# Patient Record
Sex: Female | Born: 1955 | Race: White | Hispanic: No | Marital: Married | Smoking: Former smoker
Health system: Southern US, Community
[De-identification: ages and names within clinical notes are randomized; demographics above are authoritative.]

## PROBLEM LIST (undated history)

## (undated) DIAGNOSIS — F32A Depression, unspecified: Secondary | ICD-10-CM

## (undated) DIAGNOSIS — E119 Type 2 diabetes mellitus without complications: Secondary | ICD-10-CM

## (undated) DIAGNOSIS — K7460 Unspecified cirrhosis of liver: Secondary | ICD-10-CM

## (undated) DIAGNOSIS — M1990 Unspecified osteoarthritis, unspecified site: Secondary | ICD-10-CM

## (undated) DIAGNOSIS — F419 Anxiety disorder, unspecified: Secondary | ICD-10-CM

## (undated) DIAGNOSIS — Z9889 Other specified postprocedural states: Secondary | ICD-10-CM

## (undated) DIAGNOSIS — K219 Gastro-esophageal reflux disease without esophagitis: Secondary | ICD-10-CM

## (undated) DIAGNOSIS — E785 Hyperlipidemia, unspecified: Secondary | ICD-10-CM

## (undated) DIAGNOSIS — F329 Major depressive disorder, single episode, unspecified: Secondary | ICD-10-CM

## (undated) DIAGNOSIS — T8859XA Other complications of anesthesia, initial encounter: Secondary | ICD-10-CM

## (undated) DIAGNOSIS — R112 Nausea with vomiting, unspecified: Secondary | ICD-10-CM

## (undated) DIAGNOSIS — T4145XA Adverse effect of unspecified anesthetic, initial encounter: Secondary | ICD-10-CM

## (undated) HISTORY — DX: Major depressive disorder, single episode, unspecified: F32.9

## (undated) HISTORY — DX: Type 2 diabetes mellitus without complications: E11.9

## (undated) HISTORY — DX: Hyperlipidemia, unspecified: E78.5

## (undated) HISTORY — PX: ENDOMETRIAL ABLATION: SHX621

## (undated) HISTORY — DX: Depression, unspecified: F32.A

## (undated) HISTORY — DX: Anxiety disorder, unspecified: F41.9

---

## 2001-12-31 ENCOUNTER — Ambulatory Visit (HOSPITAL_COMMUNITY): Admission: RE | Admit: 2001-12-31 | Discharge: 2001-12-31 | Payer: Self-pay | Admitting: Family Medicine

## 2001-12-31 ENCOUNTER — Encounter: Payer: Self-pay | Admitting: Family Medicine

## 2002-01-22 ENCOUNTER — Other Ambulatory Visit: Admission: RE | Admit: 2002-01-22 | Discharge: 2002-01-22 | Payer: Self-pay | Admitting: Obstetrics & Gynecology

## 2003-01-02 ENCOUNTER — Ambulatory Visit (HOSPITAL_COMMUNITY): Admission: RE | Admit: 2003-01-02 | Discharge: 2003-01-02 | Payer: Self-pay | Admitting: Family Medicine

## 2003-01-02 ENCOUNTER — Encounter: Payer: Self-pay | Admitting: Family Medicine

## 2003-08-14 ENCOUNTER — Ambulatory Visit (HOSPITAL_COMMUNITY): Admission: RE | Admit: 2003-08-14 | Discharge: 2003-08-14 | Payer: Self-pay | Admitting: Obstetrics & Gynecology

## 2004-01-06 ENCOUNTER — Ambulatory Visit (HOSPITAL_COMMUNITY): Admission: RE | Admit: 2004-01-06 | Discharge: 2004-01-06 | Payer: Self-pay | Admitting: Family Medicine

## 2005-12-29 ENCOUNTER — Ambulatory Visit (HOSPITAL_COMMUNITY): Admission: RE | Admit: 2005-12-29 | Discharge: 2005-12-29 | Payer: Self-pay | Admitting: Family Medicine

## 2007-01-01 ENCOUNTER — Ambulatory Visit (HOSPITAL_COMMUNITY): Admission: RE | Admit: 2007-01-01 | Discharge: 2007-01-01 | Payer: Self-pay | Admitting: Family Medicine

## 2007-05-25 ENCOUNTER — Ambulatory Visit (HOSPITAL_COMMUNITY): Admission: RE | Admit: 2007-05-25 | Discharge: 2007-05-25 | Payer: Self-pay | Admitting: Family Medicine

## 2008-01-02 ENCOUNTER — Ambulatory Visit (HOSPITAL_COMMUNITY): Admission: RE | Admit: 2008-01-02 | Discharge: 2008-01-02 | Payer: Self-pay | Admitting: Family Medicine

## 2009-01-02 ENCOUNTER — Ambulatory Visit (HOSPITAL_COMMUNITY): Admission: RE | Admit: 2009-01-02 | Discharge: 2009-01-02 | Payer: Self-pay | Admitting: Family Medicine

## 2010-01-04 ENCOUNTER — Ambulatory Visit (HOSPITAL_COMMUNITY): Admission: RE | Admit: 2010-01-04 | Discharge: 2010-01-04 | Payer: Self-pay | Admitting: Family Medicine

## 2010-01-13 ENCOUNTER — Ambulatory Visit (HOSPITAL_COMMUNITY): Admission: RE | Admit: 2010-01-13 | Discharge: 2010-01-13 | Payer: Self-pay | Admitting: Family Medicine

## 2010-07-19 ENCOUNTER — Ambulatory Visit: Payer: Self-pay | Admitting: Internal Medicine

## 2010-07-19 DIAGNOSIS — K625 Hemorrhage of anus and rectum: Secondary | ICD-10-CM | POA: Insufficient documentation

## 2010-08-03 ENCOUNTER — Encounter: Payer: Self-pay | Admitting: Internal Medicine

## 2010-08-09 ENCOUNTER — Ambulatory Visit: Payer: Self-pay | Admitting: Internal Medicine

## 2010-08-09 ENCOUNTER — Ambulatory Visit (HOSPITAL_COMMUNITY): Admission: RE | Admit: 2010-08-09 | Discharge: 2010-08-09 | Payer: Self-pay | Admitting: Internal Medicine

## 2010-08-09 HISTORY — PX: COLONOSCOPY: SHX174

## 2010-11-07 ENCOUNTER — Encounter: Payer: Self-pay | Admitting: Family Medicine

## 2010-11-16 NOTE — Letter (Signed)
Summary: [facility] FORM  [facility] FORM   Imported By: Sofie Rower 08/03/2010 15:51:54  _____________________________________________________________________  External Attachment:    Type:   Image     Comment:   External Document  Appended Document: [facility] FORM SAY#[id number]

## 2010-11-16 NOTE — Letter (Signed)
Summary: [facility]CS ORDER  TCS ORDER   Imported By: Sofie Rower 07/19/2010 15:19:15  _____________________________________________________________________  External Attachment:    Type:   Image     Comment:   External Document

## 2010-11-16 NOTE — Assessment & Plan Note (Signed)
Summary: blood from rectum,consult for tcs/ss   Visit Type:  Initial Consult Referring Provider:  Dr Sallee Lange Primary Care Provider:  Dr Sallee Lange  Chief Complaint:  blood from rectum.  History of Present Illness: 56 y/o caucasian female w/ rectal bleeding 2 weeks ago.  Felt wetness in undergarments "like water" bright red without clots, moderate amt, without stool.  Denies hx hemorrhoids, pruiritis, or proctalgia.  Denies diarrhea, constipation, or straining.  Never had colonoscopy.  Denies abd pain, N/V.  Occ heartburn takes omeprazole 41m daily as needed couple times per month or less worse w/ certain spicy foods.  Denies dysphagia or odynophagia.  Wt stable.  Appetite normal.  Denies ASA or NSAIDS.  Current Problems (verified): 1)  Rectal Bleeding  (ICD-569.3)  Current Medications (verified): 1)  Zyrtec Allergy 10 Mg Tabs (Cetirizine Hcl) .... Once Daily 2)  Paxil 30 Mg Tabs (Paroxetine Hcl) .... Once Daily 3)  Simvastatin 20 Mg Tabs (Simvastatin) .... Once Daily 4)  Omeprazole 20 Mg Cpdr (Omeprazole) .... Once Daily  Allergies (verified): 1)  ! Pcn 2)  ! Codeine 3)  Demerol  Past History:  Past Medical History: Hyperlipidemia Depression  Past Surgical History: endometrial ablation benign breast bx Tubal Ligation  Family History: No known family history of colorectal carcinoma, IBD, liver or chronic GI problems in 1st degree relatives.  Paternal grandmother w/ gastric CA ? age.  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 20 pkyr hx, quit 24 yrs ago Alcohol Use - no Illicit Drug Use - no Daily Caffeine Use Patient does not get regular exercise.  Smoking Status:  quit Drug Use:  no Does Patient Exercise:  no  Review of Systems General:  Denies fever, chills,  sweats, anorexia, fatigue, weakness, malaise, weight loss, and sleep disorder. CV:  Denies chest pains, angina, palpitations, syncope, dyspnea on exertion, orthopnea, PND, peripheral edema, and claudication. Resp:  Denies dyspnea at rest, dyspnea with exercise, cough, sputum, wheezing, coughing up blood, and pleurisy. GI:  Denies difficulty swallowing, pain on swallowing, gas/bloating, black BMs, and fecal incontinence. GU:  Complains of urinary incontinence; denies urinary burning, blood in urine, nocturnal urination, and urinary frequency; stress. MS:  Denies joint pain / LOM, joint swelling, joint stiffness, joint deformity, low back pain, muscle weakness, muscle cramps, muscle atrophy, leg pain at night, leg pain with exertion, and shoulder pain / LOM hand / wrist pain (CTS). Derm:  Complains of rash; denies itching, dry skin, hives, moles, warts, and unhealing ulcers; left underarm x 6 weeks being treated by CPearson ForsterNP. Psych:  Denies depression, anxiety, memory loss, suicidal ideation, hallucinations, paranoia, phobia, and confusion. Heme:  Denies bruising, bleeding, and enlarged lymph nodes.  Vital Signs:  Patient profile:   55year old female Height:      67 inches Weight:      197 pounds BMI:     30.97 Temp:     99.0 degrees F oral Pulse rate:   76 / minute BP sitting:   112 / 84  (left arm) Cuff size:   regular  Vitals Entered By: JBurnadette PeterLPN (October  3, 254272:45 PM)  Physical Exam  General:  Well developed, well nourished, no acute distress. Head:  Normocephalic and atraumatic. Eyes:  Sclera clear, no icterus. Ears:  Normal auditory  acuity. Nose:  No deformity, discharge,  or lesions. Mouth:  No deformity or lesions, dentition normal. Neck:  Supple; no masses or thyromegaly.  Erythematous raised rash right cervical neck.  No exudates. Lungs:  Clear throughout to auscultation. Heart:  Regular rate and rhythm; no murmurs, rubs,  or bruits. Abdomen:  Soft,  nontender and nondistended. No masses, hepatosplenomegaly or hernias noted. Normal bowel sounds.without guarding and without rebound.   Rectal:  deferred until time of colonoscopy.   Msk:  Symmetrical with no gross deformities. Normal posture. Pulses:  Normal pulses noted. Extremities:  No clubbing, cyanosis, edema or deformities noted. Neurologic:  Alert and  oriented x4;  grossly normal neurologically. Skin:  Intact without significant lesions or rashes. Cervical Nodes:  No significant cervical adenopathy. Psych:  Alert and cooperative. Normal mood and affect.  Impression & Recommendations:  Problem # 1:  RECTAL BLEEDING (ICD-569.3) 55 y/o caucasian female w/ painless rectal bleeding.  Will need colonoscopy to determine etiology of bleeding.  DIfferentials include colorectal CA, polyps, diverticular bleeding, or benign anorectal source.  ***Pt has NAUSEA with DEMEROL***  Diagnostic colonoscopy to be performed by Dr. Otelia Limes Rourk in the near future.  I have discussed risks and benefits which include, but are not limited to, bleeding, infection, perforation, or medication reaction.  The patient agrees with this plan and consent will be obtained.  Orders: Consultation Level III [id number])

## 2010-12-29 LAB — HEMOGLOBIN AND HEMATOCRIT, BLOOD: Hemoglobin: 13.9 g/dL (ref 12.0–15.0)

## 2011-01-03 ENCOUNTER — Other Ambulatory Visit (HOSPITAL_COMMUNITY): Payer: Self-pay | Admitting: Family Medicine

## 2011-01-03 DIAGNOSIS — N6009 Solitary cyst of unspecified breast: Secondary | ICD-10-CM

## 2011-01-03 DIAGNOSIS — Z139 Encounter for screening, unspecified: Secondary | ICD-10-CM

## 2011-01-19 ENCOUNTER — Ambulatory Visit (HOSPITAL_COMMUNITY)
Admission: RE | Admit: 2011-01-19 | Discharge: 2011-01-19 | Disposition: A | Discharge status: Home / Self Care | Payer: BC Managed Care – PPO | Source: Ambulatory Visit | Attending: Family Medicine | Admitting: Family Medicine

## 2011-01-19 DIAGNOSIS — N6009 Solitary cyst of unspecified breast: Secondary | ICD-10-CM

## 2011-01-19 DIAGNOSIS — Z09 Encounter for follow-up examination after completed treatment for conditions other than malignant neoplasm: Secondary | ICD-10-CM | POA: Insufficient documentation

## 2011-01-19 DIAGNOSIS — N63 Unspecified lump in unspecified breast: Secondary | ICD-10-CM | POA: Insufficient documentation

## 2011-05-06 ENCOUNTER — Other Ambulatory Visit: Payer: Self-pay | Admitting: Nurse Practitioner

## 2011-05-06 DIAGNOSIS — R609 Edema, unspecified: Secondary | ICD-10-CM

## 2011-05-06 DIAGNOSIS — R52 Pain, unspecified: Secondary | ICD-10-CM

## 2011-05-10 ENCOUNTER — Ambulatory Visit (HOSPITAL_COMMUNITY)
Admission: RE | Admit: 2011-05-10 | Discharge: 2011-05-10 | Disposition: A | Discharge status: Home / Self Care | Payer: BC Managed Care – PPO | Source: Ambulatory Visit | Attending: Nurse Practitioner | Admitting: Nurse Practitioner

## 2011-05-10 ENCOUNTER — Other Ambulatory Visit: Payer: Self-pay | Admitting: Nurse Practitioner

## 2011-05-10 DIAGNOSIS — R599 Enlarged lymph nodes, unspecified: Secondary | ICD-10-CM | POA: Insufficient documentation

## 2011-05-10 DIAGNOSIS — R070 Pain in throat: Secondary | ICD-10-CM | POA: Insufficient documentation

## 2011-05-10 DIAGNOSIS — R52 Pain, unspecified: Secondary | ICD-10-CM

## 2011-05-10 DIAGNOSIS — R220 Localized swelling, mass and lump, head: Secondary | ICD-10-CM | POA: Insufficient documentation

## 2011-05-10 DIAGNOSIS — R609 Edema, unspecified: Secondary | ICD-10-CM

## 2011-10-20 ENCOUNTER — Other Ambulatory Visit: Payer: Self-pay | Admitting: Family Medicine

## 2011-10-20 DIAGNOSIS — E041 Nontoxic single thyroid nodule: Secondary | ICD-10-CM

## 2011-11-11 ENCOUNTER — Ambulatory Visit (HOSPITAL_COMMUNITY)
Admission: RE | Admit: 2011-11-11 | Discharge: 2011-11-11 | Disposition: A | Discharge status: Home / Self Care | Payer: BC Managed Care – PPO | Source: Ambulatory Visit | Attending: Family Medicine | Admitting: Family Medicine

## 2011-11-11 DIAGNOSIS — E041 Nontoxic single thyroid nodule: Secondary | ICD-10-CM

## 2011-11-11 DIAGNOSIS — E049 Nontoxic goiter, unspecified: Secondary | ICD-10-CM | POA: Insufficient documentation

## 2011-11-24 ENCOUNTER — Other Ambulatory Visit: Payer: Self-pay | Admitting: Family Medicine

## 2011-11-24 DIAGNOSIS — Z139 Encounter for screening, unspecified: Secondary | ICD-10-CM

## 2012-01-23 ENCOUNTER — Ambulatory Visit (HOSPITAL_COMMUNITY)
Admission: RE | Admit: 2012-01-23 | Discharge: 2012-01-23 | Disposition: A | Discharge status: Home / Self Care | Payer: BC Managed Care – PPO | Source: Ambulatory Visit | Attending: Family Medicine | Admitting: Family Medicine

## 2012-01-23 DIAGNOSIS — Z1231 Encounter for screening mammogram for malignant neoplasm of breast: Secondary | ICD-10-CM | POA: Insufficient documentation

## 2012-01-23 DIAGNOSIS — Z139 Encounter for screening, unspecified: Secondary | ICD-10-CM

## 2012-06-11 ENCOUNTER — Other Ambulatory Visit: Payer: Self-pay | Admitting: Family Medicine

## 2012-06-11 DIAGNOSIS — R52 Pain, unspecified: Secondary | ICD-10-CM

## 2012-06-12 ENCOUNTER — Ambulatory Visit (HOSPITAL_COMMUNITY)
Admission: RE | Admit: 2012-06-12 | Discharge: 2012-06-12 | Disposition: A | Discharge status: Home / Self Care | Payer: BC Managed Care – PPO | Source: Ambulatory Visit | Attending: Family Medicine | Admitting: Family Medicine

## 2012-06-12 ENCOUNTER — Other Ambulatory Visit: Payer: Self-pay | Admitting: Family Medicine

## 2012-06-12 ENCOUNTER — Other Ambulatory Visit (HOSPITAL_COMMUNITY): Payer: BC Managed Care – PPO

## 2012-06-12 DIAGNOSIS — N854 Malposition of uterus: Secondary | ICD-10-CM | POA: Insufficient documentation

## 2012-06-12 DIAGNOSIS — M545 Low back pain, unspecified: Secondary | ICD-10-CM

## 2012-06-12 DIAGNOSIS — M51379 Other intervertebral disc degeneration, lumbosacral region without mention of lumbar back pain or lower extremity pain: Secondary | ICD-10-CM | POA: Insufficient documentation

## 2012-06-12 DIAGNOSIS — R52 Pain, unspecified: Secondary | ICD-10-CM

## 2012-06-12 DIAGNOSIS — R109 Unspecified abdominal pain: Secondary | ICD-10-CM | POA: Insufficient documentation

## 2012-06-12 DIAGNOSIS — M5137 Other intervertebral disc degeneration, lumbosacral region: Secondary | ICD-10-CM | POA: Insufficient documentation

## 2013-01-03 ENCOUNTER — Other Ambulatory Visit: Payer: Self-pay | Admitting: Family Medicine

## 2013-01-03 DIAGNOSIS — Z139 Encounter for screening, unspecified: Secondary | ICD-10-CM

## 2013-01-26 ENCOUNTER — Other Ambulatory Visit: Payer: Self-pay | Admitting: Nurse Practitioner

## 2013-01-28 ENCOUNTER — Ambulatory Visit (HOSPITAL_COMMUNITY)
Admission: RE | Admit: 2013-01-28 | Discharge: 2013-01-28 | Disposition: A | Discharge status: Home / Self Care | Payer: BC Managed Care – PPO | Source: Ambulatory Visit | Attending: Family Medicine | Admitting: Family Medicine

## 2013-01-28 DIAGNOSIS — Z139 Encounter for screening, unspecified: Secondary | ICD-10-CM

## 2013-01-28 DIAGNOSIS — Z1231 Encounter for screening mammogram for malignant neoplasm of breast: Secondary | ICD-10-CM | POA: Insufficient documentation

## 2013-02-01 ENCOUNTER — Encounter: Payer: Self-pay | Admitting: *Deleted

## 2013-02-11 ENCOUNTER — Encounter: Payer: Self-pay | Admitting: Nurse Practitioner

## 2013-02-14 ENCOUNTER — Ambulatory Visit (INDEPENDENT_AMBULATORY_CARE_PROVIDER_SITE_OTHER): Payer: BC Managed Care – PPO | Admitting: Nurse Practitioner

## 2013-02-14 ENCOUNTER — Telehealth: Payer: Self-pay | Admitting: *Deleted

## 2013-02-14 ENCOUNTER — Encounter: Payer: Self-pay | Admitting: Nurse Practitioner

## 2013-02-14 VITALS — BP 140/78 | HR 70 | Ht 64.0 in | Wt 194.0 lb

## 2013-02-14 DIAGNOSIS — T148XXA Other injury of unspecified body region, initial encounter: Secondary | ICD-10-CM

## 2013-02-14 DIAGNOSIS — R32 Unspecified urinary incontinence: Secondary | ICD-10-CM

## 2013-02-14 DIAGNOSIS — Z01419 Encounter for gynecological examination (general) (routine) without abnormal findings: Secondary | ICD-10-CM

## 2013-02-14 DIAGNOSIS — A499 Bacterial infection, unspecified: Secondary | ICD-10-CM

## 2013-02-14 DIAGNOSIS — E785 Hyperlipidemia, unspecified: Secondary | ICD-10-CM

## 2013-02-14 DIAGNOSIS — Z0000 Encounter for general adult medical examination without abnormal findings: Secondary | ICD-10-CM

## 2013-02-14 DIAGNOSIS — R7301 Impaired fasting glucose: Secondary | ICD-10-CM

## 2013-02-14 DIAGNOSIS — Z79899 Other long term (current) drug therapy: Secondary | ICD-10-CM

## 2013-02-14 DIAGNOSIS — N760 Acute vaginitis: Secondary | ICD-10-CM

## 2013-02-14 DIAGNOSIS — B9689 Other specified bacterial agents as the cause of diseases classified elsewhere: Secondary | ICD-10-CM

## 2013-02-14 MED ORDER — PAROXETINE HCL 40 MG PO TABS: 40.0000 mg | ORAL_TABLET | ORAL | Status: DC

## 2013-02-14 MED ORDER — METRONIDAZOLE 500 MG PO TABS: ORAL_TABLET | ORAL | Status: DC

## 2013-02-14 MED ORDER — CEPHALEXIN 500 MG PO CAPS: 500.0000 mg | ORAL_CAPSULE | Freq: Three times a day (TID) | ORAL | Status: AC

## 2013-02-14 NOTE — Patient Instructions (Signed)
Zaditor eye drops; Claritin in AM Benadryl PM; Nasacort AQ

## 2013-02-14 NOTE — Telephone Encounter (Signed)
Called pt number, no answer

## 2013-02-15 ENCOUNTER — Encounter: Payer: Self-pay | Admitting: Nurse Practitioner

## 2013-02-15 DIAGNOSIS — N3946 Mixed incontinence: Secondary | ICD-10-CM | POA: Insufficient documentation

## 2013-02-15 DIAGNOSIS — E785 Hyperlipidemia, unspecified: Secondary | ICD-10-CM | POA: Insufficient documentation

## 2013-02-15 DIAGNOSIS — R32 Unspecified urinary incontinence: Secondary | ICD-10-CM | POA: Insufficient documentation

## 2013-02-15 NOTE — Progress Notes (Signed)
  Subjective:    Patient ID: Catherine Moore, female    DOB: Jun 20, 1956, 57 y.o.   MRN: [id number]  HPI presents for a wellness checkup. Patient increased her Paxil to 40 mg which has helped her anxiety and depression. Same sexual partner. No fever pelvic pain. Slight white discharge, no odor itching or burning. Gets regular eye exams and dental care. Has a slight sore area in the middle of her back for the past few days. Also has noticed occasional incontinence of urgency and stress. Does not occur all the time. Is interested in medication to help with this.    Review of Systems  Constitutional: Positive for fatigue. Negative for activity change and appetite change.  Respiratory: Negative for chest tightness and shortness of breath.   Cardiovascular: Negative for chest pain.  Gastrointestinal: Negative for abdominal pain, diarrhea and constipation.  Genitourinary: Positive for vaginal discharge. Negative for dysuria, urgency, frequency, vaginal bleeding, difficulty urinating, menstrual problem and pelvic pain.  Psychiatric/Behavioral: The patient is nervous/anxious.        Objective:   Physical Exam  Constitutional: She is oriented to person, place, and time. She appears well-developed. No distress.  HENT:  Right Ear: External ear normal.  Left Ear: External ear normal.  Mouth/Throat: Oropharynx is clear and moist.  Neck: Normal range of motion. Neck supple. No tracheal deviation present. No thyromegaly present.  Cardiovascular: Normal rate, regular rhythm and normal heart sounds.  Exam reveals no gallop.   No murmur heard. Pulmonary/Chest: Effort normal and breath sounds normal.  Abdominal: Soft. She exhibits no distension. There is no tenderness.  Genitourinary: Vagina normal and uterus normal. No vaginal discharge found.  Musculoskeletal: She exhibits no edema.  Lymphadenopathy:    She has no cervical adenopathy.  Neurological: She is alert and oriented to person, place, and time.   Skin: Skin is warm and dry. No rash noted.  Psychiatric: She has a normal mood and affect. Her behavior is normal.   breast exam normal, axilla no adenopathy. EGBUS normal limit. Vagina very minimal slightly yellowish discharge noted. No CMT. Uterus and adnexa normal limit and nontender, exam limited due to abdominal girth. Rectal exam normal limit, no stool for Hemoccult. A small slightly raised pink lesion with this tiny superficial abrasion most likely from excoriation noted in the mid back area, minimally tender to palpation. No active discharge.       Assessment & Plan:  Well woman exam  Bacterial vaginosis  Impaired fasting glucose - Plan: Basic metabolic panel, Basic metabolic panel  High risk medication use - Plan: Hepatic function panel, Hepatic function panel  Hyperlipemia - Plan: Lipid panel, Lipid panel  Superficial abrasion  Metronidazole as directed. Keflex as directed. New prescription for Paxil 40 mg daily. Lab work ordered. Recheck in a few months, call back sooner if any problems. Defers any STD testing.

## 2013-02-15 NOTE — Assessment & Plan Note (Signed)
Labs pending.  

## 2013-02-15 NOTE — Assessment & Plan Note (Addendum)
Symptoms are mild at this point. Patient wishes to try a patch. Recommend Oxytrol OTC patches as directed.

## 2013-02-15 NOTE — Assessment & Plan Note (Signed)
Continue pravastatin 80 mg daily.

## 2013-03-18 ENCOUNTER — Other Ambulatory Visit: Payer: Self-pay | Admitting: Nurse Practitioner

## 2013-03-18 ENCOUNTER — Other Ambulatory Visit: Payer: Self-pay | Admitting: Family Medicine

## 2013-03-19 NOTE — Telephone Encounter (Signed)
Ok times one. Must have chronic ov bef any furhter

## 2013-03-19 NOTE — Telephone Encounter (Signed)
Was told to return in three mo, did not, will need to be seen before further

## 2013-03-19 NOTE — Telephone Encounter (Signed)
Ok times one, must have chronic ov before any further meds rxed

## 2013-03-20 ENCOUNTER — Other Ambulatory Visit: Payer: Self-pay | Admitting: Nurse Practitioner

## 2013-03-25 ENCOUNTER — Other Ambulatory Visit: Payer: Self-pay | Admitting: Nurse Practitioner

## 2013-03-25 NOTE — Telephone Encounter (Signed)
Ok times one. Will need an o v with me specifically on this before more written

## 2013-03-25 NOTE — Telephone Encounter (Signed)
Last filled on 01/31/13 with one additional refill.

## 2013-03-28 ENCOUNTER — Other Ambulatory Visit: Payer: Self-pay | Admitting: Nurse Practitioner

## 2013-03-28 MED ORDER — HYDROCODONE-ACETAMINOPHEN 5-325 MG PO TABS: ORAL_TABLET | ORAL | Status: DC

## 2013-04-18 ENCOUNTER — Other Ambulatory Visit: Payer: Self-pay | Admitting: Family Medicine

## 2013-04-18 NOTE — Telephone Encounter (Signed)
Ok plus two monthly ref

## 2013-05-03 NOTE — Progress Notes (Signed)
Labs not done; patient has lost her insurance

## 2013-05-17 ENCOUNTER — Other Ambulatory Visit: Payer: Self-pay | Admitting: Nurse Practitioner

## 2013-07-18 ENCOUNTER — Other Ambulatory Visit: Payer: Self-pay | Admitting: Family Medicine

## 2013-07-18 NOTE — Telephone Encounter (Signed)
May ref times one. 6 mo f u with myself or carolyn in next few wks

## 2013-07-19 ENCOUNTER — Other Ambulatory Visit: Payer: Self-pay | Admitting: *Deleted

## 2013-07-19 MED ORDER — ALPRAZOLAM 1 MG PO TABS: ORAL_TABLET | ORAL | Status: DC

## 2013-08-19 ENCOUNTER — Telehealth: Payer: Self-pay | Admitting: Nurse Practitioner

## 2013-08-19 ENCOUNTER — Other Ambulatory Visit: Payer: Self-pay | Admitting: Nurse Practitioner

## 2013-08-19 MED ORDER — ALPRAZOLAM 1 MG PO TABS: ORAL_TABLET | ORAL | Status: DC

## 2013-08-19 MED ORDER — HYDROCODONE-ACETAMINOPHEN 5-325 MG PO TABS: ORAL_TABLET | ORAL | Status: DC

## 2013-08-19 NOTE — Telephone Encounter (Signed)
I will give 30 day Rx of each. I know insurance is an issue but our office policy is visit every 3 months for controlled meds.

## 2013-08-19 NOTE — Telephone Encounter (Signed)
Hydrocodone and xanax refill... Going to a new pharmacy please write both scripts

## 2013-08-19 NOTE — Telephone Encounter (Signed)
Discussed with patient. Scripts ready for pick up.

## 2013-09-09 ENCOUNTER — Other Ambulatory Visit: Payer: Self-pay | Admitting: *Deleted

## 2013-09-09 ENCOUNTER — Other Ambulatory Visit: Payer: Self-pay | Admitting: Nurse Practitioner

## 2013-09-09 MED ORDER — PAROXETINE HCL 40 MG PO TABS: 40.0000 mg | ORAL_TABLET | ORAL | Status: DC

## 2013-09-26 ENCOUNTER — Encounter: Payer: Self-pay | Admitting: Nurse Practitioner

## 2013-09-26 ENCOUNTER — Ambulatory Visit (INDEPENDENT_AMBULATORY_CARE_PROVIDER_SITE_OTHER): Payer: Self-pay | Admitting: Nurse Practitioner

## 2013-09-26 VITALS — BP 122/78 | Ht 64.0 in | Wt 204.4 lb

## 2013-09-26 DIAGNOSIS — F32A Depression, unspecified: Secondary | ICD-10-CM

## 2013-09-26 DIAGNOSIS — F329 Major depressive disorder, single episode, unspecified: Secondary | ICD-10-CM

## 2013-09-26 DIAGNOSIS — F341 Dysthymic disorder: Secondary | ICD-10-CM

## 2013-09-26 DIAGNOSIS — Z5189 Encounter for other specified aftercare: Secondary | ICD-10-CM

## 2013-09-26 DIAGNOSIS — E785 Hyperlipidemia, unspecified: Secondary | ICD-10-CM

## 2013-09-26 DIAGNOSIS — Z79899 Other long term (current) drug therapy: Secondary | ICD-10-CM

## 2013-09-26 DIAGNOSIS — K219 Gastro-esophageal reflux disease without esophagitis: Secondary | ICD-10-CM

## 2013-09-26 DIAGNOSIS — R52 Pain, unspecified: Secondary | ICD-10-CM

## 2013-09-26 MED ORDER — HYDROCODONE-ACETAMINOPHEN 5-325 MG PO TABS: ORAL_TABLET | ORAL | Status: DC

## 2013-09-26 MED ORDER — ALPRAZOLAM 1 MG PO TABS: ORAL_TABLET | ORAL | Status: DC

## 2013-09-26 MED ORDER — OMEPRAZOLE 20 MG PO CPDR: 20.0000 mg | DELAYED_RELEASE_CAPSULE | Freq: Two times a day (BID) | ORAL | Status: DC

## 2013-09-26 MED ORDER — PRAVASTATIN SODIUM 80 MG PO TABS: ORAL_TABLET | ORAL | Status: DC

## 2013-09-26 MED ORDER — PAROXETINE HCL 40 MG PO TABS: 40.0000 mg | ORAL_TABLET | ORAL | Status: DC

## 2013-09-26 NOTE — Patient Instructions (Signed)
Align Nasacort AQ

## 2013-09-27 ENCOUNTER — Encounter: Payer: Self-pay | Admitting: Nurse Practitioner

## 2013-09-27 DIAGNOSIS — R52 Pain, unspecified: Secondary | ICD-10-CM | POA: Insufficient documentation

## 2013-09-27 DIAGNOSIS — F329 Major depressive disorder, single episode, unspecified: Secondary | ICD-10-CM | POA: Insufficient documentation

## 2013-09-27 DIAGNOSIS — K219 Gastro-esophageal reflux disease without esophagitis: Secondary | ICD-10-CM | POA: Insufficient documentation

## 2013-09-27 DIAGNOSIS — F32A Depression, unspecified: Secondary | ICD-10-CM | POA: Insufficient documentation

## 2013-09-27 NOTE — Assessment & Plan Note (Signed)
.   HYDROcodone-acetaminophen (NORCO/VICODIN) 5-325 MG per tablet    Sig: TAKE ONE TABLET BY MOUTH EVERY 4 TO 6 HOURS AS NEEDED FOR PAIN.    Dispense:  45 tablet    Refill:  0    May fill 60 days from 09/26/13    Order Specific Question:  Supervising Provider    Answer:  Mikey Kirschner [[id number]]  . omeprazole (PRILOSEC) 20 MG capsule    Sig: Take 1 capsule (20 mg total) by mouth 2 (two) times daily before a meal.    Dispense:  180 capsule    Refill:  1    Order Specific Question:  Supervising Provider    Answer:  Mikey Kirschner [[id number]]  . PARoxetine (PAXIL) 40 MG tablet    Sig: Take 1 tablet (40 mg total) by mouth every morning.    Dispense:  90 tablet    Refill:  1    Order Specific Question:  Supervising Provider    Answer:  Mikey Kirschner [[id number]]  . pravastatin (PRAVACHOL) 80 MG tablet    Sig: TAKE ONE TABLET BY MOUTH ONCE DAILY FOR CHOLESTEROL    Dispense:  90 tablet    Refill:  1    Order Specific Question:  Supervising Provider    Answer:  Mikey Kirschner [[id number]]   Restart pravastatin as directed. Repeat labs in 3 months. Discussed importance of getting  LDL under good control. slowly decrease caffeine intake. Recheck in 3 months, call back sooner if any problems.

## 2013-09-27 NOTE — Assessment & Plan Note (Signed)
Continue to work on weight loss. Slowly decrease caffeine intake. Increase omeprazole 20 mg to twice a day.

## 2013-09-27 NOTE — Assessment & Plan Note (Signed)
Given 3 separate monthly prescriptions for her pain medication, given 15 more per month to cover occasional days where she has to take 2 pills. Recheck in 3 months.

## 2013-09-27 NOTE — Progress Notes (Signed)
Subjective:  Presents for routine followup. Takes hydrocodone once a day for her back and foot pain. Symptoms have been worse with her new job where she is standing all day at work. Will take a second dose several days of the week if pain is worse. Pain is 5/10 on pain scale, after medication pain is completely resolved. Uses Xanax at bedtime for sleep which is working well. Takes occasional dose for panic attacks/palpitations during the day. Drinks a lot of caffeine. Nonsmoker. No alcohol use. No excessive NSAID use. Has had 3 episodes of choking and gagging feeling like something is stuck in her throat. These were isolated  symptoms, is not a constant problem. Has noticed increase in her reflux symptoms lately. No abdominal pain. Has been working on her weight. Has had 3 episodes of urgent incontinent diarrhea over the past 3 months, no blood in her stool. No constipation. Bowels are normal in between these times. Has stopped her pravastatin. No chest pain shortness of breath or edema.  Objective:   BP 122/78  Ht 5' 4"  (1.626 m)  Wt 204 lb 6.4 oz (92.715 kg)  BMI 35.07 kg/m2 NAD. Alert, oriented. TMs minimal clear effusion, no erythema. Pharynx mildly injected, clear PND noted. Neck supple with mild soft nontender adenopathy. Lungs clear. Heart regular rate rhythm. Thyroid normal limit to palpation and nontender. Abdomen soft nondistended nontender.  Assessment:Other and unspecified hyperlipidemia - Plan: Lipid panel  High risk medication use - Plan: Hepatic function panel  Pain management  GERD (gastroesophageal reflux disease)  Anxiety and depression  Plan: Meds ordered this encounter  Medications  . ALPRAZolam (XANAX) 1 MG tablet    Sig: TAKE (1/2) TO 1 TABLET BY MOUTH TWICE DAILY AS NEEDED.    Dispense:  40 tablet    Refill:  5    May refill monthly    Order Specific Question:  Supervising Provider    Answer:  Mikey Kirschner [[id number]]  . DISCONTD: HYDROcodone-acetaminophen  (NORCO/VICODIN) 5-325 MG per tablet    Sig: TAKE ONE TABLET BY MOUTH EVERY 4 TO 6 HOURS AS NEEDED FOR PAIN.    Dispense:  45 tablet    Refill:  0    Order Specific Question:  Supervising Provider    Answer:  Mikey Kirschner [[id number]]  . DISCONTD: HYDROcodone-acetaminophen (NORCO/VICODIN) 5-325 MG per tablet    Sig: TAKE ONE TABLET BY MOUTH EVERY 4 TO 6 HOURS AS NEEDED FOR PAIN.    Dispense:  45 tablet    Refill:  0    May fill 30 days from 09/26/13    Order Specific Question:  Supervising Provider    Answer:  Mikey Kirschner [[id number]]  . HYDROcodone-acetaminophen (NORCO/VICODIN) 5-325 MG per tablet    Sig: TAKE ONE TABLET BY MOUTH EVERY 4 TO 6 HOURS AS NEEDED FOR PAIN.    Dispense:  45 tablet    Refill:  0    May fill 60 days from 09/26/13    Order Specific Question:  Supervising Provider    Answer:  Mikey Kirschner [[id number]]  . omeprazole (PRILOSEC) 20 MG capsule    Sig: Take 1 capsule (20 mg total) by mouth 2 (two) times daily before a meal.    Dispense:  180 capsule    Refill:  1    Order Specific Question:  Supervising Provider    Answer:  Mikey Kirschner [[id number]]  . PARoxetine (PAXIL) 40 MG tablet    Sig: Take 1 tablet (40 mg total)  by mouth every morning.    Dispense:  90 tablet    Refill:  1    Order Specific Question:  Supervising Provider    Answer:  Mikey Kirschner [[id number]]  . pravastatin (PRAVACHOL) 80 MG tablet    Sig: TAKE ONE TABLET BY MOUTH ONCE DAILY FOR CHOLESTEROL    Dispense:  90 tablet    Refill:  1    Order Specific Question:  Supervising Provider    Answer:  Mikey Kirschner [[id number]]   Restart pravastatin as directed. Repeat labs in 3 months. Discussed importance of getting  LDL under good control. slowly decrease caffeine intake. Recheck in 3 months, call back sooner if any problems.

## 2013-09-27 NOTE — Assessment & Plan Note (Signed)
Continue Paxil and Xanax as directed.

## 2013-12-09 ENCOUNTER — Other Ambulatory Visit: Payer: Self-pay | Admitting: Family Medicine

## 2013-12-26 ENCOUNTER — Ambulatory Visit (INDEPENDENT_AMBULATORY_CARE_PROVIDER_SITE_OTHER): Payer: BC Managed Care – PPO | Admitting: Nurse Practitioner

## 2013-12-26 ENCOUNTER — Encounter: Payer: Self-pay | Admitting: Nurse Practitioner

## 2013-12-26 VITALS — BP 122/74 | Ht 65.0 in | Wt 205.0 lb

## 2013-12-26 DIAGNOSIS — J300 Vasomotor rhinitis: Secondary | ICD-10-CM

## 2013-12-26 DIAGNOSIS — M65849 Other synovitis and tenosynovitis, unspecified hand: Secondary | ICD-10-CM

## 2013-12-26 DIAGNOSIS — R52 Pain, unspecified: Secondary | ICD-10-CM

## 2013-12-26 DIAGNOSIS — F341 Dysthymic disorder: Secondary | ICD-10-CM

## 2013-12-26 DIAGNOSIS — M779 Enthesopathy, unspecified: Secondary | ICD-10-CM

## 2013-12-26 DIAGNOSIS — J309 Allergic rhinitis, unspecified: Secondary | ICD-10-CM

## 2013-12-26 DIAGNOSIS — Z5189 Encounter for other specified aftercare: Secondary | ICD-10-CM

## 2013-12-26 DIAGNOSIS — F32A Depression, unspecified: Secondary | ICD-10-CM

## 2013-12-26 DIAGNOSIS — K219 Gastro-esophageal reflux disease without esophagitis: Secondary | ICD-10-CM

## 2013-12-26 DIAGNOSIS — M65839 Other synovitis and tenosynovitis, unspecified forearm: Secondary | ICD-10-CM

## 2013-12-26 DIAGNOSIS — F329 Major depressive disorder, single episode, unspecified: Secondary | ICD-10-CM

## 2013-12-26 DIAGNOSIS — F419 Anxiety disorder, unspecified: Secondary | ICD-10-CM

## 2013-12-26 DIAGNOSIS — M778 Other enthesopathies, not elsewhere classified: Secondary | ICD-10-CM

## 2013-12-26 DIAGNOSIS — E785 Hyperlipidemia, unspecified: Secondary | ICD-10-CM

## 2013-12-26 MED ORDER — AZITHROMYCIN 250 MG PO TABS: ORAL_TABLET | ORAL | Status: DC

## 2013-12-26 MED ORDER — PHENTERMINE HCL 37.5 MG PO TABS: 37.5000 mg | ORAL_TABLET | Freq: Every day | ORAL | Status: DC

## 2013-12-26 MED ORDER — HYDROCODONE-ACETAMINOPHEN 10-325 MG PO TABS: 1.0000 | ORAL_TABLET | Freq: Four times a day (QID) | ORAL | Status: DC | PRN

## 2013-12-26 MED ORDER — FLUTICASONE PROPIONATE 50 MCG/ACT NA SUSP: 2.0000 | Freq: Every day | NASAL | Status: DC

## 2013-12-26 MED ORDER — CETIRIZINE HCL 10 MG PO TABS: 10.0000 mg | ORAL_TABLET | Freq: Every day | ORAL | Status: DC

## 2013-12-30 ENCOUNTER — Encounter: Payer: Self-pay | Admitting: Nurse Practitioner

## 2013-12-30 NOTE — Progress Notes (Signed)
Subjective:  Presents for routine followup. Started her new job. Has to stand in one place for hours at a time. Working 40 hours per week. Has caused an increase in her chronic back pain. Hydrocodone 5 mg not working as well. Has also taken naproxen in the past. Plans to restart her walking program with improved weather. Also complaints of pain at the base of both thumbs worse with movement. Has a wrist brace at home. No weakness or numbness of the hands. Also complaints of ear pressure. No fever. Minimal cough. Mild head congestion. No sinus headache. Clear mucus. Mildly irritated throat. No wheezing. Reflux stable on omeprazole. Paxil working well for her depression and anxiety. Would like to restart phentermine to help her with her weight loss. Has been off this for more than 6 months. Has taken without difficulty in the past.  Objective:   BP 122/74  Ht 5' 5"  (1.651 m)  Wt 205 lb (92.987 kg)  BMI 34.11 kg/m2 NAD. Alert, oriented. TMs clear effusion, no erythema. Nasal mucosa pale and boggy. Pharynx injected with clear PND noted. Neck supple with mild soft anterior adenopathy. Lungs clear. Heart regular rate rhythm. Abdomen soft nondistended nontender. Tenderness noted at the base of both thumbs. Good ROM. Hand strength 5+ bilateral.  Assessment:  Problem List Items Addressed This Visit     Digestive   GERD (gastroesophageal reflux disease) - Primary     Other   Hyperlipemia   Pain management   Anxiety and depression   Morbid obesity   Relevant Medications      phentermine (ADIPEX-P) 37.5 MG tablet    Other Visit Diagnoses   Vasomotor rhinitis        Thumb tendonitis           Plan:  Meds ordered this encounter  Medications  . fluticasone (FLONASE) 50 MCG/ACT nasal spray    Sig: Place 2 sprays into both nostrils daily.    Dispense:  16 g    Refill:  11    Order Specific Question:  Supervising Provider    Answer:  Mikey Kirschner [[id number]]  . azithromycin (ZITHROMAX Z-PAK) 250  MG tablet    Sig: Take 2 tablets (500 mg) on  Day 1,  followed by 1 tablet (250 mg) once daily on Days 2 through 5.    Dispense:  6 each    Refill:  0    Order Specific Question:  Supervising Provider    Answer:  Mikey Kirschner [[id number]]  . phentermine (ADIPEX-P) 37.5 MG tablet    Sig: Take 1 tablet (37.5 mg total) by mouth daily before breakfast.    Dispense:  30 tablet    Refill:  2    Order Specific Question:  Supervising Provider    Answer:  Mikey Kirschner [[id number]]  . DISCONTD: HYDROcodone-acetaminophen (NORCO) 10-325 MG per tablet    Sig: Take 1 tablet by mouth every 6 (six) hours as needed for severe pain.    Dispense:  45 tablet    Refill:  0    Order Specific Question:  Supervising Provider    Answer:  Mikey Kirschner [[id number]]  . DISCONTD: HYDROcodone-acetaminophen (NORCO) 10-325 MG per tablet    Sig: Take 1 tablet by mouth every 6 (six) hours as needed for severe pain.    Dispense:  45 tablet    Refill:  0    May fill 30 days from 12/26/13    Order Specific Question:  Supervising Provider  Answer:  Mikey Kirschner [[id number]]  . HYDROcodone-acetaminophen (NORCO) 10-325 MG per tablet    Sig: Take 1 tablet by mouth every 6 (six) hours as needed for severe pain.    Dispense:  45 tablet    Refill:  0    May fill 60 days from 12/26/13    Order Specific Question:  Supervising Provider    Answer:  Mikey Kirschner [[id number]]  . cetirizine (ZYRTEC) 10 MG tablet    Sig: Take 1 tablet (10 mg total) by mouth daily.    Dispense:  30 tablet    Refill:  11   will increase hydrocodone to 10 mg but continue same number per month. Began walking program. Healthy diet. Phentermine for weight loss. Recommend referral to specialist for thumb tendinitis, patient given information. Recheck in 3 months, call back sooner if any problems. Routine labs to at next visit.

## 2014-01-16 ENCOUNTER — Telehealth: Payer: Self-pay | Admitting: Family Medicine

## 2014-01-16 NOTE — Telephone Encounter (Signed)
Patient called to check on her referral to Dr Nelva Bush for the shots in her hands. I didn't see a referral put in. Can you put one in?

## 2014-01-17 ENCOUNTER — Other Ambulatory Visit: Payer: Self-pay | Admitting: Nurse Practitioner

## 2014-01-17 DIAGNOSIS — M779 Enthesopathy, unspecified: Principal | ICD-10-CM

## 2014-01-17 DIAGNOSIS — M778 Other enthesopathies, not elsewhere classified: Secondary | ICD-10-CM

## 2014-01-17 NOTE — Telephone Encounter (Signed)
Notified patient via VM stating we put referral in

## 2014-01-17 NOTE — Telephone Encounter (Signed)
I apologize. Did not know we needed to do referral. Will send in request.

## 2014-03-13 ENCOUNTER — Other Ambulatory Visit: Payer: Self-pay | Admitting: Family Medicine

## 2014-03-27 ENCOUNTER — Encounter: Payer: Self-pay | Admitting: Nurse Practitioner

## 2014-03-27 ENCOUNTER — Ambulatory Visit (INDEPENDENT_AMBULATORY_CARE_PROVIDER_SITE_OTHER): Payer: BC Managed Care – PPO | Admitting: Nurse Practitioner

## 2014-03-27 VITALS — BP 146/86 | Ht 65.0 in | Wt 186.0 lb

## 2014-03-27 DIAGNOSIS — Z79899 Other long term (current) drug therapy: Secondary | ICD-10-CM

## 2014-03-27 DIAGNOSIS — F329 Major depressive disorder, single episode, unspecified: Secondary | ICD-10-CM

## 2014-03-27 DIAGNOSIS — E785 Hyperlipidemia, unspecified: Secondary | ICD-10-CM

## 2014-03-27 DIAGNOSIS — F32A Depression, unspecified: Secondary | ICD-10-CM

## 2014-03-27 DIAGNOSIS — R7301 Impaired fasting glucose: Secondary | ICD-10-CM

## 2014-03-27 DIAGNOSIS — Z5189 Encounter for other specified aftercare: Secondary | ICD-10-CM

## 2014-03-27 DIAGNOSIS — F419 Anxiety disorder, unspecified: Secondary | ICD-10-CM

## 2014-03-27 DIAGNOSIS — K219 Gastro-esophageal reflux disease without esophagitis: Secondary | ICD-10-CM

## 2014-03-27 DIAGNOSIS — F341 Dysthymic disorder: Secondary | ICD-10-CM

## 2014-03-27 DIAGNOSIS — R52 Pain, unspecified: Secondary | ICD-10-CM

## 2014-03-27 LAB — POCT GLYCOSYLATED HEMOGLOBIN (HGB A1C): Hemoglobin A1C: 5.7

## 2014-03-27 MED ORDER — PRAVASTATIN SODIUM 80 MG PO TABS: ORAL_TABLET | ORAL | Status: DC

## 2014-03-27 MED ORDER — METHOCARBAMOL 750 MG PO TABS: 750.0000 mg | ORAL_TABLET | Freq: Three times a day (TID) | ORAL | Status: DC | PRN

## 2014-03-27 MED ORDER — HYDROCODONE-ACETAMINOPHEN 10-325 MG PO TABS: 1.0000 | ORAL_TABLET | Freq: Four times a day (QID) | ORAL | Status: DC | PRN

## 2014-03-27 MED ORDER — PHENTERMINE HCL 37.5 MG PO TABS: 37.5000 mg | ORAL_TABLET | Freq: Every day | ORAL | Status: DC

## 2014-03-27 MED ORDER — PAROXETINE HCL 40 MG PO TABS: ORAL_TABLET | ORAL | Status: DC

## 2014-03-27 MED ORDER — ALPRAZOLAM 1 MG PO TABS: ORAL_TABLET | ORAL | Status: DC

## 2014-03-27 MED ORDER — OMEPRAZOLE 20 MG PO CPDR: 20.0000 mg | DELAYED_RELEASE_CAPSULE | Freq: Every day | ORAL | Status: DC

## 2014-03-27 NOTE — Patient Instructions (Addendum)
TENs unit; Icy hot smart relief Ice or heat application Massage therapy Chiropractor adjustment

## 2014-03-31 ENCOUNTER — Ambulatory Visit: Payer: BC Managed Care – PPO | Admitting: Nurse Practitioner

## 2014-04-01 ENCOUNTER — Encounter: Payer: Self-pay | Admitting: Nurse Practitioner

## 2014-04-01 NOTE — Progress Notes (Signed)
Subjective:  Presents for routine followup. Continues to have significant back and leg pain. 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 Also tilting her head slightly most of the time. Minimal walking. Chronic left knee pain. No edema. Takes daily vitamin D and calcium. Reflux stable. Has decreased her omeprazole to once a day. Continues to work on her weight. Would like to continue phentermine for 3 more months. Denies any adverse affects. Doing well with her diet. Has been under increased anxiety lately. Would like to hold on lab work due to finances. No chest pain/ischemic type pain or shortness of breath.  Objective:   BP 146/86  Ht 5' 5"  (1.651 m)  Wt 186 lb (84.369 kg)  BMI 30.95 kg/m2 NAD. Alert, oriented. Has lost 19 pounds since her previous visit. Lungs clear. Heart regular rate rhythm. Abdomen mildly obese soft nondistended nontender. Lower extremities no edema. Tight tender muscles noted along the neck and upper back area. Tenderness with palpation to the low back area. Gait normal limit.  Assessment: Problem List Items Addressed This Visit     Digestive   GERD (gastroesophageal reflux disease) - Primary   Relevant Medications      omeprazole (PRILOSEC) capsule     Endocrine   Impaired fasting glucose   Relevant Orders      POCT glycosylated hemoglobin (Hb A1C) (Completed)      Basic metabolic panel     Other   Hyperlipemia   Relevant Medications      pravastatin (PRAVACHOL) 80 MG tablet   Other Relevant Orders      Lipid panel   Pain management   Anxiety and depression   Morbid obesity   Relevant Medications      phentermine (ADIPEX-P) 37.5 MG tablet    Other Visit Diagnoses   High risk medication use        Relevant Orders       Hepatic function panel      Plan: Meds ordered this encounter  Medications  . omeprazole (PRILOSEC) 20 MG capsule    Sig: Take 1 capsule (20 mg total) by  mouth daily.    Dispense:  90 capsule    Refill:  1    Order Specific Question:  Supervising Daaiyah Baumert    Answer:  Mikey Kirschner [[id number]]  . phentermine (ADIPEX-P) 37.5 MG tablet    Sig: Take 1 tablet (37.5 mg total) by mouth daily before breakfast.    Dispense:  30 tablet    Refill:  2    Order Specific Question:  Supervising Pretty Weltman    Answer:  Mikey Kirschner [[id number]]  . pravastatin (PRAVACHOL) 80 MG tablet    Sig: TAKE ONE TABLET BY MOUTH ONCE DAILY FOR CHOLESTEROL    Dispense:  90 tablet    Refill:  1    Order Specific Question:  Supervising Kaelob Persky    Answer:  Mikey Kirschner [[id number]]  . PARoxetine (PAXIL) 40 MG tablet    Sig: TAKE 1 TABLET BY MOUTH EVERY MORNING.    Dispense:  90 tablet    Refill:  1    Order Specific Question:  Supervising Jackolyn Geron    Answer:  Mikey Kirschner [[id number]]  . DISCONTD: HYDROcodone-acetaminophen (NORCO) 10-325 MG per tablet    Sig: Take 1 tablet by mouth every 6 (six) hours as needed for severe pain.    Dispense:  45 tablet  Refill:  0    Order Specific Question:  Supervising Chidera Dearcos    Answer:  Mikey Kirschner [[id number]]  . ALPRAZolam (XANAX) 1 MG tablet    Sig: TAKE (1/2) TO 1 TABLET BY MOUTH TWICE DAILY AS NEEDED.    Dispense:  60 tablet    Refill:  5    May refill monthly    Order Specific Question:  Supervising Temitope Flammer    Answer:  Mikey Kirschner [[id number]]  . methocarbamol (ROBAXIN) 750 MG tablet    Sig: Take 1 tablet (750 mg total) by mouth every 8 (eight) hours as needed for muscle spasms.    Dispense:  30 tablet    Refill:  2    Order Specific Question:  Supervising Maveryck Bahri    Answer:  Mikey Kirschner [[id number]]  . DISCONTD: HYDROcodone-acetaminophen (NORCO) 10-325 MG per tablet    Sig: Take 1 tablet by mouth every 6 (six) hours as needed for severe pain.    Dispense:  45 tablet    Refill:  0    May refill 30 days from 03/27/14    Order Specific Question:  Supervising Anup Brigham    Answer:  Mikey Kirschner [[id number]]  .  HYDROcodone-acetaminophen (NORCO) 10-325 MG per tablet    Sig: Take 1 tablet by mouth every 6 (six) hours as needed for severe pain.    Dispense:  45 tablet    Refill:  0    May refill 60 days from 03/27/14    Order Specific Question:  Supervising Audelia Knape    Answer:  Mikey Kirschner [[id number]]   Continue same number of pain pills per month. Drowsiness precautions. Do not take with Xanax. Increase number of Xanax per month due to increased anxiety. Add Robaxin to regimen when necessary, drowsiness precautions. Ice/heat to the neck area. TENS unit. Discussed importance of stress reduction. Return in about 3 months (around 06/27/2014).

## 2014-04-07 ENCOUNTER — Other Ambulatory Visit: Payer: Self-pay | Admitting: Nurse Practitioner

## 2014-06-30 ENCOUNTER — Other Ambulatory Visit: Payer: Self-pay | Admitting: Family Medicine

## 2014-07-02 ENCOUNTER — Ambulatory Visit: Payer: BC Managed Care – PPO | Admitting: Nurse Practitioner

## 2014-07-11 ENCOUNTER — Ambulatory Visit (INDEPENDENT_AMBULATORY_CARE_PROVIDER_SITE_OTHER): Payer: BC Managed Care – PPO | Admitting: Nurse Practitioner

## 2014-07-11 ENCOUNTER — Encounter: Payer: Self-pay | Admitting: Nurse Practitioner

## 2014-07-11 VITALS — BP 140/84 | Ht 65.0 in | Wt 188.0 lb

## 2014-07-11 DIAGNOSIS — R52 Pain, unspecified: Secondary | ICD-10-CM

## 2014-07-11 DIAGNOSIS — M25562 Pain in left knee: Secondary | ICD-10-CM

## 2014-07-11 DIAGNOSIS — E785 Hyperlipidemia, unspecified: Secondary | ICD-10-CM

## 2014-07-11 DIAGNOSIS — Z5189 Encounter for other specified aftercare: Secondary | ICD-10-CM

## 2014-07-11 DIAGNOSIS — M25569 Pain in unspecified knee: Secondary | ICD-10-CM

## 2014-07-11 LAB — HEPATIC FUNCTION PANEL
ALBUMIN: 3.9 g/dL (ref 3.5–5.2)
ALK PHOS: 62 U/L (ref 39–117)
ALT: 29 U/L (ref 0–35)
AST: 23 U/L (ref 0–37)
Bilirubin, Direct: 0.1 mg/dL (ref 0.0–0.3)
Indirect Bilirubin: 0.3 mg/dL (ref 0.2–1.2)
TOTAL PROTEIN: 6.2 g/dL (ref 6.0–8.3)
Total Bilirubin: 0.4 mg/dL (ref 0.2–1.2)

## 2014-07-11 LAB — LIPID PANEL
Cholesterol: 253 mg/dL — ABNORMAL HIGH (ref 0–200)
HDL: 51 mg/dL (ref >39)
LDL CALC: 183 mg/dL — AB (ref 0–99)
TRIGLYCERIDES: 96 mg/dL (ref <150)
Total CHOL/HDL Ratio: 5 Ratio
VLDL: 19 mg/dL (ref 0–40)

## 2014-07-11 LAB — BASIC METABOLIC PANEL
BUN: 14 mg/dL (ref 6–23)
CO2: 32 meq/L (ref 19–32)
CREATININE: 0.81 mg/dL (ref 0.50–1.10)
Calcium: 9.2 mg/dL (ref 8.4–10.5)
Chloride: 105 mEq/L (ref 96–112)
Glucose, Bld: 125 mg/dL — ABNORMAL HIGH (ref 70–99)
Potassium: 4.3 mEq/L (ref 3.5–5.3)
Sodium: 145 mEq/L (ref 135–145)

## 2014-07-11 MED ORDER — HYDROCODONE-ACETAMINOPHEN 10-325 MG PO TABS: 1.0000 | ORAL_TABLET | Freq: Four times a day (QID) | ORAL | Status: DC | PRN

## 2014-07-11 NOTE — Patient Instructions (Signed)
Melatonin 5 mg for sleep

## 2014-07-13 ENCOUNTER — Encounter: Payer: Self-pay | Admitting: Nurse Practitioner

## 2014-07-13 NOTE — Progress Notes (Signed)
Subjective:  Presents for routine followup. Has been off her pravastatin due to cost but plans to restart this. Offered a less expensive alternative but patient wishes to restart pravastatin. No chest pain/ischemic type pain or shortness of breath. Gets flu vaccine at work. Continues to take hydrocodone about once a day, at most twice a day for chronic pain. Has had significant left knee pain for the past several weeks. 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 No other specific history of injury. Slight relief with a knee brace. Has swelling and pain in the medial anterior knee especially with prolonged standing at work.  Objective:   BP 140/84  Ht 5' 5"  (1.651 m)  Wt 188 lb (85.276 kg)  BMI 31.28 kg/m2 NAD. Alert, oriented. Lungs clear. Heart regular rate rhythm. Left knee mild erythema noted anterior knee especially towards the medial aspect. Tenderness noted along this area. No erythema or warmth. Passive ROM with tenderness with full flexion. No joint laxity. Minimal crepitus.  Assessment: Hyperlipemia  Pain management  Morbid obesity  Left knee pain  Plan:  Meds ordered this encounter  Medications  . DISCONTD: HYDROcodone-acetaminophen (NORCO) 10-325 MG per tablet    Sig: Take 1 tablet by mouth every 6 (six) hours as needed for severe pain.    Dispense:  45 tablet    Refill:  0    May refill 60 days from 07/11/14    Order Specific Question:  Supervising Provider    Answer:  Mikey Kirschner [[id number]]  . DISCONTD: HYDROcodone-acetaminophen (NORCO) 10-325 MG per tablet    Sig: Take 1 tablet by mouth every 6 (six) hours as needed for severe pain.    Dispense:  45 tablet    Refill:  0    May refill 30 days from 07/11/14    Order Specific Question:  Supervising Provider    Answer:  Mikey Kirschner [[id number]]  . HYDROcodone-acetaminophen (NORCO) 10-325 MG per tablet    Sig: Take 1 tablet by mouth every 6 (six) hours as needed for severe pain.    Dispense:  45 tablet   Refill:  0    Order Specific Question:  Supervising Provider    Answer:  LUKING, WILLIAM S [[id number]]   Ice/heat applications. Continue knee brace. Low dose anti-inflammatories as tolerated. Given information on local orthopedic specialist, recommend patient contact them for evaluation. Call back here if we can be of assistance with referral. Also strongly recommend patient get lab work done that was previously ordered.  Return in about 3 months (around 10/10/2014).

## 2014-09-09 ENCOUNTER — Other Ambulatory Visit: Payer: Self-pay | Admitting: Family Medicine

## 2014-10-06 ENCOUNTER — Encounter: Payer: Self-pay | Admitting: Nurse Practitioner

## 2014-10-06 ENCOUNTER — Ambulatory Visit (INDEPENDENT_AMBULATORY_CARE_PROVIDER_SITE_OTHER): Payer: BC Managed Care – PPO | Admitting: Nurse Practitioner

## 2014-10-06 VITALS — BP 130/76 | Ht 65.0 in | Wt 192.0 lb

## 2014-10-06 DIAGNOSIS — F418 Other specified anxiety disorders: Secondary | ICD-10-CM

## 2014-10-06 DIAGNOSIS — Z01419 Encounter for gynecological examination (general) (routine) without abnormal findings: Secondary | ICD-10-CM

## 2014-10-06 DIAGNOSIS — F32A Depression, unspecified: Secondary | ICD-10-CM

## 2014-10-06 DIAGNOSIS — Z0000 Encounter for general adult medical examination without abnormal findings: Secondary | ICD-10-CM

## 2014-10-06 DIAGNOSIS — M6588 Other synovitis and tenosynovitis, other site: Secondary | ICD-10-CM

## 2014-10-06 DIAGNOSIS — R52 Pain, unspecified: Secondary | ICD-10-CM

## 2014-10-06 DIAGNOSIS — M779 Enthesopathy, unspecified: Secondary | ICD-10-CM

## 2014-10-06 DIAGNOSIS — F329 Major depressive disorder, single episode, unspecified: Secondary | ICD-10-CM

## 2014-10-06 DIAGNOSIS — F419 Anxiety disorder, unspecified: Secondary | ICD-10-CM

## 2014-10-06 DIAGNOSIS — Z5189 Encounter for other specified aftercare: Secondary | ICD-10-CM

## 2014-10-06 DIAGNOSIS — M40203 Unspecified kyphosis, cervicothoracic region: Secondary | ICD-10-CM

## 2014-10-06 DIAGNOSIS — M778 Other enthesopathies, not elsewhere classified: Secondary | ICD-10-CM

## 2014-10-06 MED ORDER — HYDROCODONE-ACETAMINOPHEN 10-325 MG PO TABS: 1.0000 | ORAL_TABLET | Freq: Four times a day (QID) | ORAL | Status: DC | PRN

## 2014-10-06 MED ORDER — ALPRAZOLAM 1 MG PO TABS: ORAL_TABLET | ORAL | Status: DC

## 2014-10-06 NOTE — Progress Notes (Signed)
Subjective:    Patient ID: Catherine Moore, female    DOB: 11-Sep-1956, 58 y.o.   MRN: [id number]  HPI presents for her wellness exam. Same sexual partner. No vaginal bleeding or pelvic pain. Not eating very healthy. Limited exercise due to schedule. Regular vision exams. Needs dental exam. Defers pelvic and rectal exams today.    Review of Systems  Constitutional: Negative for fever, activity change, appetite change and fatigue.  HENT: Negative for dental problem, ear pain, sinus pressure and sore throat.   Respiratory: Negative for cough, chest tightness, shortness of breath and wheezing.   Cardiovascular: Negative for chest pain.  Gastrointestinal: Negative for nausea, vomiting, abdominal pain, diarrhea, constipation and abdominal distention.  Genitourinary: Negative for dysuria, urgency, frequency, vaginal bleeding, vaginal discharge, enuresis, difficulty urinating, genital sores and pelvic pain.  Musculoskeletal:       Pain along thumb to the base in to left wrist area. Does repetitive motions at work.        Objective:   Physical Exam  Constitutional: She is oriented to person, place, and time. She appears well-developed. No distress.  HENT:  Right Ear: External ear normal.  Left Ear: External ear normal.  Mouth/Throat: Oropharynx is clear and moist.  Neck: Normal range of motion. Neck supple. No tracheal deviation present. No thyromegaly present.  Cardiovascular: Normal rate, regular rhythm and normal heart sounds.  Exam reveals no gallop.   No murmur heard. Pulmonary/Chest: Effort normal and breath sounds normal.  Abdominal: Soft. She exhibits no distension. There is no tenderness.  Musculoskeletal: She exhibits no edema.  Tenderness left thumb and wrist area with palpation and ROM. Mild edema. No erythema. Mild kyphosis noted.  Lymphadenopathy:    She has no cervical adenopathy.  Neurological: She is alert and oriented to person, place, and time.  Skin: Skin is warm and  dry. No rash noted.  Psychiatric: She has a normal mood and affect. Her behavior is normal.  Vitals reviewed. Breast exam: no masses; axillae no adenopathy.        Assessment & Plan:   Problem List Items Addressed This Visit      Other   Pain management   Anxiety and depression   Morbid obesity    Other Visit Diagnoses    Well woman exam    -  Primary    Relevant Orders       DG Bone Density       POC Hemoccult Bld/Stl (3-Cd Home Screen)    Kyphosis of cervicothoracic region, unspecified kyphosis type        Relevant Orders       DG Bone Density    Thumb tendonitis        Left wrist tendonitis          Meds ordered this encounter  Medications  . ALPRAZolam (XANAX) 1 MG tablet    Sig: TAKE (1/2) TO 1 TABLET BY MOUTH TWICE DAILY AS NEEDED.    Dispense:  60 tablet    Refill:  5    May refill monthly    Order Specific Question:  Supervising Provider    Answer:  Mikey Kirschner [[id number]]  . DISCONTD: HYDROcodone-acetaminophen (NORCO) 10-325 MG per tablet    Sig: Take 1 tablet by mouth every 6 (six) hours as needed for severe pain.    Dispense:  45 tablet    Refill:  0    Order Specific Question:  Supervising Provider    Answer:  Baltazar Apo  S [[id number]]  . DISCONTD: HYDROcodone-acetaminophen (NORCO) 10-325 MG per tablet    Sig: Take 1 tablet by mouth every 6 (six) hours as needed for severe pain.    Dispense:  45 tablet    Refill:  0    May fill 30 days from 10/06/14    Order Specific Question:  Supervising Provider    Answer:  Mikey Kirschner [[id number]]  . HYDROcodone-acetaminophen (NORCO) 10-325 MG per tablet    Sig: Take 1 tablet by mouth every 6 (six) hours as needed for severe pain.    Dispense:  45 tablet    Refill:  0    May fill 60 days from 10/06/14    Order Specific Question:  Supervising Provider    Answer:  Mikey Kirschner [[id number]]   Given Rx for wrist/thumb spica splint. Wishes to try this first. Call back if no better, recommend referral to hand  specialist.  Recommend healthy diet, regular activity and weight loss. Daily vitamin D and calcium supplement. To check to see if she can get DT at work. Recheck in 3 months if she continues pain medication. Next PE in one year.

## 2014-10-06 NOTE — Patient Instructions (Addendum)
Qsymia or Belviq Savenda (new med for weight loss)

## 2014-10-29 ENCOUNTER — Other Ambulatory Visit: Payer: Self-pay | Admitting: Family Medicine

## 2014-12-29 ENCOUNTER — Other Ambulatory Visit: Payer: Self-pay | Admitting: Nurse Practitioner

## 2014-12-29 ENCOUNTER — Telehealth: Payer: Self-pay | Admitting: Nurse Practitioner

## 2014-12-29 MED ORDER — HYDROCODONE-ACETAMINOPHEN 10-325 MG PO TABS: 1.0000 | ORAL_TABLET | Freq: Four times a day (QID) | ORAL | Status: DC | PRN

## 2014-12-29 NOTE — Telephone Encounter (Signed)
Discussed with pt. Script ready for pickup.

## 2014-12-29 NOTE — Telephone Encounter (Signed)
Looks like a miscommunication. We will give her a new RX until she can get in to see [place].

## 2014-12-29 NOTE — Telephone Encounter (Signed)
The last time patient was seen in the office in December, she was scheduled for a 6 month follow up in error.  Patient realized she was going to run out of her hydrocodone on 01/03/15 and scheduled an appointment for Carolyn's next 8 appointment on 01/14/15.  Can we give her a refill on her hydrocodone to get her through to the 30th?

## 2015-01-03 ENCOUNTER — Other Ambulatory Visit: Payer: Self-pay | Admitting: Family Medicine

## 2015-01-14 ENCOUNTER — Encounter: Payer: Self-pay | Admitting: Nurse Practitioner

## 2015-01-14 ENCOUNTER — Ambulatory Visit (INDEPENDENT_AMBULATORY_CARE_PROVIDER_SITE_OTHER): Payer: 59 | Admitting: Nurse Practitioner

## 2015-01-14 VITALS — BP 110/72 | Ht 65.0 in | Wt 201.0 lb

## 2015-01-14 DIAGNOSIS — E785 Hyperlipidemia, unspecified: Secondary | ICD-10-CM

## 2015-01-14 DIAGNOSIS — M25562 Pain in left knee: Secondary | ICD-10-CM

## 2015-01-14 DIAGNOSIS — Z79899 Other long term (current) drug therapy: Secondary | ICD-10-CM

## 2015-01-14 DIAGNOSIS — M779 Enthesopathy, unspecified: Secondary | ICD-10-CM

## 2015-01-14 DIAGNOSIS — M778 Other enthesopathies, not elsewhere classified: Secondary | ICD-10-CM

## 2015-01-14 DIAGNOSIS — M6588 Other synovitis and tenosynovitis, other site: Secondary | ICD-10-CM | POA: Diagnosis not present

## 2015-01-14 MED ORDER — PRAVASTATIN SODIUM 80 MG PO TABS: ORAL_TABLET | ORAL | Status: DC

## 2015-01-14 MED ORDER — HYDROCODONE-ACETAMINOPHEN 10-325 MG PO TABS: 1.0000 | ORAL_TABLET | Freq: Four times a day (QID) | ORAL | Status: DC | PRN

## 2015-01-14 NOTE — Patient Instructions (Signed)
Phentermine 15 mg tablet

## 2015-01-15 ENCOUNTER — Encounter: Payer: Self-pay | Admitting: Nurse Practitioner

## 2015-01-15 LAB — HEPATIC FUNCTION PANEL
ALT: 25 IU/L (ref 0–32)
AST: 25 IU/L (ref 0–40)
Albumin: 4.5 g/dL (ref 3.5–5.5)
Alkaline Phosphatase: 78 IU/L (ref 39–117)
BILIRUBIN TOTAL: 0.3 mg/dL (ref 0.0–1.2)
Bilirubin, Direct: 0.07 mg/dL (ref 0.00–0.40)
Total Protein: 6.7 g/dL (ref 6.0–8.5)

## 2015-01-15 LAB — LIPID PANEL
Chol/HDL Ratio: 6 ratio units — ABNORMAL HIGH (ref 0.0–4.4)
Cholesterol, Total: 290 mg/dL — ABNORMAL HIGH (ref 100–199)
HDL: 48 mg/dL (ref >39)
LDL Calculated: 197 mg/dL — ABNORMAL HIGH (ref 0–99)
TRIGLYCERIDES: 223 mg/dL — AB (ref 0–149)
VLDL Cholesterol Cal: 45 mg/dL — ABNORMAL HIGH (ref 5–40)

## 2015-01-15 NOTE — Progress Notes (Signed)
Subjective:  Presents for routine follow-up. Compliant with her pravastatin. Sees orthopedic doctor in [place] for chronic left knee pain. Has had injections which work for short period of time but pain has come back. 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 Having significant pain in both hands especially the left side near the thumb. 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 Because of knee pain and long hours working, limited activity outside of work. No chest pain/ischemic type pain or shortness of breath. Takes hydrocodone only for severe pain which helps her function.  Objective:   BP 110/72 mmHg  Ht 5' 5"  (1.651 m)  Wt 201 lb (91.173 kg)  BMI 33.45 kg/m2 NAD. Alert, oriented. Lungs clear. Heart regular rate rhythm. Lower extremities trace edema. Edema and tenderness noted at the base of the left thumb with a possible small nodule consistent with ganglion cyst.  Assessment: Hyperlipemia - Plan: Lipid panel  Morbid obesity  Left knee pain  Tendinitis of left hand  High risk medication use - Plan: Hepatic function panel  Plan:  Meds ordered this encounter  Medications  . pravastatin (PRAVACHOL) 80 MG tablet    Sig: TAKE ONE TABLET BY MOUTH ONCE DAILY FOR CHOLESTEROL    Dispense:  90 tablet    Refill:  1    Order Specific Question:  Supervising Provider    Answer:  Mikey Kirschner [[id number]]  . DISCONTD: HYDROcodone-acetaminophen (NORCO) 10-325 MG per tablet    Sig: Take 1 tablet by mouth every 6 (six) hours as needed for severe pain.    Dispense:  60 tablet    Refill:  0    Order Specific Question:  Supervising Provider    Answer:  Mikey Kirschner [[id number]]  . DISCONTD: HYDROcodone-acetaminophen (NORCO) 10-325 MG per tablet    Sig: Take 1 tablet by mouth every 6 (six) hours as needed for severe pain.    Dispense:  60 tablet    Refill:  0    May fill 30 days from 01/14/15    Order Specific Question:  Supervising Provider    Answer:   Mikey Kirschner [[id number]]  . HYDROcodone-acetaminophen (NORCO) 10-325 MG per tablet    Sig: Take 1 tablet by mouth every 6 (six) hours as needed for severe pain.    Dispense:  60 tablet    Refill:  0    May fill 60 days from 01/14/15    Order Specific Question:  Supervising Provider    Answer:  Mikey Kirschner [[id number]]   Continue neoprene wrist support. Ice applications. Anti-inflammatories if tolerated. Given a note for her job to see if she can switch to a different position requiring less time on her feet and hand movement. Patient understands these problems will probably continue and possibly worsen if she continues to work at this type of occupation. Repeat lipid and liver profile.Continue follow-up with orthopedic specialist. Also have them check her left hand. Return in about 3 months (around 04/16/2015).

## 2015-01-20 ENCOUNTER — Encounter: Payer: Self-pay | Admitting: Family Medicine

## 2015-01-20 ENCOUNTER — Ambulatory Visit (INDEPENDENT_AMBULATORY_CARE_PROVIDER_SITE_OTHER): Payer: 59 | Admitting: Family Medicine

## 2015-01-20 VITALS — BP 140/82 | Ht 65.0 in | Wt 202.0 lb

## 2015-01-20 DIAGNOSIS — M1712 Unilateral primary osteoarthritis, left knee: Secondary | ICD-10-CM | POA: Insufficient documentation

## 2015-01-20 DIAGNOSIS — M129 Arthropathy, unspecified: Secondary | ICD-10-CM | POA: Diagnosis not present

## 2015-01-20 DIAGNOSIS — M778 Other enthesopathies, not elsewhere classified: Secondary | ICD-10-CM

## 2015-01-20 NOTE — Progress Notes (Signed)
   Subjective:    Patient ID: Catherine Moore, female    DOB: [birth year], 59 y.o.   MRN: [id number]  HPI Patient is here today to discuss the physical limitations due to her knee problem and ability to perform the requirements of her current job. Her employer is needing a letter from the doctor listing her restrictions in detail.   Patient states that she has fluid in her left ear and she would like the doctor to take a look at it. History of allergies so allergies stable. Slight fullness in ears and question diminished hearing on that side  Left hand primarily giving pt pain, progressive over the past six months.  Deep aching in the left hand with pain. Progressive over the past half year.  Chronic history of known arthritis in the knees. Saw orthopedist in the past. Had an injection in the past. Has not seen him for a while.  Positive family history of rheumatoid arthritis  Review of Systems No headache no chest pain no back pain no abdominal pain no change in bowel    Objective:   Physical Exam  Alert no acute distress HEENT left external canal and tympanic membrane middle ear all appear normal. Lungs clear heart rare rhythm. Left wrist positive tender at thenar eminence. Positive tender ventral tendons. No obvious deformity knees substantial crepitations left greater than right no effusion      Assessment & Plan:  Impression 1 progressive knee arthritis discussed at length #2 left wrist tendinitis discussed length #3 nonspecific left ear symptoms patient wishes to hold off on referral for now discussed plan work limitations discussed no greater than 36 hours total. No greater than 4 hours standing. No greater than 4 hours with repetitive activities involving left wrist. Orthopedic referral to address both of these issues W[facility]

## 2015-01-29 ENCOUNTER — Other Ambulatory Visit: Payer: Self-pay | Admitting: Nurse Practitioner

## 2015-01-29 MED ORDER — EZETIMIBE 10 MG PO TABS: 10.0000 mg | ORAL_TABLET | Freq: Every day | ORAL | Status: DC

## 2015-02-12 ENCOUNTER — Other Ambulatory Visit: Payer: Self-pay | Admitting: Family Medicine

## 2015-02-13 ENCOUNTER — Other Ambulatory Visit: Payer: Self-pay | Admitting: *Deleted

## 2015-02-13 DIAGNOSIS — Z79899 Other long term (current) drug therapy: Secondary | ICD-10-CM

## 2015-02-13 DIAGNOSIS — E785 Hyperlipidemia, unspecified: Secondary | ICD-10-CM

## 2015-02-13 MED ORDER — FENOFIBRATE 160 MG PO TABS
160.0000 mg | ORAL_TABLET | Freq: Every day | ORAL | Status: DC
Start: 2015-02-13 — End: 2015-07-06

## 2015-03-07 ENCOUNTER — Other Ambulatory Visit: Payer: Self-pay | Admitting: Family Medicine

## 2015-04-01 ENCOUNTER — Other Ambulatory Visit: Payer: Self-pay | Admitting: *Deleted

## 2015-04-01 MED ORDER — ALPRAZOLAM 1 MG PO TABS: ORAL_TABLET | ORAL | Status: DC

## 2015-04-06 ENCOUNTER — Ambulatory Visit (INDEPENDENT_AMBULATORY_CARE_PROVIDER_SITE_OTHER): Payer: 59 | Admitting: Nurse Practitioner

## 2015-04-06 ENCOUNTER — Encounter: Payer: Self-pay | Admitting: Nurse Practitioner

## 2015-04-06 VITALS — BP 124/82 | Ht 65.0 in | Wt 201.2 lb

## 2015-04-06 DIAGNOSIS — R52 Pain, unspecified: Secondary | ICD-10-CM

## 2015-04-06 DIAGNOSIS — Z5189 Encounter for other specified aftercare: Secondary | ICD-10-CM

## 2015-04-06 DIAGNOSIS — M129 Arthropathy, unspecified: Secondary | ICD-10-CM | POA: Diagnosis not present

## 2015-04-06 DIAGNOSIS — M1712 Unilateral primary osteoarthritis, left knee: Secondary | ICD-10-CM

## 2015-04-06 MED ORDER — HYDROCODONE-ACETAMINOPHEN 10-325 MG PO TABS: 1.0000 | ORAL_TABLET | Freq: Four times a day (QID) | ORAL | Status: DC | PRN

## 2015-04-06 MED ORDER — PHENTERMINE HCL 37.5 MG PO TABS: 37.5000 mg | ORAL_TABLET | Freq: Every day | ORAL | Status: DC

## 2015-04-06 NOTE — Progress Notes (Signed)
Subjective:  Presents for routine follow-up for her pain management. Has not had to take her medicine in 3-4 days. Some days she does not any medicine at all, sometimes up to 3 per day. Has significant arthritis in her knees back in her hands. Now wearing a hand brace all the time. 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 Pain medication allows her to work and function on her worst days. Also because of change in her job status has been putting some of her weight back on. Has been on phentermine for a while, would like to try this again. Has taken without difficulty in the past. Eats healthy at work during the day but has not been doing well with her diet at nighttime.  Objective:   BP 124/82 mmHg  Ht 5' 5"  (1.651 m)  Wt 201 lb 4 oz (91.286 kg)  BMI 33.49 kg/m2 NAD. Alert, oriented. Cheerful affect. Lungs clear. Heart regular rate rhythm.  Assessment:  Problem List Items Addressed This Visit      Musculoskeletal and Integument   Arthritis of knee, left     Other   Morbid obesity   Relevant Medications   phentermine (ADIPEX-P) 37.5 MG tablet   Pain management - Primary     Plan:  Meds ordered this encounter  Medications  . phentermine (ADIPEX-P) 37.5 MG tablet    Sig: Take 1 tablet (37.5 mg total) by mouth daily before breakfast.    Dispense:  30 tablet    Refill:  2    Order Specific Question:  Supervising Provider    Answer:  Mikey Kirschner [[id number]]  . DISCONTD: HYDROcodone-acetaminophen (NORCO) 10-325 MG per tablet    Sig: Take 1 tablet by mouth every 6 (six) hours as needed for severe pain.    Dispense:  60 tablet    Refill:  0    May fill 60 days from 04/06/15    Order Specific Question:  Supervising Provider    Answer:  Mikey Kirschner [[id number]]  . DISCONTD: HYDROcodone-acetaminophen (NORCO) 10-325 MG per tablet    Sig: Take 1 tablet by mouth every 6 (six) hours as needed for severe pain.    Dispense:  60 tablet   Refill:  0    May fill 30 days from 04/06/15    Order Specific Question:  Supervising Provider    Answer:  Mikey Kirschner [[id number]]  . HYDROcodone-acetaminophen (NORCO) 10-325 MG per tablet    Sig: Take 1 tablet by mouth every 6 (six) hours as needed for severe pain.    Dispense:  60 tablet    Refill:  0    Order Specific Question:  Supervising Provider    Answer:  Mikey Kirschner [[id number]]   Given 3 separate monthly prescriptions for her pain medication. Limit to no more than 60 per month. Restart phentermine as directed. Encourage patient to stay as active as possible and restart healthy diet. Controlled substances agreement and opioid risk assessment tool completed. Return in about 3 months (around 07/07/2015) for recheck.

## 2015-04-07 ENCOUNTER — Ambulatory Visit: Payer: BC Managed Care – PPO | Admitting: Nurse Practitioner

## 2015-04-15 ENCOUNTER — Other Ambulatory Visit: Payer: Self-pay | Admitting: Family Medicine

## 2015-04-16 ENCOUNTER — Other Ambulatory Visit: Payer: Self-pay | Admitting: Family Medicine

## 2015-04-29 ENCOUNTER — Other Ambulatory Visit: Payer: Self-pay | Admitting: *Deleted

## 2015-04-30 MED ORDER — ALPRAZOLAM 1 MG PO TABS: ORAL_TABLET | ORAL | Status: DC

## 2015-05-06 ENCOUNTER — Other Ambulatory Visit: Payer: Self-pay | Admitting: Nurse Practitioner

## 2015-06-26 ENCOUNTER — Other Ambulatory Visit: Payer: Self-pay | Admitting: *Deleted

## 2015-06-26 NOTE — Progress Notes (Signed)
Ok plus two monthly ref

## 2015-06-29 ENCOUNTER — Telehealth: Payer: Self-pay | Admitting: Family Medicine

## 2015-06-29 MED ORDER — ALPRAZOLAM 1 MG PO TABS: ORAL_TABLET | ORAL | Status: DC

## 2015-06-29 NOTE — Telephone Encounter (Signed)
Script will be faxed to pharmacy today. Patient was notified.

## 2015-06-29 NOTE — Telephone Encounter (Signed)
Pt is needing a refill on her zanax   [facility]

## 2015-06-29 NOTE — Telephone Encounter (Signed)
Ok plus three monthly ref

## 2015-06-29 NOTE — Telephone Encounter (Signed)
Last seen 04/06/15

## 2015-07-06 ENCOUNTER — Ambulatory Visit (INDEPENDENT_AMBULATORY_CARE_PROVIDER_SITE_OTHER): Payer: 59 | Admitting: Nurse Practitioner

## 2015-07-06 ENCOUNTER — Encounter: Payer: Self-pay | Admitting: Nurse Practitioner

## 2015-07-06 VITALS — BP 118/76 | Ht 64.5 in | Wt 200.0 lb

## 2015-07-06 DIAGNOSIS — E785 Hyperlipidemia, unspecified: Secondary | ICD-10-CM | POA: Diagnosis not present

## 2015-07-06 DIAGNOSIS — R748 Abnormal levels of other serum enzymes: Secondary | ICD-10-CM | POA: Diagnosis not present

## 2015-07-06 DIAGNOSIS — Z79891 Long term (current) use of opiate analgesic: Secondary | ICD-10-CM | POA: Diagnosis not present

## 2015-07-06 DIAGNOSIS — M791 Myalgia, unspecified site: Secondary | ICD-10-CM

## 2015-07-06 DIAGNOSIS — Z79899 Other long term (current) drug therapy: Secondary | ICD-10-CM | POA: Diagnosis not present

## 2015-07-06 MED ORDER — HYDROCODONE-ACETAMINOPHEN 10-325 MG PO TABS: 1.0000 | ORAL_TABLET | Freq: Four times a day (QID) | ORAL | Status: DC | PRN

## 2015-07-06 MED ORDER — ROSUVASTATIN CALCIUM 10 MG PO TABS: 10.0000 mg | ORAL_TABLET | Freq: Every day | ORAL | Status: DC

## 2015-07-06 NOTE — Progress Notes (Signed)
Subjective:  This patient was seen today for chronic pain  The medication list was reviewed and updated.   -Compliance with pain medication: 3 x per day  The patient was advised the importance of maintaining medication and not using illegal substances with these.  Refills needed: yes  The patient was educated that we can provide 3 monthly scripts for their medication, it is their responsibility to follow the instructions.  Side effects or complications from medications: no  Patient is aware that pain medications are meant to minimize the severity of the pain to allow their pain levels to improve to allow for better function. They are aware of that pain medications cannot totally remove their pain.  Due for UDT ( at least once per year) : yes  Was unable to afford generic fenofibrate. Compliant with pravastatin. Doing very well with her diet. Has been seeing orthopedic Dr. for shots in her knees for arthritis, this helps initially but begins to wear off. Her job has been modified because of her chronic arthritis in her hands and wrist.     Objective:   BP 118/76 mmHg  Ht 5' 4.5" (1.638 m)  Wt 200 lb (90.719 kg)  BMI 33.81 kg/m2 NAD. Alert, oriented. Lungs clear. Heart regular rate rhythm.  Assessment:  Problem List Items Addressed This Visit      Other   Encounter for long-term opiate analgesic use - Primary   Relevant Orders   ToxASSURE Select 13 (MW), Urine   Hyperlipemia   Relevant Medications   rosuvastatin (CRESTOR) 10 MG tablet   Other Relevant Orders   Lipid panel    Other Visit Diagnoses    Myalgia        Relevant Orders    Sedimentation rate    CK (Creatine Kinase)    High risk medication use        Relevant Orders    Hepatic function panel      Plan:  Meds ordered this encounter  Medications  . MILK THISTLE PO    Sig: Take by mouth.  . rosuvastatin (CRESTOR) 10 MG tablet    Sig: Take 1 tablet (10 mg total) by mouth daily.    Dispense:  30 tablet   Refill:  2    Order Specific Question:  Supervising Provider    Answer:  Mikey Kirschner [[id number]]  . DISCONTD: HYDROcodone-acetaminophen (NORCO) 10-325 MG per tablet    Sig: Take 1 tablet by mouth every 6 (six) hours as needed for severe pain.    Dispense:  60 tablet    Refill:  0    Order Specific Question:  Supervising Provider    Answer:  Mikey Kirschner [[id number]]  . DISCONTD: HYDROcodone-acetaminophen (NORCO) 10-325 MG per tablet    Sig: Take 1 tablet by mouth every 6 (six) hours as needed for severe pain.    Dispense:  60 tablet    Refill:  0    May fill 30 days from 07/06/15    Order Specific Question:  Supervising Provider    Answer:  Mikey Kirschner [[id number]]  . HYDROcodone-acetaminophen (NORCO) 10-325 MG per tablet    Sig: Take 1 tablet by mouth every 6 (six) hours as needed for severe pain.    Dispense:  60 tablet    Refill:  0    May fill 60 days from 07/06/15    Order Specific Question:  Supervising Provider    Answer:  Mikey Kirschner [[id number]]   If not too  expensive, stop pravastatin and switch to Crestor 10 mg. Hold on lab work for 8-10 weeks. Discussed risk associated with opiate use with benzodiazepines, patient has been taking this long-term without difficulty. Return in about 3 months (around 10/05/2015) for recheck. Reminded about preventive health physical. Encouraged flu vaccine.

## 2015-07-07 ENCOUNTER — Ambulatory Visit: Payer: 59 | Admitting: Nurse Practitioner

## 2015-07-09 ENCOUNTER — Telehealth: Payer: Self-pay | Admitting: Nurse Practitioner

## 2015-07-09 ENCOUNTER — Other Ambulatory Visit: Payer: Self-pay | Admitting: Nurse Practitioner

## 2015-07-09 MED ORDER — AZITHROMYCIN 250 MG PO TABS: ORAL_TABLET | ORAL | Status: DC

## 2015-07-09 NOTE — Telephone Encounter (Signed)
Pt seen 9/20 by [name], says she has a sinus infection and she Needs a zpak called in if possible to [facility] they spoke about it at the appt   [facility]

## 2015-07-09 NOTE — Telephone Encounter (Signed)
done

## 2015-07-09 NOTE — Telephone Encounter (Signed)
Please call pt at work when done, [phone] will be at work until 5:00

## 2015-07-10 LAB — TOXASSURE SELECT 13 (MW), URINE: PDF: 0

## 2015-07-10 NOTE — Telephone Encounter (Signed)
[facility] 07/10/15

## 2015-07-10 NOTE — Telephone Encounter (Signed)
Patient notified and stated she picked up med last night.

## 2015-08-28 ENCOUNTER — Other Ambulatory Visit: Payer: Self-pay | Admitting: Family Medicine

## 2015-08-30 NOTE — Telephone Encounter (Signed)
Dr. Sandrea Hughs

## 2015-08-31 NOTE — Telephone Encounter (Signed)
Ok for six mo worth

## 2015-09-04 ENCOUNTER — Other Ambulatory Visit: Payer: Self-pay | Admitting: Family Medicine

## 2015-10-05 ENCOUNTER — Other Ambulatory Visit: Payer: Self-pay | Admitting: Nurse Practitioner

## 2015-10-05 ENCOUNTER — Encounter: Payer: Self-pay | Admitting: Nurse Practitioner

## 2015-10-05 ENCOUNTER — Ambulatory Visit (INDEPENDENT_AMBULATORY_CARE_PROVIDER_SITE_OTHER): Payer: 59 | Admitting: Nurse Practitioner

## 2015-10-05 VITALS — BP 122/80 | Ht 64.5 in | Wt 203.4 lb

## 2015-10-05 DIAGNOSIS — Z5189 Encounter for other specified aftercare: Secondary | ICD-10-CM | POA: Diagnosis not present

## 2015-10-05 DIAGNOSIS — R52 Pain, unspecified: Secondary | ICD-10-CM

## 2015-10-05 DIAGNOSIS — M778 Other enthesopathies, not elsewhere classified: Secondary | ICD-10-CM | POA: Diagnosis not present

## 2015-10-05 DIAGNOSIS — Z79891 Long term (current) use of opiate analgesic: Secondary | ICD-10-CM

## 2015-10-05 DIAGNOSIS — Z139 Encounter for screening, unspecified: Secondary | ICD-10-CM

## 2015-10-05 DIAGNOSIS — M779 Enthesopathy, unspecified: Secondary | ICD-10-CM

## 2015-10-05 DIAGNOSIS — R7301 Impaired fasting glucose: Secondary | ICD-10-CM | POA: Diagnosis not present

## 2015-10-05 DIAGNOSIS — M40203 Unspecified kyphosis, cervicothoracic region: Secondary | ICD-10-CM | POA: Diagnosis not present

## 2015-10-05 DIAGNOSIS — Z13820 Encounter for screening for osteoporosis: Secondary | ICD-10-CM

## 2015-10-05 DIAGNOSIS — M6588 Other synovitis and tenosynovitis, other site: Secondary | ICD-10-CM | POA: Diagnosis not present

## 2015-10-05 MED ORDER — HYDROCODONE-ACETAMINOPHEN 10-325 MG PO TABS: 1.0000 | ORAL_TABLET | Freq: Four times a day (QID) | ORAL | Status: DC | PRN

## 2015-10-05 NOTE — Progress Notes (Signed)
Subjective:  This patient was seen today for chronic pain  The medication list was reviewed and updated.   -Compliance with medication: yes  - Number patient states they take daily: twice  -when was the last dose patient took? Last night  The patient was advised the importance of maintaining medication and not using illegal substances with these.  Refills needed: yes  The patient was educated that we can provide 3 monthly scripts for their medication, it is their responsibility to follow the instructions.  Side effects or complications from medications: none  Patient is aware that pain medications are meant to minimize the severity of the pain to allow their pain levels to improve to allow for better function. They are aware of that pain medications cannot totally remove their pain.  Due for UDT ( at least once per year) : UTD  Still having significant right wrist and thumb pain. Wearing a wrist brace. Has seen a specialist, according to patient she was diagnosed with osteoarthritis. Has been diagnosed with tendonitis in the past.      Objective:   BP 122/80 mmHg  Ht 5' 4.5" (1.638 m)  Wt 203 lb 6 oz (92.25 kg)  BMI 34.38 kg/m2 NAD. Alert, oriented. Lungs clear. Heart RRR. Mild kyphosis noted.   Assessment:  Problem List Items Addressed This Visit      Endocrine   Impaired fasting glucose   Relevant Orders   Basic metabolic panel (Completed)   Hemoglobin A1c (Completed)   Hepatitis C Antibody (Completed)   HIV antibody (Completed)     Musculoskeletal and Integument   Humpback   Left wrist tendonitis   Relevant Orders   Basic metabolic panel (Completed)   Hemoglobin A1c (Completed)   Hepatitis C Antibody (Completed)   HIV antibody (Completed)   Thumb tendonitis   Relevant Orders   Basic metabolic panel (Completed)   Hemoglobin A1c (Completed)   Hepatitis C Antibody (Completed)   HIV antibody (Completed)     Other   Encounter for long-term opiate analgesic  use - Primary   Relevant Orders   Basic metabolic panel (Completed)   Hemoglobin A1c (Completed)   Hepatitis C Antibody (Completed)   HIV antibody (Completed)   Pain management   Relevant Orders   Basic metabolic panel (Completed)   Hemoglobin A1c (Completed)   Hepatitis C Antibody (Completed)   HIV antibody (Completed)    Other Visit Diagnoses    Screening for osteoporosis          Plan:  Meds ordered this encounter  Medications  . DISCONTD: HYDROcodone-acetaminophen (NORCO) 10-325 MG tablet    Sig: Take 1 tablet by mouth every 6 (six) hours as needed for severe pain.    Dispense:  60 tablet    Refill:  0    May fill 60 days from 10/05/15    Order Specific Question:  Supervising Provider    Answer:  Mikey Kirschner [[id number]]  . DISCONTD: HYDROcodone-acetaminophen (NORCO) 10-325 MG tablet    Sig: Take 1 tablet by mouth every 6 (six) hours as needed for severe pain.    Dispense:  60 tablet    Refill:  0    May fill 30 days from 10/05/15    Order Specific Question:  Supervising Provider    Answer:  Mikey Kirschner [[id number]]  . HYDROcodone-acetaminophen (NORCO) 10-325 MG tablet    Sig: Take 1 tablet by mouth every 6 (six) hours as needed for severe pain.    Dispense:  60 tablet    Refill:  0    Order Specific Question:  Supervising Provider    Answer:  Maggie Font   Will refer to hand specialist for further evaluation.  Return in about 3 months (around 01/03/2016) for recheck. Reminded patient about physical.

## 2015-10-06 ENCOUNTER — Encounter: Payer: Self-pay | Admitting: Nurse Practitioner

## 2015-10-06 DIAGNOSIS — M779 Enthesopathy, unspecified: Secondary | ICD-10-CM

## 2015-10-06 DIAGNOSIS — M778 Other enthesopathies, not elsewhere classified: Secondary | ICD-10-CM | POA: Insufficient documentation

## 2015-10-06 DIAGNOSIS — M40209 Unspecified kyphosis, site unspecified: Secondary | ICD-10-CM | POA: Insufficient documentation

## 2015-10-06 LAB — BASIC METABOLIC PANEL
BUN / CREAT RATIO: 13 (ref 9–23)
BUN: 9 mg/dL (ref 6–24)
CO2: 25 mmol/L (ref 18–29)
CREATININE: 0.67 mg/dL (ref 0.57–1.00)
Calcium: 9.2 mg/dL (ref 8.7–10.2)
Chloride: 100 mmol/L (ref 96–106)
GFR calc Af Amer: 111 mL/min/{1.73_m2} (ref >59)
GFR, EST NON AFRICAN AMERICAN: 97 mL/min/{1.73_m2} (ref >59)
GLUCOSE: 200 mg/dL — AB (ref 65–99)
Potassium: 4 mmol/L (ref 3.5–5.2)
SODIUM: 141 mmol/L (ref 134–144)

## 2015-10-06 LAB — HEPATITIS C ANTIBODY

## 2015-10-06 LAB — SEDIMENTATION RATE: Sed Rate: 14 mm/hr (ref 0–40)

## 2015-10-06 LAB — LIPID PANEL
Chol/HDL Ratio: 3.7 ratio units (ref 0.0–4.4)
Cholesterol, Total: 167 mg/dL (ref 100–199)
HDL: 45 mg/dL (ref >39)
LDL CALC: 98 mg/dL (ref 0–99)
TRIGLYCERIDES: 121 mg/dL (ref 0–149)
VLDL Cholesterol Cal: 24 mg/dL (ref 5–40)

## 2015-10-06 LAB — HEPATIC FUNCTION PANEL
ALT: 79 IU/L — AB (ref 0–32)
AST: 99 IU/L — ABNORMAL HIGH (ref 0–40)
Albumin: 4.3 g/dL (ref 3.5–5.5)
Alkaline Phosphatase: 81 IU/L (ref 39–117)
BILIRUBIN, DIRECT: 0.14 mg/dL (ref 0.00–0.40)
Bilirubin Total: 0.5 mg/dL (ref 0.0–1.2)
Total Protein: 6.5 g/dL (ref 6.0–8.5)

## 2015-10-06 LAB — CK: Total CK: 121 U/L (ref 24–173)

## 2015-10-06 LAB — HIV ANTIBODY (ROUTINE TESTING W REFLEX): HIV SCREEN 4TH GENERATION: NONREACTIVE

## 2015-10-06 LAB — HEMOGLOBIN A1C
Est. average glucose Bld gHb Est-mCnc: 214 mg/dL
HEMOGLOBIN A1C: 9.1 % — AB (ref 4.8–5.6)

## 2015-10-13 ENCOUNTER — Telehealth: Payer: Self-pay | Admitting: Nurse Practitioner

## 2015-10-13 NOTE — Telephone Encounter (Signed)
Calling to check on results to labwork that she had completed last week.

## 2015-10-14 ENCOUNTER — Telehealth: Payer: Self-pay | Admitting: Family Medicine

## 2015-10-14 ENCOUNTER — Inpatient Hospital Stay (HOSPITAL_COMMUNITY): Admission: RE | Admit: 2015-10-14 | Payer: Self-pay | Source: Ambulatory Visit

## 2015-10-14 ENCOUNTER — Encounter: Payer: Self-pay | Admitting: Family Medicine

## 2015-10-14 MED ORDER — ONDANSETRON 4 MG PO TBDP: ORAL_TABLET | ORAL | Status: DC

## 2015-10-14 NOTE — Telephone Encounter (Signed)
Patient wants some phenegan called [facility] has been throwing up since yesterday.

## 2015-10-14 NOTE — Telephone Encounter (Signed)
Usually zofran works better odt four mg sl q 6 hrs prn #15, but if pt would rather have phenergan either phen tabs 25 mg 24 one q six or phen supp 25 mg  Numb twelve one q four to six prn

## 2015-10-14 NOTE — Telephone Encounter (Signed)
Called and spoke with patient. Patient stated that she is afebrile. C/o of diarrhea with onset of yesterday and vomiting with onset of early this morning.Unable to keep food down. Patient would like to know if she can be prescribed Phenergan to help with vomiting Please advise

## 2015-10-14 NOTE — Telephone Encounter (Signed)
Called patient and informed her per Dr.Steve Luking- zofran works better but if she prefers phenergan we can send that in. Patient verbalized understanding and stated that Zofran is fine. Medication sent into requested pharmacy.

## 2015-10-16 ENCOUNTER — Other Ambulatory Visit: Payer: Self-pay | Admitting: Nurse Practitioner

## 2015-10-16 ENCOUNTER — Encounter: Payer: Self-pay | Admitting: Nurse Practitioner

## 2015-10-16 ENCOUNTER — Other Ambulatory Visit: Payer: Self-pay | Admitting: Family Medicine

## 2015-10-16 DIAGNOSIS — E119 Type 2 diabetes mellitus without complications: Secondary | ICD-10-CM | POA: Insufficient documentation

## 2015-10-16 MED ORDER — METFORMIN HCL 500 MG PO TABS: 500.0000 mg | ORAL_TABLET | Freq: Two times a day (BID) | ORAL | Status: DC

## 2015-10-16 NOTE — Telephone Encounter (Signed)
done

## 2015-10-22 DIAGNOSIS — M1812 Unilateral primary osteoarthritis of first carpometacarpal joint, left hand: Secondary | ICD-10-CM | POA: Insufficient documentation

## 2015-10-22 NOTE — Addendum Note (Signed)
Addended by: Ofilia Neas R on: 10/22/2015 03:57 PM   Modules accepted: Orders

## 2015-11-16 ENCOUNTER — Other Ambulatory Visit: Payer: Self-pay | Admitting: Family Medicine

## 2016-01-04 ENCOUNTER — Ambulatory Visit: Payer: 59 | Admitting: Nurse Practitioner

## 2016-01-04 ENCOUNTER — Encounter: Payer: Self-pay | Admitting: Nurse Practitioner

## 2016-01-04 ENCOUNTER — Ambulatory Visit (INDEPENDENT_AMBULATORY_CARE_PROVIDER_SITE_OTHER): Payer: BLUE CROSS/BLUE SHIELD | Admitting: Nurse Practitioner

## 2016-01-04 VITALS — BP 150/82 | Ht 64.5 in | Wt 206.4 lb

## 2016-01-04 DIAGNOSIS — J300 Vasomotor rhinitis: Secondary | ICD-10-CM | POA: Diagnosis not present

## 2016-01-04 DIAGNOSIS — M2550 Pain in unspecified joint: Secondary | ICD-10-CM

## 2016-01-04 DIAGNOSIS — Z79891 Long term (current) use of opiate analgesic: Secondary | ICD-10-CM | POA: Diagnosis not present

## 2016-01-04 MED ORDER — HYDROCODONE-ACETAMINOPHEN 10-325 MG PO TABS: 1.0000 | ORAL_TABLET | Freq: Four times a day (QID) | ORAL | Status: DC | PRN

## 2016-01-04 MED ORDER — METHYLPREDNISOLONE ACETATE 40 MG/ML IJ SUSP
40.0000 mg | Freq: Once | INTRAMUSCULAR | Status: AC
  Administered 2016-01-04: 40 mg via INTRAMUSCULAR

## 2016-01-04 NOTE — Progress Notes (Signed)
Subjective:  This patient was seen today for chronic pain  The medication list was reviewed and updated.   -Compliance with medication: yes  - Number patient states they take daily: twice  -when was the last dose patient took? Last night  The patient was advised the importance of maintaining medication and not using illegal substances with these.  Refills needed: yes  The patient was educated that we can provide 3 monthly scripts for their medication, it is their responsibility to follow the instructions.  Side effects or complications from medications: yes  Patient is aware that pain medications are meant to minimize the severity of the pain to allow their pain levels to improve to allow for better function. They are aware of that pain medications cannot totally remove their pain.  Due for UDT ( at least once per year) : UTD  Also c/o ear pressure and runny nose. No fever, sinus headache, cough or sore throat. Having diffuse arthralgias. Dr. Lynann Bologna, her specialist requests adding on RA testing to labs that are due. Currently on Zyrtec and Flonase.      Objective:   BP 150/82 mmHg  Ht 5' 4.5" (1.638 m)  Wt 206 lb 6 oz (93.611 kg)  BMI 34.89 kg/m2 NAD. Alert, oriented. TMs very retracted bilateral, normal color. Pharynx nonerythematous with cloudy PND noted. Neck supple with mild soft anterior adenopathy. Lungs clear. Heart regular rate rhythm.  Assessment:  Problem List Items Addressed This Visit      Other   Encounter for long-term opiate analgesic use - Primary    Other Visit Diagnoses    Arthralgia        Relevant Orders    Rheumatoid factor    Antinuclear Antib (ANA)    Vasomotor rhinitis        Relevant Medications    methylPREDNISolone acetate (DEPO-MEDROL) injection 40 mg (Completed)      Plan:  RA and ANA added to lab work the patient plans to have done this morning. Meds ordered this encounter  Medications  . DISCONTD: HYDROcodone-acetaminophen (NORCO)  10-325 MG tablet    Sig: Take 1 tablet by mouth every 6 (six) hours as needed for severe pain.    Dispense:  60 tablet    Refill:  0    Order Specific Question:  Supervising Provider    Answer:  Mikey Kirschner [[id number]]  . DISCONTD: HYDROcodone-acetaminophen (NORCO) 10-325 MG tablet    Sig: Take 1 tablet by mouth every 6 (six) hours as needed for severe pain.    Dispense:  60 tablet    Refill:  0    May fill 30 days from 01/04/16    Order Specific Question:  Supervising Provider    Answer:  Mikey Kirschner [[id number]]  . HYDROcodone-acetaminophen (NORCO) 10-325 MG tablet    Sig: Take 1 tablet by mouth every 6 (six) hours as needed for severe pain.    Dispense:  60 tablet    Refill:  0    May fill 60 days from 01/04/16    Order Specific Question:  Supervising Provider    Answer:  Mikey Kirschner [[id number]]  . methylPREDNISolone acetate (DEPO-MEDROL) injection 40 mg    Sig:    Call back if no improvement in ear pressure. Encourage patient to get activity as tolerated and continued weight loss efforts. Return in about 3 months (around 04/05/2016) for pain management.

## 2016-01-05 ENCOUNTER — Other Ambulatory Visit: Payer: Self-pay | Admitting: Nurse Practitioner

## 2016-01-05 DIAGNOSIS — E119 Type 2 diabetes mellitus without complications: Secondary | ICD-10-CM

## 2016-01-05 DIAGNOSIS — R748 Abnormal levels of other serum enzymes: Secondary | ICD-10-CM | POA: Insufficient documentation

## 2016-01-05 DIAGNOSIS — M2550 Pain in unspecified joint: Secondary | ICD-10-CM

## 2016-01-05 LAB — HEPATIC FUNCTION PANEL
ALK PHOS: 67 IU/L (ref 39–117)
ALT: 108 IU/L — AB (ref 0–32)
AST: 88 IU/L — AB (ref 0–40)
Albumin: 4.2 g/dL (ref 3.5–5.5)
BILIRUBIN, DIRECT: 0.13 mg/dL (ref 0.00–0.40)
Bilirubin Total: 0.5 mg/dL (ref 0.0–1.2)
Total Protein: 6.7 g/dL (ref 6.0–8.5)

## 2016-01-05 LAB — RHEUMATOID FACTOR: RHEUMATOID FACTOR: 32.5 [IU]/mL — AB (ref 0.0–13.9)

## 2016-01-05 LAB — FERRITIN: FERRITIN: 702 ng/mL — AB (ref 15–150)

## 2016-01-05 LAB — ANA: ANA: NEGATIVE

## 2016-01-06 ENCOUNTER — Encounter: Payer: Self-pay | Admitting: Internal Medicine

## 2016-01-06 ENCOUNTER — Encounter: Payer: Self-pay | Admitting: Family Medicine

## 2016-01-22 ENCOUNTER — Encounter: Payer: Self-pay | Admitting: Gastroenterology

## 2016-01-22 ENCOUNTER — Ambulatory Visit (INDEPENDENT_AMBULATORY_CARE_PROVIDER_SITE_OTHER): Payer: BLUE CROSS/BLUE SHIELD | Admitting: Gastroenterology

## 2016-01-22 VITALS — BP 131/80 | HR 80 | Temp 97.3°F | Ht 66.0 in | Wt 203.0 lb

## 2016-01-22 DIAGNOSIS — R7989 Other specified abnormal findings of blood chemistry: Secondary | ICD-10-CM | POA: Diagnosis not present

## 2016-01-22 DIAGNOSIS — R799 Abnormal finding of blood chemistry, unspecified: Secondary | ICD-10-CM | POA: Diagnosis not present

## 2016-01-22 DIAGNOSIS — R748 Abnormal levels of other serum enzymes: Secondary | ICD-10-CM

## 2016-01-22 DIAGNOSIS — R945 Abnormal results of liver function studies: Principal | ICD-10-CM

## 2016-01-22 LAB — CBC
HCT: 43.1 % (ref 35.0–45.0)
Hemoglobin: 14.8 g/dL (ref 11.7–15.5)
MCH: 29.5 pg (ref 27.0–33.0)
MCHC: 34.3 g/dL (ref 32.0–36.0)
MCV: 86 fL (ref 80.0–100.0)
MPV: 9.9 fL (ref 7.5–12.5)
PLATELETS: 270 10*3/uL (ref 140–400)
RBC: 5.01 MIL/uL (ref 3.80–5.10)
RDW: 13.6 % (ref 11.0–15.0)
WBC: 6.6 10*3/uL (ref 3.8–10.8)

## 2016-01-22 LAB — PROTIME-INR
INR: 0.92 (ref <1.50)
PROTHROMBIN TIME: 12.5 s (ref 11.6–15.2)

## 2016-01-22 LAB — HEPATITIS B SURFACE ANTIGEN: HEP B S AG: NEGATIVE

## 2016-01-22 LAB — TSH: TSH: 2.29 mIU/L

## 2016-01-22 LAB — IRON AND TIBC
%SAT: 44 % (ref 11–50)
IRON: 131 ug/dL (ref 45–160)
TIBC: 298 ug/dL (ref 250–450)
UIBC: 167 ug/dL (ref 125–400)

## 2016-01-22 NOTE — Patient Instructions (Signed)
I have ordered extensive labs. I have also ordered an ultrasound of your liver.  We will be in touch shortly with further recommendations!

## 2016-01-22 NOTE — Progress Notes (Signed)
Primary Care Physician:  Mickie Hillier, MD Primary Gastroenterologist:  Dr. Gala Romney   Chief Complaint  Patient presents with  . Elevated LFT'S    HPI:   Catherine Moore is a 60 y.o. female presenting today at the request of Pearson Forster, NP, secondary to elevated LFTs. Appears she has had normal LFTs up until 3 months ago, with moderate elevation in transaminases. She was taken off of her statin. Upon review of records, a remote US revealed a fatty liver. Hep C antibody negative, ferritin elevated at 702. Her A1c was 9.1 recently. ANA negative.   States her body aches. Shoulders ache, can't stand for anyone to touch her. Legs ache. No numbness or tingling. Stays gassy. No constipation or diarrhea. No rectal bleeding. Prilosec once daily. One time was in a restaurant trying to swallow food and it all just "came up". Happened several times but was in remote past. Sometimes forgetful. Not all the time. Has a place in the back of her head that stays sore. She complains of itching in lower extremities.    Transaminases elevated starting 3 months ago.  Has been historically about 175 lbs but now 200. Steadily gained weight in the past year. No family history of liver disease. She denies any OTC herbal supplements.   Past Medical History  Diagnosis Date  . Depression   . Anxiety   . Hyperlipidemia   . Diabetes [facility])     Past Surgical History  Procedure Laterality Date  . Colonoscopy  08/09/2010    NOB:[id number] rectum colon  . Endometrial ablation      Current Outpatient Prescriptions  Medication Sig Dispense Refill  . ALPRAZolam (XANAX) 1 MG tablet TAKE 1/2-1 TABLET TWICE DAILY AS NEEDED. 60 tablet 5  . cetirizine (ZYRTEC) 10 MG tablet TAKE (1) TABLET BY MOUTH ONCE DAILY. 30 tablet 5  . fluticasone (FLONASE) 50 MCG/ACT nasal spray Place 2 sprays into both nostrils daily. 16 g 11  . HYDROcodone-acetaminophen (NORCO) 10-325 MG tablet Take 1 tablet by mouth every 6 (six) hours as  needed for severe pain. 60 tablet 0  . metFORMIN (GLUCOPHAGE) 500 MG tablet Take 1 tablet (500 mg total) by mouth 2 (two) times daily with a meal. 60 tablet 2  . methocarbamol (ROBAXIN) 750 MG tablet Take 1 tablet (750 mg total) by mouth every 8 (eight) hours as needed for muscle spasms. 30 tablet 2  . MILK THISTLE PO Take by mouth.    Marland Kitchen omeprazole (PRILOSEC) 20 MG capsule TAKE 1 CAPSULE BY MOUTH ONCE A DAY. 30 capsule 5  . PARoxetine (PAXIL) 40 MG tablet TAKE (1) TABLET BY MOUTH ONCE DAILY. 30 tablet 2   No current facility-administered medications for this visit.    Allergies as of 01/22/2016 - Review Complete 01/22/2016  Allergen Reaction Noted  . Codeine  07/19/2010  . Meperidine hcl  07/19/2010  . Penicillins  07/19/2010  . Zocor [simvastatin]  02/01/2013    Family History  Problem Relation Age of Onset  . Diabetes Father   . Hypertension Father   . Heart attack Father 64    MI  . Stroke Father   . Osteoporosis Mother   . Arthritis Sister   . Arthritis Brother   . Colon cancer Neg Hx   . Liver disease Neg Hx     Social History   Social History  . Marital Status: Divorced    Spouse Name: N/A  . Number of Children: N/A  .  Years of Education: N/A   Occupational History  . Not on file.   Social History Main Topics  . Smoking status: Former Smoker -- 1.50 packs/day for 20 years    Types: Cigarettes    Start date: 05/23/1971    Quit date: 06/16/1991  . Smokeless tobacco: Never Used  . Alcohol Use: No  . Drug Use: No  . Sexual Activity: Yes    Birth Control/ Protection: Post-menopausal, Surgical   Other Topics Concern  . Not on file   Social History Narrative    Review of Systems: Negative unless mentioned in HPI   Physical Exam: BP 131/80 mmHg  Pulse 80  Temp(Src) 97.3 F (36.3 C) (Oral)  Ht 5' 6"  (1.676 m)  Wt 203 lb (92.08 kg)  BMI 32.78 kg/m2 General:   Alert and oriented. Pleasant and cooperative. Well-nourished and well-developed.  Head:   Normocephalic and atraumatic. No obvious lesion at crown of head.  Eyes:  Without icterus, sclera clear and conjunctiva pink.  Ears:  Normal auditory acuity. Nose:  No deformity, discharge,  or lesions. Mouth:  No deformity or lesions, oral mucosa pink.  Lungs:  Clear to auscultation bilaterally. No wheezes, rales, or rhonchi. No distress.  Heart:  S1, S2 present without murmurs appreciated.  Abdomen:  +BS, soft, non-tender and non-distended. No HSM noted. No guarding or rebound. No masses appreciated.  Rectal:  Deferred  Msk:  Symmetrical without gross deformities. Normal posture. Extremities:  Without edema. Neurologic:  Alert and  oriented x4;  grossly normal neurologically. Skin:  Intact without significant lesions or rashes. Psych:  Alert and cooperative. Normal mood and affect.  Lab Results  Component Value Date   ALT 108* 01/04/2016   AST 88* 01/04/2016   ALKPHOS 67 01/04/2016   BILITOT 0.5 01/04/2016   Hepatitis C antibody negative  Lab Results  Component Value Date   FERRITIN 702* 01/04/2016

## 2016-01-22 NOTE — Progress Notes (Signed)
cc'ed to pcp °

## 2016-01-22 NOTE — Assessment & Plan Note (Signed)
60 year old female with moderate elevation of transaminases over the past few months. Thus far, Hep C antibody and ANA negative. Her ferritin was significantly elevated at 702, but I do not have any other iron studies. As this is an acute phase reactant, unclear if this is related to iron overload or not. Will need further evaluation. I note that she has documentation of a fatty liver in 2008 on ultrasound, and she could very well have NAFLD or NASH. Following ACG guidelines, I have ordered extensive labs including autoimmune/PBC serologies to be thorough. I have also ordered an elastography. I discussed with patient how this test often "overcalls" fibrosis, but if there is evidence of F4, we would pursue an EGD to evaluate for any stigmata of chronic liver disease. Further recommendations to follow after labs and elastography. Discussed weight loss and avoidance of alcohol, as she is already doing. Next colonoscopy in 2021 or sooner if needed.

## 2016-01-25 ENCOUNTER — Ambulatory Visit (HOSPITAL_COMMUNITY)
Admission: RE | Admit: 2016-01-25 | Discharge: 2016-01-25 | Disposition: A | Discharge status: Home / Self Care | Payer: BLUE CROSS/BLUE SHIELD | Source: Ambulatory Visit | Attending: Gastroenterology | Admitting: Gastroenterology

## 2016-01-25 DIAGNOSIS — K760 Fatty (change of) liver, not elsewhere classified: Secondary | ICD-10-CM | POA: Insufficient documentation

## 2016-01-25 DIAGNOSIS — K8020 Calculus of gallbladder without cholecystitis without obstruction: Secondary | ICD-10-CM | POA: Insufficient documentation

## 2016-01-25 DIAGNOSIS — R7989 Other specified abnormal findings of blood chemistry: Secondary | ICD-10-CM

## 2016-01-25 DIAGNOSIS — R945 Abnormal results of liver function studies: Secondary | ICD-10-CM

## 2016-01-25 LAB — GLIA (IGA/G) + TTG IGA
GLIADIN IGG: 2 U (ref <20)
Gliadin IgA: 7 Units (ref <20)
TISSUE TRANSGLUTAMINASE AB, IGA: 1 U/mL (ref <4)

## 2016-01-25 LAB — CERULOPLASMIN: Ceruloplasmin: 37 mg/dL (ref 18–53)

## 2016-01-26 ENCOUNTER — Telehealth: Payer: Self-pay | Admitting: Internal Medicine

## 2016-01-26 ENCOUNTER — Other Ambulatory Visit: Payer: Self-pay | Admitting: Nurse Practitioner

## 2016-01-26 LAB — ANTI-SMOOTH MUSCLE ANTIBODY, IGG: Smooth Muscle Ab: <20 U (ref <20)

## 2016-01-26 NOTE — Telephone Encounter (Signed)
Pt had labs done on Friday last week and U/S done yesterday and was told that we would have the results today. I told her it normally takes 7-10 business days for the results to be available, but I would put a note in for the nurse. Please call [phone]

## 2016-01-26 NOTE — Telephone Encounter (Signed)
Routing to AS

## 2016-01-26 NOTE — Progress Notes (Signed)
Quick Note:  Elastography notes a fibrosis score of some F3/F4. She has fibrosis and could have some early cirrhosis but does not appear to have other laboratory abnormalities that would point towards chronic liver disease. At this point, we could consider an EGD for screening and assess for changes consistent with portal hypertension. This would need to be done with Propofol due to polypharmacy. Would recommend waiting on labs first before making final decision. They are still pending. ______

## 2016-01-26 NOTE — Telephone Encounter (Signed)
See result note. Labs are still pending, but I have addressed the ultrasound.

## 2016-01-27 LAB — MITOCHONDRIAL ANTIBODIES

## 2016-02-01 ENCOUNTER — Other Ambulatory Visit: Payer: Self-pay | Admitting: Gastroenterology

## 2016-02-01 ENCOUNTER — Telehealth: Payer: Self-pay

## 2016-02-01 ENCOUNTER — Other Ambulatory Visit: Payer: Self-pay

## 2016-02-01 ENCOUNTER — Telehealth: Payer: Self-pay | Admitting: Internal Medicine

## 2016-02-01 DIAGNOSIS — R7989 Other specified abnormal findings of blood chemistry: Secondary | ICD-10-CM

## 2016-02-01 NOTE — Telephone Encounter (Signed)
Spoke with the pt.  

## 2016-02-01 NOTE — Telephone Encounter (Signed)
Routing to AS. Lab results are now in epic

## 2016-02-01 NOTE — Telephone Encounter (Signed)
Please see result note 

## 2016-02-01 NOTE — Telephone Encounter (Signed)
Pt called and is wanting lab results. Pt is aware of [place] results

## 2016-02-01 NOTE — Telephone Encounter (Signed)
Patient called inquiring about labs and ultrasound results

## 2016-02-01 NOTE — Progress Notes (Signed)
Quick Note:  Autoimmune, PBC, celiac serologies, Hepatitis B negative. TSH normal. No anemia on CBC. Iron saturation right at 44, with a value greater than 45 concerning cor iron overload. Let's check hemochromatosis, although I doubt this is the case. ______

## 2016-02-03 DIAGNOSIS — R7989 Other specified abnormal findings of blood chemistry: Secondary | ICD-10-CM | POA: Diagnosis not present

## 2016-02-08 ENCOUNTER — Telehealth: Payer: Self-pay | Admitting: Internal Medicine

## 2016-02-08 LAB — HEMOCHROMATOSIS DNA-PCR(C282Y,H63D)

## 2016-02-08 NOTE — Telephone Encounter (Signed)
Routing to LSL 

## 2016-02-08 NOTE — Telephone Encounter (Signed)
Pt had labs done last Wednesday to check her iron and was asking if her results were available. Please call [phone]

## 2016-02-09 NOTE — Telephone Encounter (Signed)
This is actually for Vicente Males but we need to find the results as they are not in EPIC.

## 2016-02-09 NOTE — Telephone Encounter (Signed)
I spoke with the pt.

## 2016-02-09 NOTE — Telephone Encounter (Signed)
Just came back. She was referring to hemochromatosis labs. See result note.

## 2016-02-09 NOTE — Progress Notes (Signed)
Quick Note:  Hemochromatosis labs completed and she is likely an "unaffected carrier". Does not appear to have this, and I am not surprised. Her elevated ferritin may likely be secondary to fatty liver. We could recheck this in about 3 months. The elastography showed F3/F4, but she has no other laboratory indicators of chronic liver disease. I would be hesitant to call this cirrhosis at this point. We could do a fibrosure test through [facility] to compare the scores. Needs to follow low fat diet, exercise, weight loss, and recheck HFP in 3 months. At this point, I would not jump to an EGD for variceal screening, as I doubt she has cirrhosis at this time. ______

## 2016-02-18 DIAGNOSIS — M2550 Pain in unspecified joint: Secondary | ICD-10-CM | POA: Diagnosis not present

## 2016-02-19 LAB — ANA: ANA: NEGATIVE

## 2016-02-19 LAB — RHEUMATOID FACTOR: RHEUMATOID FACTOR: 29.1 [IU]/mL — AB (ref 0.0–13.9)

## 2016-02-19 NOTE — Addendum Note (Signed)
Addended by: Ofilia Neas R on: 02/19/2016 02:01 PM   Modules accepted: Orders

## 2016-02-23 ENCOUNTER — Encounter: Payer: Self-pay | Admitting: Family Medicine

## 2016-02-28 DIAGNOSIS — S82831A Other fracture of upper and lower end of right fibula, initial encounter for closed fracture: Secondary | ICD-10-CM | POA: Diagnosis not present

## 2016-02-28 DIAGNOSIS — Z7984 Long term (current) use of oral hypoglycemic drugs: Secondary | ICD-10-CM | POA: Diagnosis not present

## 2016-02-28 DIAGNOSIS — M79671 Pain in right foot: Secondary | ICD-10-CM | POA: Diagnosis not present

## 2016-02-28 DIAGNOSIS — Z79899 Other long term (current) drug therapy: Secondary | ICD-10-CM | POA: Diagnosis not present

## 2016-02-28 DIAGNOSIS — S82421A Displaced transverse fracture of shaft of right fibula, initial encounter for closed fracture: Secondary | ICD-10-CM | POA: Diagnosis not present

## 2016-02-28 DIAGNOSIS — W19XXXA Unspecified fall, initial encounter: Secondary | ICD-10-CM | POA: Diagnosis not present

## 2016-03-07 DIAGNOSIS — M25571 Pain in right ankle and joints of right foot: Secondary | ICD-10-CM | POA: Diagnosis not present

## 2016-03-07 DIAGNOSIS — S82401A Unspecified fracture of shaft of right fibula, initial encounter for closed fracture: Secondary | ICD-10-CM | POA: Diagnosis not present

## 2016-03-22 DIAGNOSIS — M7989 Other specified soft tissue disorders: Secondary | ICD-10-CM | POA: Diagnosis not present

## 2016-03-22 DIAGNOSIS — R5383 Other fatigue: Secondary | ICD-10-CM | POA: Diagnosis not present

## 2016-03-22 DIAGNOSIS — R7989 Other specified abnormal findings of blood chemistry: Secondary | ICD-10-CM | POA: Diagnosis not present

## 2016-03-22 DIAGNOSIS — M2550 Pain in unspecified joint: Secondary | ICD-10-CM | POA: Diagnosis not present

## 2016-03-22 DIAGNOSIS — R768 Other specified abnormal immunological findings in serum: Secondary | ICD-10-CM | POA: Diagnosis not present

## 2016-03-28 ENCOUNTER — Other Ambulatory Visit: Payer: Self-pay | Admitting: Family Medicine

## 2016-03-28 NOTE — Telephone Encounter (Signed)
Ok three mo worth apraz and six mo worth of other

## 2016-04-05 ENCOUNTER — Ambulatory Visit: Payer: BLUE CROSS/BLUE SHIELD | Admitting: Nurse Practitioner

## 2016-04-05 DIAGNOSIS — R768 Other specified abnormal immunological findings in serum: Secondary | ICD-10-CM | POA: Diagnosis not present

## 2016-04-05 DIAGNOSIS — M2550 Pain in unspecified joint: Secondary | ICD-10-CM | POA: Diagnosis not present

## 2016-04-05 DIAGNOSIS — R7989 Other specified abnormal findings of blood chemistry: Secondary | ICD-10-CM | POA: Diagnosis not present

## 2016-04-06 ENCOUNTER — Ambulatory Visit (INDEPENDENT_AMBULATORY_CARE_PROVIDER_SITE_OTHER): Payer: BLUE CROSS/BLUE SHIELD | Admitting: Nurse Practitioner

## 2016-04-06 ENCOUNTER — Encounter: Payer: Self-pay | Admitting: Nurse Practitioner

## 2016-04-06 ENCOUNTER — Ambulatory Visit: Payer: BLUE CROSS/BLUE SHIELD | Admitting: Nurse Practitioner

## 2016-04-06 DIAGNOSIS — Z79891 Long term (current) use of opiate analgesic: Secondary | ICD-10-CM

## 2016-04-06 DIAGNOSIS — R748 Abnormal levels of other serum enzymes: Secondary | ICD-10-CM | POA: Diagnosis not present

## 2016-04-06 MED ORDER — HYDROCODONE-ACETAMINOPHEN 10-325 MG PO TABS: 1.0000 | ORAL_TABLET | Freq: Four times a day (QID) | ORAL | Status: DC | PRN

## 2016-04-06 NOTE — Progress Notes (Signed)
Subjective:  This patient was seen today for chronic pain  The medication list was reviewed and updated.   -Compliance with medication: yes  - Number patient states they take daily: twice every day  -when was the last dose patient took? This am  The patient was advised the importance of maintaining medication and not using illegal substances with these.  Refills needed: yes   The patient was educated that we can provide 3 monthly scripts for their medication, it is their responsibility to follow the instructions.  Side effects or complications from medications: none  Patient is aware that pain medications are meant to minimize the severity of the pain to allow their pain levels to improve to allow for better function. They are aware of that pain medications cannot totally remove their pain. Patient has rheumatoid arthritis and osteoarthritis. Medication brings pain down to a tolerable level so she can perform ADLs. 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 Due for UDT ( at least once per year) : 07/06/15  [place] CSR reviewed Patient recently underwent workup for elevated liver enzymes and ferritin level. Is still unclear about status at this point. Frustrated about lack of communication from GI specialist.     Objective:   BP 122/78 mmHg  Ht 5' 4.5" (1.638 m)  Wt 209 lb 12.8 oz (95.165 kg)  BMI 35.47 kg/m2 NAD. Alert, oriented. Lungs clear. Heart regular rate rhythm.  Assessment:  Problem List Items Addressed This Visit      Other   Elevated liver enzymes   Relevant Orders   Hepatic function panel   Encounter for long-term opiate analgesic use   Hemochromatosis - Primary   Relevant Orders   Ferritin     Plan:  Meds ordered this encounter  Medications  . DISCONTD: HYDROcodone-acetaminophen (NORCO) 10-325 MG tablet    Sig: Take 1 tablet by mouth every 6 (six) hours as needed for severe pain.    Dispense:  60 tablet    Refill:  0    May fill 60 days  from 04/06/16    Order Specific Question:  Supervising Provider    Answer:  Mikey Kirschner [[id number]]  . DISCONTD: HYDROcodone-acetaminophen (NORCO) 10-325 MG tablet    Sig: Take 1 tablet by mouth every 6 (six) hours as needed for severe pain.    Dispense:  60 tablet    Refill:  0    May fill 30 days from 04/06/16    Order Specific Question:  Supervising Provider    Answer:  Mikey Kirschner [[id number]]  . HYDROcodone-acetaminophen (NORCO) 10-325 MG tablet    Sig: Take 1 tablet by mouth every 6 (six) hours as needed for severe pain.    Dispense:  60 tablet    Refill:  0    Order Specific Question:  Supervising Provider    Answer:  Mikey Kirschner [[id number]]   After visit, hemoglobin A1c added to lab work. Further follow-up based on these results. Otherwise routine follow-up for pain management in 3 months.

## 2016-04-07 ENCOUNTER — Encounter: Payer: Self-pay | Admitting: Nurse Practitioner

## 2016-04-07 ENCOUNTER — Other Ambulatory Visit: Payer: Self-pay | Admitting: Nurse Practitioner

## 2016-04-07 DIAGNOSIS — E119 Type 2 diabetes mellitus without complications: Secondary | ICD-10-CM

## 2016-04-08 DIAGNOSIS — M25571 Pain in right ankle and joints of right foot: Secondary | ICD-10-CM | POA: Diagnosis not present

## 2016-04-12 DIAGNOSIS — R748 Abnormal levels of other serum enzymes: Secondary | ICD-10-CM | POA: Diagnosis not present

## 2016-04-13 LAB — HEPATIC FUNCTION PANEL
ALBUMIN: 4.1 g/dL (ref 3.5–5.5)
ALK PHOS: 72 IU/L (ref 39–117)
ALT: 81 IU/L — AB (ref 0–32)
AST: 90 IU/L — AB (ref 0–40)
BILIRUBIN TOTAL: 0.4 mg/dL (ref 0.0–1.2)
BILIRUBIN, DIRECT: 0.11 mg/dL (ref 0.00–0.40)
Total Protein: 6.5 g/dL (ref 6.0–8.5)

## 2016-04-13 LAB — FERRITIN: Ferritin: 639 ng/mL — ABNORMAL HIGH (ref 15–150)

## 2016-04-21 ENCOUNTER — Encounter: Payer: Self-pay | Admitting: Family Medicine

## 2016-04-21 NOTE — Addendum Note (Signed)
Addended by: Dairl Ponder on: 04/21/2016 09:16 AM   Modules accepted: Orders

## 2016-04-22 ENCOUNTER — Encounter: Payer: Self-pay | Admitting: Internal Medicine

## 2016-04-25 ENCOUNTER — Other Ambulatory Visit: Payer: Self-pay | Admitting: Family Medicine

## 2016-05-03 ENCOUNTER — Telehealth: Payer: Self-pay | Admitting: Nurse Practitioner

## 2016-05-03 NOTE — Telephone Encounter (Signed)
I contacted the GI office and there is no record of lab being drawn for specialized test she spoke of. I recommend that she follow up at least once more with them to discuss this.

## 2016-05-03 NOTE — Telephone Encounter (Signed)
Notified patient contacted the GI office and there is no record of lab being drawn for specialized test she spoke of. Recommend that she follow up at least once more with them to discuss this. Patient verbalized understanding.

## 2016-05-27 ENCOUNTER — Ambulatory Visit: Payer: BLUE CROSS/BLUE SHIELD | Admitting: Gastroenterology

## 2016-06-08 ENCOUNTER — Telehealth: Payer: Self-pay

## 2016-06-08 ENCOUNTER — Other Ambulatory Visit: Payer: Self-pay

## 2016-06-08 ENCOUNTER — Ambulatory Visit (INDEPENDENT_AMBULATORY_CARE_PROVIDER_SITE_OTHER): Payer: BLUE CROSS/BLUE SHIELD | Admitting: Gastroenterology

## 2016-06-08 ENCOUNTER — Encounter: Payer: Self-pay | Admitting: Gastroenterology

## 2016-06-08 VITALS — BP 142/73 | HR 93 | Temp 98.2°F | Ht 63.0 in | Wt 202.8 lb

## 2016-06-08 DIAGNOSIS — R945 Abnormal results of liver function studies: Principal | ICD-10-CM

## 2016-06-08 DIAGNOSIS — R748 Abnormal levels of other serum enzymes: Secondary | ICD-10-CM

## 2016-06-08 DIAGNOSIS — R7989 Other specified abnormal findings of blood chemistry: Secondary | ICD-10-CM

## 2016-06-08 NOTE — Telephone Encounter (Signed)
Noted. Patient seen today in clinic. She believes she had this done, even though there is no record of this from the lab. She states she received a bill. I have asked her to bring this by our office for [place] to verify due to the confusion. I am wondering if maybe she had labs done that she believes were the labs for [facility] but may have been another lab. Regardless, if they were the [facility] labs and she was billed, [facility] will need to reimburse her as they are nowhere to be found.

## 2016-06-08 NOTE — Telephone Encounter (Signed)
Scheduled transjugular liver bx for 06/13/16 at [facility] and informed pt. Instructions given to pt.

## 2016-06-08 NOTE — Patient Instructions (Signed)
I have ordered a liver biopsy with specialized measurements of your "pressures" in your liver to see exactly what is going on. This will see if you do have iron overload and what is going on with your liver. I will also discuss your case with Hematology.   We will see you back in about 6-8 weeks. If your liver biopsy shows that you have advanced fibrosis or cirrhosis, you will need an upper endoscopy.

## 2016-06-08 NOTE — Progress Notes (Signed)
cc'ed to pcp °

## 2016-06-08 NOTE — Telephone Encounter (Signed)
-----   Message from Nilda Simmer, NP sent at 04/26/2016  3:13 PM EDT ----- Almyra Free, thanks so much! [name] ----- Message -----    From: Claudina Lick, LPN    Sent: [id number]/95/6213   2:15 PM      To: Nilda Simmer, NP, Orvil Feil, NP  Randall Hiss! I have looked everywhere in this office and couldn't find any results. So I called the [facility] and they do not have any record of receiving any blood work for Ms.Nyra Capes. I also called [facility] and they do not have any record of her coming over there to have it drawn.  Almyra Free ----- Message -----    From: Nilda Simmer, NP    Sent: 04/25/2016  11:09 AM      To: Claudina Lick, LPN  No problem. Thanks for your help.  ----- Message -----    From: Claudina Lick, LPN    Sent: [id number]/86/5784   8:38 AM      To: Nilda Simmer, NP  Randall Hiss, sorry I was on vacation last week and just got your message. I will check on that blood work and get back with you.  Almyra Free ----- Message -----    From: Nilda Simmer, NP    Sent: 04/20/2016   7:27 AM      To: Claudina Lick, LPN  Irven Shelling, I recently saw this patient and she was asking about her fibrosure testing she recently. I was not sure if this was done or the results since results are not in Epic. Thanks so much for your help. [name]

## 2016-06-08 NOTE — Progress Notes (Signed)
Referring Provider: Nilda Simmer, NP Primary Care Physician:  Mickie Hillier, MD  Primary GI: Dr. Gala Romney   Chief Complaint  Patient presents with  . Elevated Hepatic Enzymes    HPI:   Catherine Moore is a 60 y.o. female presenting today with a history of elevated LFTs, normal historically but with moderate elevation in transaminases starting earlier this year. She was then taken off her statin. Remote ultrasound revealed fatty liver. Hep C antibody negative. Ferritin elevated at 702, A1c 9.1 recently. ANA negative. Has steadily gained weight over the past year. Autoimmune, PBC, celiac serologies, Hep B negative. TSH normal. No anemia on CBC. Iron saturation right at 44. 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 Next colonoscopy due in 2021. Elastography with F3/F4. No other laboratory abnormalities that would point towards chronic liver disease. Here to discuss EGD for screening purposes. Would need with Propofol. We discussed possibly doing a fibrosure test to compare the elastography scores, but this wasn't completed.   Feels tired, aches all over. Patient states that she went and had the labs done for the fibrosure and actually received a bill for it. I have asked her to return this to our office so we may review this.   Past Medical History:  Diagnosis Date  . Anxiety   . Depression   . Diabetes ([place])   . Hyperlipidemia     Past Surgical History:  Procedure Laterality Date  . COLONOSCOPY  08/09/2010   NID:[id number] rectum colon  . ENDOMETRIAL ABLATION      Current Outpatient Prescriptions  Medication Sig Dispense Refill  . ALPRAZolam (XANAX) 1 MG tablet TAKE 1/2-1 TABLET BY MOUTH TWICE DAILY AS NEEDED 60 tablet 3  . cetirizine (ZYRTEC) 10 MG tablet TAKE (1) TABLET BY MOUTH ONCE DAILY. 30 tablet 5  . fluticasone (FLONASE) 50 MCG/ACT nasal spray Place 2 sprays into both nostrils daily. 16 g 11  . HYDROcodone-acetaminophen (NORCO)  10-325 MG tablet Take 1 tablet by mouth every 6 (six) hours as needed for severe pain. 60 tablet 0  . metFORMIN (GLUCOPHAGE) 500 MG tablet TAKE 1 TABLET BY MOUTH TWICE DAILY WITH MEALS 60 tablet 5  . methocarbamol (ROBAXIN) 750 MG tablet Take 1 tablet (750 mg total) by mouth every 8 (eight) hours as needed for muscle spasms. 30 tablet 2  . MILK THISTLE PO Take by mouth.    Marland Kitchen omeprazole (PRILOSEC) 20 MG capsule TAKE 1 CAPSULE BY MOUTH EVERY DAY 30 capsule 5  . PARoxetine (PAXIL) 40 MG tablet TAKE 1 TABLET BY MOUTH EVERY DAY 30 tablet 4   No current facility-administered medications for this visit.     Allergies as of 06/08/2016 - Review Complete 06/08/2016  Allergen Reaction Noted  . Codeine  07/19/2010  . Meperidine hcl  07/19/2010  . Penicillins  07/19/2010  . Zocor [simvastatin]  02/01/2013    Family History  Problem Relation Age of Onset  . Diabetes Father   . Hypertension Father   . Heart attack Father 82    MI  . Stroke Father   . Osteoporosis Mother   . Arthritis Sister   . Arthritis Brother   . Colon cancer Neg Hx   . Liver disease Neg Hx     Social History   Social History  . Marital status: Married    Spouse name: N/A  . Number of children: N/A  . Years of education: N/A   Social History Main Topics  .  Smoking status: Former Smoker    Packs/day: 1.50    Years: 20.00    Types: Cigarettes    Start date: 05/23/1971    Quit date: 06/16/1991  . Smokeless tobacco: Never Used  . Alcohol use No  . Drug use: No  . Sexual activity: Yes    Birth control/ protection: Post-menopausal, Surgical   Other Topics Concern  . None   Social History Narrative  . None    Review of Systems: As mentioned in HPI   Physical Exam: BP (!) 142/73   Pulse 93   Temp 98.2 F (36.8 C) (Oral)   Ht 5' 3"  (1.6 m)   Wt 202 lb 12.8 oz (92 kg)   BMI 35.92 kg/m  General:   Alert and oriented. No distress noted. Pleasant and cooperative.  Head:  Normocephalic and  atraumatic. Eyes:  Conjuctiva clear without scleral icterus. Heart:  S1, S2 present without murmurs, rubs, or gallops. Regular rate and rhythm. Abdomen:  +BS, soft, non-tender and non-distended. No rebound or guarding. No HSM or masses noted. Msk:  Symmetrical without gross deformities. Normal posture. Extremities:  Without edema. Neurologic:  Alert and  oriented x4;  grossly normal neurologically. Psych:  Alert and cooperative. Normal mood and affect.  Lab Results  Component Value Date   WBC 6.6 01/22/2016   HGB 14.8 01/22/2016   HCT 43.1 01/22/2016   MCV 86.0 01/22/2016   PLT 270 01/22/2016   Lab Results  Component Value Date   IRON 131 01/22/2016   TIBC 298 01/22/2016   FERRITIN 639 (H) 04/12/2016   Iron sats: 44

## 2016-06-08 NOTE — Assessment & Plan Note (Signed)
60 year old female with elevated transaminases at 2 times the upper limits of normal consistently over the past 6 months, with viral markers negative, autoimmune, PBC, ceruloplasmin negative, but elevated ferritin and borderline elevation iron sats at 44 with concern for iron overload. Hemochromatosis lab ordered with results of unaffected carrier, persistent elevated transaminases and elastography showing F3/F4 warrant aggressive evaluation. Transjugular liver biopsy discussed with hepatic wedge measurements. Patient is agreeable to this. I feel this is appropriate to come to a definitive diagnosis for further long-term treatment and care. If evidence of advanced fibrosis/cirrhosis, will proceed with upper endoscopy in an elective outpatient setting for variceal screening. Patient is agreeable to pursuing liver biopsy at this time.

## 2016-06-10 ENCOUNTER — Other Ambulatory Visit: Payer: Self-pay | Admitting: Radiology

## 2016-06-13 ENCOUNTER — Encounter: Payer: Self-pay | Admitting: Gastroenterology

## 2016-06-13 ENCOUNTER — Other Ambulatory Visit (HOSPITAL_COMMUNITY): Payer: Self-pay | Admitting: Interventional Radiology

## 2016-06-13 ENCOUNTER — Other Ambulatory Visit: Payer: Self-pay | Admitting: Radiology

## 2016-06-13 ENCOUNTER — Ambulatory Visit (HOSPITAL_COMMUNITY)
Admission: RE | Admit: 2016-06-13 | Discharge: 2016-06-13 | Disposition: A | Discharge status: Home / Self Care | Payer: BLUE CROSS/BLUE SHIELD | Source: Ambulatory Visit | Attending: Gastroenterology | Admitting: Gastroenterology

## 2016-06-13 ENCOUNTER — Encounter (HOSPITAL_COMMUNITY): Payer: Self-pay

## 2016-06-13 DIAGNOSIS — Z823 Family history of stroke: Secondary | ICD-10-CM | POA: Insufficient documentation

## 2016-06-13 DIAGNOSIS — F329 Major depressive disorder, single episode, unspecified: Secondary | ICD-10-CM | POA: Diagnosis not present

## 2016-06-13 DIAGNOSIS — Z8249 Family history of ischemic heart disease and other diseases of the circulatory system: Secondary | ICD-10-CM | POA: Insufficient documentation

## 2016-06-13 DIAGNOSIS — F419 Anxiety disorder, unspecified: Secondary | ICD-10-CM | POA: Diagnosis not present

## 2016-06-13 DIAGNOSIS — R52 Pain, unspecified: Secondary | ICD-10-CM

## 2016-06-13 DIAGNOSIS — Z7984 Long term (current) use of oral hypoglycemic drugs: Secondary | ICD-10-CM | POA: Diagnosis not present

## 2016-06-13 DIAGNOSIS — E119 Type 2 diabetes mellitus without complications: Secondary | ICD-10-CM | POA: Diagnosis not present

## 2016-06-13 DIAGNOSIS — E785 Hyperlipidemia, unspecified: Secondary | ICD-10-CM | POA: Diagnosis not present

## 2016-06-13 DIAGNOSIS — Z880 Allergy status to penicillin: Secondary | ICD-10-CM | POA: Insufficient documentation

## 2016-06-13 DIAGNOSIS — R748 Abnormal levels of other serum enzymes: Secondary | ICD-10-CM | POA: Diagnosis not present

## 2016-06-13 DIAGNOSIS — Z833 Family history of diabetes mellitus: Secondary | ICD-10-CM | POA: Diagnosis not present

## 2016-06-13 DIAGNOSIS — R945 Abnormal results of liver function studies: Secondary | ICD-10-CM

## 2016-06-13 DIAGNOSIS — K7581 Nonalcoholic steatohepatitis (NASH): Secondary | ICD-10-CM | POA: Diagnosis not present

## 2016-06-13 DIAGNOSIS — R7989 Other specified abnormal findings of blood chemistry: Secondary | ICD-10-CM

## 2016-06-13 DIAGNOSIS — Z87891 Personal history of nicotine dependence: Secondary | ICD-10-CM | POA: Insufficient documentation

## 2016-06-13 HISTORY — PX: IR GENERIC HISTORICAL: IMG1180011

## 2016-06-13 LAB — COMPREHENSIVE METABOLIC PANEL
ALT: 118 U/L — AB (ref 14–54)
AST: 118 U/L — AB (ref 15–41)
Albumin: 3.6 g/dL (ref 3.5–5.0)
Alkaline Phosphatase: 65 U/L (ref 38–126)
Anion gap: 9 (ref 5–15)
BUN: 6 mg/dL (ref 6–20)
CHLORIDE: 102 mmol/L (ref 101–111)
CO2: 26 mmol/L (ref 22–32)
CREATININE: 0.64 mg/dL (ref 0.44–1.00)
Calcium: 9.1 mg/dL (ref 8.9–10.3)
GFR calc Af Amer: >60 mL/min (ref >60)
GFR calc non Af Amer: >60 mL/min (ref >60)
Glucose, Bld: 282 mg/dL — ABNORMAL HIGH (ref 65–99)
Potassium: 4.1 mmol/L (ref 3.5–5.1)
SODIUM: 137 mmol/L (ref 135–145)
Total Bilirubin: 0.9 mg/dL (ref 0.3–1.2)
Total Protein: 6.8 g/dL (ref 6.5–8.1)

## 2016-06-13 LAB — CBC
HEMATOCRIT: 40.5 % (ref 36.0–46.0)
Hemoglobin: 14.1 g/dL (ref 12.0–15.0)
MCH: 29.6 pg (ref 26.0–34.0)
MCHC: 34.8 g/dL (ref 30.0–36.0)
MCV: 84.9 fL (ref 78.0–100.0)
PLATELETS: 177 10*3/uL (ref 150–400)
RBC: 4.77 MIL/uL (ref 3.87–5.11)
RDW: 12.2 % (ref 11.5–15.5)
WBC: 4.6 10*3/uL (ref 4.0–10.5)

## 2016-06-13 LAB — PROTIME-INR
INR: 1.03
Prothrombin Time: 13.5 seconds (ref 11.4–15.2)

## 2016-06-13 LAB — APTT: APTT: 30 s (ref 24–36)

## 2016-06-13 MED ORDER — FENTANYL CITRATE (PF) 100 MCG/2ML IJ SOLN
INTRAMUSCULAR | Status: AC | PRN
  Administered 2016-06-13 (×2): 50 ug via INTRAVENOUS

## 2016-06-13 MED ORDER — FENTANYL CITRATE (PF) 100 MCG/2ML IJ SOLN
INTRAMUSCULAR | Status: AC
  Filled 2016-06-13: qty 2

## 2016-06-13 MED ORDER — MIDAZOLAM HCL 2 MG/2ML IJ SOLN
INTRAMUSCULAR | Status: AC | PRN
  Administered 2016-06-13 (×2): 1 mg via INTRAVENOUS

## 2016-06-13 MED ORDER — MIDAZOLAM HCL 2 MG/2ML IJ SOLN
INTRAMUSCULAR | Status: AC
  Filled 2016-06-13: qty 2

## 2016-06-13 MED ORDER — LIDOCAINE HCL 1 % IJ SOLN
INTRAMUSCULAR | Status: AC
  Administered 2016-06-13: 2 mL
  Filled 2016-06-13: qty 20

## 2016-06-13 MED ORDER — ONDANSETRON HCL 4 MG/2ML IJ SOLN
INTRAMUSCULAR | Status: AC | PRN
  Administered 2016-06-13: 4 mg via INTRAVENOUS

## 2016-06-13 MED ORDER — IOPAMIDOL (ISOVUE-300) INJECTION 61%
INTRAVENOUS | Status: AC
  Administered 2016-06-13: 10 mL
  Filled 2016-06-13: qty 50

## 2016-06-13 MED ORDER — ONDANSETRON HCL 4 MG/2ML IJ SOLN
INTRAMUSCULAR | Status: AC
  Filled 2016-06-13: qty 2

## 2016-06-13 MED ORDER — SODIUM CHLORIDE 0.9 % IV SOLN: INTRAVENOUS | Status: DC

## 2016-06-13 NOTE — Sedation Documentation (Signed)
Patient is resting comfortably. 

## 2016-06-13 NOTE — Sedation Documentation (Signed)
Right Hepatic Vein pressure 16 CVP 14/7  (10) Hepatic Vein Wedge pressure 43

## 2016-06-13 NOTE — Consult Note (Signed)
Chief Complaint: Patient was seen in consultation today for image guided transjugular liver biopsy  Referring Physician(s): Annitta Needs  Supervising Physician: Aletta Edouard  Patient Status: Outpatient  History of Present Illness: Catherine Moore is a 60 y.o. female with history of DM, hyperlipidemia, elevated LFT's/serum ferritin, fatty liver by imaging who presents today for transjugular liver biopsy with portal vein pressure measurements for further evaluation.   Past Medical History:  Diagnosis Date  . Anxiety   . Depression   . Diabetes ([place])   . Hyperlipidemia     Past Surgical History:  Procedure Laterality Date  . COLONOSCOPY  08/09/2010   EHO:[id number] rectum colon  . ENDOMETRIAL ABLATION      Allergies: Zocor [simvastatin] and Penicillins  Medications: Prior to Admission medications   Medication Sig Start Date End Date Taking? Authorizing Provider  ALPRAZolam Duanne Moron) 1 MG tablet TAKE 1/2-1 TABLET BY MOUTH TWICE DAILY AS NEEDED 03/28/16  Yes Mikey Kirschner, MD  cetirizine (ZYRTEC) 10 MG tablet TAKE (1) TABLET BY MOUTH ONCE DAILY. 01/05/15  Yes Kathyrn Drown, MD  fluticasone (FLONASE) 50 MCG/ACT nasal spray Place 2 sprays into both nostrils daily. Patient taking differently: Place 2 sprays into both nostrils daily as needed for allergies.  12/26/13  Yes Nilda Simmer, NP  HYDROcodone-acetaminophen (NORCO) 10-325 MG tablet Take 1 tablet by mouth every 6 (six) hours as needed for severe pain. 04/06/16  Yes Nilda Simmer, NP  metFORMIN (GLUCOPHAGE) 500 MG tablet TAKE 1 TABLET BY MOUTH TWICE DAILY WITH MEALS 01/26/16  Yes Nilda Simmer, NP  omeprazole (PRILOSEC) 20 MG capsule TAKE 1 CAPSULE BY MOUTH EVERY DAY 03/28/16  Yes Mikey Kirschner, MD  PARoxetine (PAXIL) 40 MG tablet TAKE 1 TABLET BY MOUTH EVERY DAY 04/25/16  Yes Nilda Simmer, NP     Family History  Problem Relation Age of Onset  . Diabetes Father   . Hypertension Father   . Heart  attack Father 45    MI  . Stroke Father   . Osteoporosis Mother   . Arthritis Sister   . Arthritis Brother   . Colon cancer Neg Hx   . Liver disease Neg Hx     Social History   Social History  . Marital status: Married    Spouse name: N/A  . Number of children: N/A  . Years of education: N/A   Social History Main Topics  . Smoking status: Former Smoker    Packs/day: 1.50    Years: 20.00    Types: Cigarettes    Start date: 05/23/1971    Quit date: 06/16/1991  . Smokeless tobacco: Never Used  . Alcohol use No  . Drug use: No  . Sexual activity: Yes    Birth control/ protection: Post-menopausal, Surgical   Other Topics Concern  . Not on file   Social History Narrative  . No narrative on file      Review of Systems denies fever,CP,dyspnea, cough, abd/back pain,N/V or bleeding  Vital Signs: BP (!) 143/72   Pulse 82   Temp 98.5 F (36.9 C)   Ht 5' 3"  (1.6 m)   Wt 202 lb (91.6 kg)   BMI 35.78 kg/m   Physical Exam awake/alert; chest- CTA bilat; heart- RRR; abd- obese, soft,+BS,NT; ext- FROM, no sig edema  Mallampati Score:     Imaging: No results found.  Labs:  CBC:  Recent Labs  01/22/16 0909  WBC 6.6  HGB 14.8  HCT  43.1  PLT 270    COAGS:  Recent Labs  01/22/16 0909  INR 0.92    BMP:  Recent Labs  10/05/15 0913  NA 141  K 4.0  CL 100  CO2 25  GLUCOSE 200*  BUN 9  CALCIUM 9.2  CREATININE 0.67  GFRNONAA 97  GFRAA 111    LIVER FUNCTION TESTS:  Recent Labs  10/05/15 0914 01/04/16 0912 04/12/16 0900  BILITOT 0.5 0.5 0.4  AST 99* 88* 90*  ALT 79* 108* 81*  ALKPHOS 81 67 72  PROT 6.5 6.7 6.5  ALBUMIN 4.3 4.2 4.1    TUMOR MARKERS: No results for input(s): AFPTM, CEA, CA199, CHROMGRNA in the last 8760 hours.  Assessment and Plan: 60 y.o. female with history of DM, hyperlipidemia, elevated LFT's/serum ferritin, fatty liver by imaging who presents today for transjugular liver biopsy with portal vein pressure  measurements for further evaluation.Risks and benefits discussed with the patient/spouse including, but not limited to bleeding, infection, damage to adjacent structures or low yield requiring additional tests.All of the patient's questions were answered, patient is agreeable to proceed.Consent signed and in chart. LABS PENDING.     Thank you for this interesting consult.  I greatly enjoyed meeting Catherine Moore and look forward to participating in their care.  A copy of this report was sent to the requesting provider on this date.  Electronically Signed: D. Rowe Robert 06/13/2016, 10:35 AM   I spent a total of 25 minutes in face to face in clinical consultation, greater than 50% of which was counseling/coordinating care for image guided transjugular liver biopsy

## 2016-06-13 NOTE — Discharge Instructions (Signed)

## 2016-06-13 NOTE — Sedation Documentation (Signed)
Patient denies pain and is resting comfortably.  

## 2016-06-13 NOTE — Procedures (Signed)
Interventional Radiology Procedure Note  Procedure:  Transjugular liver biopsy with hemodynamics  Complications:  None  Estimated Blood Loss: < 10 mL  Findings:  Normally patent right hepatic vein.  Elevated wedged hepatic venous pressure of 45 mm Hg consistent with portal hypertension. Three 19 G core biopsy samples obtained.  Venetia Night. Kathlene Cote, M.D Pager:  [phone]

## 2016-06-21 ENCOUNTER — Encounter: Payer: Self-pay | Admitting: Gastroenterology

## 2016-06-24 ENCOUNTER — Other Ambulatory Visit: Payer: Self-pay | Admitting: Family Medicine

## 2016-06-26 NOTE — Telephone Encounter (Signed)
Please forward this to Dr. Richardson Landry. Patient on narcotic this should be reviewed by the patient's physician

## 2016-06-27 NOTE — Telephone Encounter (Signed)
Ok plus 5 ref 

## 2016-07-06 ENCOUNTER — Ambulatory Visit: Payer: BLUE CROSS/BLUE SHIELD | Admitting: Nurse Practitioner

## 2016-07-07 ENCOUNTER — Encounter: Payer: Self-pay | Admitting: Nurse Practitioner

## 2016-07-07 ENCOUNTER — Ambulatory Visit (INDEPENDENT_AMBULATORY_CARE_PROVIDER_SITE_OTHER): Payer: BLUE CROSS/BLUE SHIELD | Admitting: Nurse Practitioner

## 2016-07-07 VITALS — BP 132/86 | Ht 63.0 in | Wt 196.0 lb

## 2016-07-07 DIAGNOSIS — Z79891 Long term (current) use of opiate analgesic: Secondary | ICD-10-CM | POA: Diagnosis not present

## 2016-07-07 DIAGNOSIS — K047 Periapical abscess without sinus: Secondary | ICD-10-CM

## 2016-07-07 DIAGNOSIS — M791 Myalgia, unspecified site: Secondary | ICD-10-CM

## 2016-07-07 DIAGNOSIS — Z23 Encounter for immunization: Secondary | ICD-10-CM | POA: Diagnosis not present

## 2016-07-07 DIAGNOSIS — F418 Other specified anxiety disorders: Secondary | ICD-10-CM | POA: Diagnosis not present

## 2016-07-07 DIAGNOSIS — F329 Major depressive disorder, single episode, unspecified: Secondary | ICD-10-CM

## 2016-07-07 DIAGNOSIS — F32A Depression, unspecified: Secondary | ICD-10-CM

## 2016-07-07 DIAGNOSIS — F419 Anxiety disorder, unspecified: Secondary | ICD-10-CM

## 2016-07-07 MED ORDER — HYDROCODONE-ACETAMINOPHEN 10-325 MG PO TABS: 1.0000 | ORAL_TABLET | Freq: Four times a day (QID) | ORAL | 0 refills | Status: DC | PRN

## 2016-07-07 MED ORDER — DULOXETINE HCL 30 MG PO CPEP: 30.0000 mg | ORAL_CAPSULE | Freq: Every day | ORAL | 2 refills | Status: DC

## 2016-07-07 NOTE — Progress Notes (Signed)
Subjective: This patient was seen today for chronic pain  The medication list was reviewed and updated.   -Compliance with medication: yes  - Number patient states they take daily: twice a day  -when was the last dose patient took? Last night  The patient was advised the importance of maintaining medication and not using illegal substances with these.  Refills needed: yes  The patient was educated that we can provide 3 monthly scripts for their medication, it is their responsibility to follow the instructions.  Side effects or complications from medications: none  Patient is aware that pain medications are meant to minimize the severity of the pain to allow their pain levels to improve to allow for better function. They are aware of that pain medications cannot totally remove their pain. Pain is 7/10 and is totally relieved for about 2 hours after taking pain med then gradually comes back.   Due for UDT ( at least once per year) : today [place] reviewed Has constant generalized myalgias as well as fatigue. 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 Currently on Paxil. Experiencing some emotional lability and social isolation. Continues follow up with GI for liver issues. Has an appointment scheduled. Needs Rx hepatitis A and B series. Also requesting antibiotics for an infected tooth. Trying to find a dentist she can afford.   Objective: NAD. Alert, oriented. Lungs clear. Heart RRR.    Assessment:  Problem List Items Addressed This Visit      Other   Anxiety and depression   Encounter for long-term opiate analgesic use - Primary   Relevant Orders   ToxASSURE Select 13 (MW), Urine    Other Visit Diagnoses    Myalgia       Need for vaccination       Relevant Orders   Flu vaccine greater than or equal to 3yo preservative free IM (Completed)   Abscessed tooth           Plan:  Meds ordered this encounter  Medications  . DULoxetine (CYMBALTA) 30 MG capsule    Sig: Take 1  capsule (30 mg total) by mouth daily.    Dispense:  30 capsule    Refill:  2    Order Specific Question:   Supervising Provider    Answer:   Mikey Kirschner [[id number]]  . DISCONTD: HYDROcodone-acetaminophen (NORCO) 10-325 MG tablet    Sig: Take 1 tablet by mouth every 6 (six) hours as needed for severe pain.    Dispense:  60 tablet    Refill:  0    Order Specific Question:   Supervising Provider    Answer:   Mikey Kirschner [[id number]]  . DISCONTD: HYDROcodone-acetaminophen (NORCO) 10-325 MG tablet    Sig: Take 1 tablet by mouth every 6 (six) hours as needed for severe pain.    Dispense:  60 tablet    Refill:  0    May fill 30 days from 07/07/16    Order Specific Question:   Supervising Provider    Answer:   Mikey Kirschner [[id number]]  . HYDROcodone-acetaminophen (NORCO) 10-325 MG tablet    Sig: Take 1 tablet by mouth every 6 (six) hours as needed for severe pain.    Dispense:  60 tablet    Refill:  0    May fill 60 days from 07/07/16    Order Specific Question:   Supervising Provider    Answer:   Mikey Kirschner [[id number]]  . clindamycin (CLEOCIN) 300  MG capsule    Sig: Take 1 capsule (300 mg total) by mouth 3 (three) times daily.    Dispense:  21 capsule    Refill:  0    Order Specific Question:   Supervising Provider    Answer:   Mikey Kirschner [[id number]]   Cut Paxil dose in half x one week then switch to Cymbalta. Call back if any problems. Recommend Cymbalta since paxil is not working as well and Cymbalta may give more relief for myalgias. Given prescription for hepatitis A and hepatitis B vaccine series. Return in about 3 months (around 10/06/2016) for pain management.

## 2016-07-08 ENCOUNTER — Encounter: Payer: Self-pay | Admitting: Nurse Practitioner

## 2016-07-08 MED ORDER — CLINDAMYCIN HCL 300 MG PO CAPS: 300.0000 mg | ORAL_CAPSULE | Freq: Three times a day (TID) | ORAL | 0 refills | Status: DC

## 2016-07-14 LAB — TOXASSURE SELECT 13 (MW), URINE

## 2016-08-03 ENCOUNTER — Other Ambulatory Visit: Payer: Self-pay

## 2016-08-03 ENCOUNTER — Encounter: Payer: Self-pay | Admitting: Internal Medicine

## 2016-08-03 ENCOUNTER — Encounter: Payer: Self-pay | Admitting: Gastroenterology

## 2016-08-03 ENCOUNTER — Ambulatory Visit (INDEPENDENT_AMBULATORY_CARE_PROVIDER_SITE_OTHER): Payer: BLUE CROSS/BLUE SHIELD | Admitting: Gastroenterology

## 2016-08-03 VITALS — BP 143/85 | HR 75 | Temp 97.8°F | Ht 66.0 in | Wt 194.0 lb

## 2016-08-03 DIAGNOSIS — K7581 Nonalcoholic steatohepatitis (NASH): Secondary | ICD-10-CM

## 2016-08-03 DIAGNOSIS — R7989 Other specified abnormal findings of blood chemistry: Secondary | ICD-10-CM | POA: Insufficient documentation

## 2016-08-03 DIAGNOSIS — K7460 Unspecified cirrhosis of liver: Secondary | ICD-10-CM

## 2016-08-03 NOTE — Progress Notes (Signed)
cc'ed to pcp °

## 2016-08-03 NOTE — Patient Instructions (Addendum)
We have scheduled you for an upper endoscopy with Dr. Gala Romney in the near future for screening of esophageal varices.   We will schedule an ultrasound of your liver in December 2017.   I am talking to Hematology about your iron levels.

## 2016-08-03 NOTE — Assessment & Plan Note (Signed)
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 Elevated ferritin likely bystander reaction. Has been thoroughly evaluated to include Hep B, C, autoimmune/PBC, ceruloplasmin, hemochromatosis labs. I have reached out to Hematology regarding elevated ferritin to be thorough for any further recommendations. For now, needs initial variceal screening. Next US abdomen in Dec 2017.    Proceed with upper endoscopy in the near future with Dr. Gala Romney. The risks, benefits, and alternatives have been discussed in detail with patient. They have stated understanding and desire to proceed.  Phenergan 25 mg IV on call us abdomen in Dec 2017 Hep A/B in near future, patient in process of obtaining Return in 6 months

## 2016-08-03 NOTE — Progress Notes (Signed)
Referring Provider: Mikey Kirschner, MD Primary Care Physician:  Mickie Hillier, MD  Primary GI: Dr. Gala Romney   Chief Complaint  Patient presents with  . Follow-up    HPI:   Catherine Moore is a 60 y.o. female presenting today with a history of biopsy-proven NASH and wedge pressures consistent with portal hypertension. Elastography with Metavir F3/F4. She has had persistent elevated transaminases. Negative Hep C antibody, ferritin elevated at 702, ANA negative, Autoimmune, PBC, celiac serologies, Hep B negative. TSH normal. Iron saturation right at 44. 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   She returns today to discuss upper endoscopy for variceal screening. She states she has to pay out of pocket for Hep A/B vaccinations but will be getting this in the near future. She states her toes/legs/feet cramp at night. Chronic shoulder/leg/back pain. Will be seeing arthritis specialist next week. Trying to eat more healthy to include fruits and vegetables. Has researched NASH and feels more knowledgeable about this. No GI complaints.   Past Medical History:  Diagnosis Date  . Anxiety   . Depression   . Diabetes ([place])   . Hyperlipidemia     Past Surgical History:  Procedure Laterality Date  . COLONOSCOPY  08/09/2010   ION:[id number] rectum colon  . ENDOMETRIAL ABLATION    . IR GENERIC HISTORICAL  06/13/2016   IR TRANSCATHETER BX 06/13/2016 [facility]  . IR GENERIC HISTORICAL  06/13/2016   IR US GUIDE VASC ACCESS RIGHT 06/13/2016 [facility]  . IR GENERIC HISTORICAL  06/13/2016   IR VENOGRAM HEPATIC W HEMODYNAMIC EVALUATION 06/13/2016 [facility]    Current Outpatient Prescriptions  Medication Sig Dispense Refill  . ALPRAZolam (XANAX) 1 MG tablet TAKE 1/2 TO 1 TABLET BY MOUTH TWICE DAILY AS NEEDED 60 tablet 5  . cetirizine (ZYRTEC) 10 MG tablet TAKE (1) TABLET BY MOUTH ONCE DAILY. 30 tablet 5  . clindamycin (CLEOCIN) 300 MG capsule Take 1 capsule (300 mg total) by mouth 3 (three)  times daily. 21 capsule 0  . DULoxetine (CYMBALTA) 30 MG capsule Take 1 capsule (30 mg total) by mouth daily. 30 capsule 2  . fluticasone (FLONASE) 50 MCG/ACT nasal spray Place 2 sprays into both nostrils daily. (Patient taking differently: Place 2 sprays into both nostrils daily as needed for allergies. ) 16 g 11  . HYDROcodone-acetaminophen (NORCO) 10-325 MG tablet Take 1 tablet by mouth every 6 (six) hours as needed for severe pain. 60 tablet 0  . omeprazole (PRILOSEC) 20 MG capsule TAKE 1 CAPSULE BY MOUTH EVERY DAY 30 capsule 5   No current facility-administered medications for this visit.     Allergies as of 08/03/2016 - Review Complete 08/03/2016  Allergen Reaction Noted  . Zocor [simvastatin]  02/01/2013  . Penicillins Swelling 07/19/2010    Family History  Problem Relation Age of Onset  . Diabetes Father   . Hypertension Father   . Heart attack Father 67    MI  . Stroke Father   . Osteoporosis Mother   . Arthritis Sister   . Arthritis Brother   . Colon cancer Neg Hx   . Liver disease Neg Hx     Social History   Social History  . Marital status: Married    Spouse name: N/A  . Number of children: N/A  . Years of education: N/A   Social History Main Topics  . Smoking status: Former Smoker    Packs/day: 1.50    Years: 20.00    Types: Cigarettes  Start date: 05/23/1971    Quit date: 06/16/1991  . Smokeless tobacco: Never Used  . Alcohol use No  . Drug use: No  . Sexual activity: Yes    Birth control/ protection: Post-menopausal, Surgical   Other Topics Concern  . None   Social History Narrative  . None    Review of Systems: As mentioned in HPI   Physical Exam: BP (!) 143/85   Pulse 75   Temp 97.8 F (36.6 C) (Oral)   Ht 5' 6"  (1.676 m)   Wt 194 lb (88 kg)   BMI 31.31 kg/m  General:   Alert and oriented. No distress noted. Pleasant and cooperative.  Head:  Normocephalic and atraumatic. Eyes:  Conjuctiva clear without scleral icterus. Mouth:   Oral mucosa pink and moist. Good dentition. No lesions. Heart:  S1, S2 present without murmurs, rubs, or gallops. Regular rate and rhythm. Abdomen:  +BS, soft, non-tender and non-distended. No rebound or guarding. No HSM or masses noted. Msk:  Symmetrical without gross deformities. Normal posture. Extremities:  Without edema. Neurologic:  Alert and  oriented x4;  grossly normal neurologically. Psych:  Alert and cooperative. Normal mood and affect.  Lab Results  Component Value Date   ALT 118 (H) 06/13/2016   AST 118 (H) 06/13/2016   ALKPHOS 65 06/13/2016   BILITOT 0.9 06/13/2016   Lab Results  Component Value Date   CREATININE 0.64 06/13/2016   BUN 6 06/13/2016   NA 137 06/13/2016   K 4.1 06/13/2016   CL 102 06/13/2016   CO2 26 06/13/2016   Lab Results  Component Value Date   WBC 4.6 06/13/2016   HGB 14.1 06/13/2016   HCT 40.5 06/13/2016   MCV 84.9 06/13/2016   PLT 177 06/13/2016   Lab Results  Component Value Date   IRON 131 01/22/2016   TIBC 298 01/22/2016   FERRITIN 639 (H) 04/12/2016

## 2016-08-04 NOTE — Progress Notes (Signed)
Spoke with Kirby Crigler, PA, with Hematology regarding elevated ferritin. She will be seeing Rheumatology due to rheumatoid factor being elevated. This could explain elevated ferritin if she is positive for RA. However, if she is negative for RA, then we could order a sed rate and CRP to further sort out if inflammation is an issue with ferritin. No concern with iron overload due to negative liver biopsy. Hematology will see her IF negative Rheumatology work-up.  In summation: please have her call us with the update from the rheumatologist. If that work-up is negative, we will refer to Hematology.

## 2016-08-09 NOTE — Progress Notes (Signed)
Tried to call pt- NA- voicemail was full and could not leave a message.

## 2016-08-10 DIAGNOSIS — M791 Myalgia: Secondary | ICD-10-CM | POA: Diagnosis not present

## 2016-08-10 DIAGNOSIS — M2550 Pain in unspecified joint: Secondary | ICD-10-CM | POA: Diagnosis not present

## 2016-08-10 DIAGNOSIS — R768 Other specified abnormal immunological findings in serum: Secondary | ICD-10-CM | POA: Diagnosis not present

## 2016-08-10 DIAGNOSIS — R7989 Other specified abnormal findings of blood chemistry: Secondary | ICD-10-CM | POA: Diagnosis not present

## 2016-08-10 DIAGNOSIS — M150 Primary generalized (osteo)arthritis: Secondary | ICD-10-CM | POA: Diagnosis not present

## 2016-08-11 ENCOUNTER — Other Ambulatory Visit: Payer: Self-pay | Admitting: Nurse Practitioner

## 2016-08-11 MED ORDER — DULOXETINE HCL 60 MG PO CPEP: 60.0000 mg | ORAL_CAPSULE | Freq: Every day | ORAL | 2 refills | Status: DC

## 2016-08-11 NOTE — Progress Notes (Signed)
Tried to call pt- NA and mailbox is full and cannot accept messages.

## 2016-08-16 ENCOUNTER — Encounter (HOSPITAL_COMMUNITY)
Admission: RE | Disposition: A | Discharge status: Home / Self Care | Payer: Self-pay | Source: Ambulatory Visit | Attending: Internal Medicine

## 2016-08-16 ENCOUNTER — Encounter (HOSPITAL_COMMUNITY): Payer: Self-pay | Admitting: *Deleted

## 2016-08-16 ENCOUNTER — Ambulatory Visit (HOSPITAL_COMMUNITY)
Admission: RE | Admit: 2016-08-16 | Discharge: 2016-08-16 | Disposition: A | Discharge status: Home / Self Care | Payer: BLUE CROSS/BLUE SHIELD | Source: Ambulatory Visit | Attending: Internal Medicine | Admitting: Internal Medicine

## 2016-08-16 DIAGNOSIS — Z79899 Other long term (current) drug therapy: Secondary | ICD-10-CM | POA: Diagnosis not present

## 2016-08-16 DIAGNOSIS — K7581 Nonalcoholic steatohepatitis (NASH): Secondary | ICD-10-CM | POA: Diagnosis not present

## 2016-08-16 DIAGNOSIS — K317 Polyp of stomach and duodenum: Secondary | ICD-10-CM

## 2016-08-16 DIAGNOSIS — K31819 Angiodysplasia of stomach and duodenum without bleeding: Secondary | ICD-10-CM

## 2016-08-16 DIAGNOSIS — F329 Major depressive disorder, single episode, unspecified: Secondary | ICD-10-CM | POA: Diagnosis not present

## 2016-08-16 DIAGNOSIS — E119 Type 2 diabetes mellitus without complications: Secondary | ICD-10-CM | POA: Diagnosis not present

## 2016-08-16 DIAGNOSIS — Z87891 Personal history of nicotine dependence: Secondary | ICD-10-CM | POA: Insufficient documentation

## 2016-08-16 DIAGNOSIS — F419 Anxiety disorder, unspecified: Secondary | ICD-10-CM | POA: Insufficient documentation

## 2016-08-16 DIAGNOSIS — Z13810 Encounter for screening for upper gastrointestinal disorder: Secondary | ICD-10-CM

## 2016-08-16 DIAGNOSIS — Z1389 Encounter for screening for other disorder: Secondary | ICD-10-CM | POA: Diagnosis not present

## 2016-08-16 DIAGNOSIS — K7460 Unspecified cirrhosis of liver: Secondary | ICD-10-CM

## 2016-08-16 HISTORY — DX: Nausea with vomiting, unspecified: R11.2

## 2016-08-16 HISTORY — DX: Other specified postprocedural states: Z98.890

## 2016-08-16 HISTORY — DX: Unspecified cirrhosis of liver: K74.60

## 2016-08-16 HISTORY — DX: Other complications of anesthesia, initial encounter: T88.59XA

## 2016-08-16 HISTORY — DX: Unspecified osteoarthritis, unspecified site: M19.90

## 2016-08-16 HISTORY — PX: BIOPSY: SHX5522

## 2016-08-16 HISTORY — DX: Adverse effect of unspecified anesthetic, initial encounter: T41.45XA

## 2016-08-16 HISTORY — PX: ESOPHAGOGASTRODUODENOSCOPY: SHX5428

## 2016-08-16 SURGERY — EGD (ESOPHAGOGASTRODUODENOSCOPY)
Anesthesia: Moderate Sedation

## 2016-08-16 MED ORDER — LIDOCAINE VISCOUS 2 % MT SOLN
OROMUCOSAL | Status: DC | PRN
  Administered 2016-08-16: 1 via OROMUCOSAL

## 2016-08-16 MED ORDER — SODIUM CHLORIDE 0.9 % IV SOLN
INTRAVENOUS | Status: DC
  Administered 2016-08-16: 1000 mL via INTRAVENOUS

## 2016-08-16 MED ORDER — LIDOCAINE VISCOUS 2 % MT SOLN
OROMUCOSAL | Status: AC
  Filled 2016-08-16: qty 15

## 2016-08-16 MED ORDER — PROMETHAZINE HCL 25 MG/ML IJ SOLN
25.0000 mg | Freq: Once | INTRAMUSCULAR | Status: AC
  Administered 2016-08-16: 25 mg via INTRAVENOUS

## 2016-08-16 MED ORDER — MEPERIDINE HCL 100 MG/ML IJ SOLN
INTRAMUSCULAR | Status: AC
  Filled 2016-08-16: qty 2

## 2016-08-16 MED ORDER — MIDAZOLAM HCL 5 MG/5ML IJ SOLN
INTRAMUSCULAR | Status: DC | PRN
  Administered 2016-08-16 (×4): 1 mg via INTRAVENOUS

## 2016-08-16 MED ORDER — MEPERIDINE HCL 100 MG/ML IJ SOLN
INTRAMUSCULAR | Status: DC | PRN
  Administered 2016-08-16: 25 mg via INTRAVENOUS

## 2016-08-16 MED ORDER — SODIUM CHLORIDE 0.9% FLUSH
INTRAVENOUS | Status: AC
  Filled 2016-08-16: qty 10

## 2016-08-16 MED ORDER — MIDAZOLAM HCL 5 MG/5ML IJ SOLN
INTRAMUSCULAR | Status: AC
  Filled 2016-08-16: qty 10

## 2016-08-16 MED ORDER — ONDANSETRON HCL 4 MG/2ML IJ SOLN
INTRAMUSCULAR | Status: AC
  Filled 2016-08-16: qty 2

## 2016-08-16 MED ORDER — ONDANSETRON HCL 4 MG/2ML IJ SOLN
INTRAMUSCULAR | Status: DC | PRN
Start: 2016-08-16 — End: 2016-08-16
  Administered 2016-08-16: 4 mg via INTRAVENOUS

## 2016-08-16 MED ORDER — PROMETHAZINE HCL 25 MG/ML IJ SOLN
INTRAMUSCULAR | Status: AC
  Filled 2016-08-16: qty 1

## 2016-08-16 NOTE — Discharge Instructions (Signed)
EGD Discharge instructions Please read the instructions outlined below and refer to this sheet in the next few weeks. These discharge instructions provide you with general information on caring for yourself after you leave the hospital. Your doctor may also give you specific instructions. While your treatment has been planned according to the most current medical practices available, unavoidable complications occasionally occur. If you have any problems or questions after discharge, please call your doctor. ACTIVITY  You may resume your regular activity but move at a slower pace for the next 24 hours.   Take frequent rest periods for the next 24 hours.   Walking will help expel (get rid of) the air and reduce the bloated feeling in your abdomen.   No driving for 24 hours (because of the anesthesia (medicine) used during the test).   You may shower.   Do not sign any important legal documents or operate any machinery for 24 hours (because of the anesthesia used during the test).  NUTRITION  Drink plenty of fluids.   You may resume your normal diet.   Begin with a light meal and progress to your normal diet.   Avoid alcoholic beverages for 24 hours or as instructed by your caregiver.  MEDICATIONS  You may resume your normal medications unless your caregiver tells you otherwise.  WHAT YOU CAN EXPECT TODAY  You may experience abdominal discomfort such as a feeling of fullness or gas pains.  FOLLOW-UP  Your doctor will discuss the results of your test with you.  SEEK IMMEDIATE MEDICAL ATTENTION IF ANY OF THE FOLLOWING OCCUR:  Excessive nausea (feeling sick to your stomach) and/or vomiting.   Severe abdominal pain and distention (swelling).   Trouble swallowing.   Temperature over 101 F (37.8 C).   Rectal bleeding or vomiting of blood.    Further recommendations to follow pending review of pathology report  Keep office visit.  Repeat EGD in 2 years

## 2016-08-16 NOTE — H&P (View-Only) (Signed)
Referring Provider: Mikey Kirschner, MD Primary Care Physician:  Mickie Hillier, MD  Primary GI: Dr. Gala Romney   Chief Complaint  Patient presents with  . Follow-up    HPI:   Catherine Moore is a 60 y.o. female presenting today with a history of biopsy-proven NASH and wedge pressures consistent with portal hypertension. Elastography with Metavir F3/F4. She has had persistent elevated transaminases. Negative Hep C antibody, ferritin elevated at 702, ANA negative, Autoimmune, PBC, celiac serologies, Hep B negative. TSH normal. Iron saturation right at 44. 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   She returns today to discuss upper endoscopy for variceal screening. She states she has to pay out of pocket for Hep A/B vaccinations but will be getting this in the near future. She states her toes/legs/feet cramp at night. Chronic shoulder/leg/back pain. Will be seeing arthritis specialist next week. Trying to eat more healthy to include fruits and vegetables. Has researched NASH and feels more knowledgeable about this. No GI complaints.   Past Medical History:  Diagnosis Date  . Anxiety   . Depression   . Diabetes ([place])   . Hyperlipidemia     Past Surgical History:  Procedure Laterality Date  . COLONOSCOPY  08/09/2010   NID:[id number] rectum colon  . ENDOMETRIAL ABLATION    . IR GENERIC HISTORICAL  06/13/2016   IR TRANSCATHETER BX 06/13/2016 [facility]  . IR GENERIC HISTORICAL  06/13/2016   IR US GUIDE VASC ACCESS RIGHT 06/13/2016 [facility]  . IR GENERIC HISTORICAL  06/13/2016   IR VENOGRAM HEPATIC W HEMODYNAMIC EVALUATION 06/13/2016 [facility]    Current Outpatient Prescriptions  Medication Sig Dispense Refill  . ALPRAZolam (XANAX) 1 MG tablet TAKE 1/2 TO 1 TABLET BY MOUTH TWICE DAILY AS NEEDED 60 tablet 5  . cetirizine (ZYRTEC) 10 MG tablet TAKE (1) TABLET BY MOUTH ONCE DAILY. 30 tablet 5  . clindamycin (CLEOCIN) 300 MG capsule Take 1 capsule (300 mg total) by mouth 3 (three)  times daily. 21 capsule 0  . DULoxetine (CYMBALTA) 30 MG capsule Take 1 capsule (30 mg total) by mouth daily. 30 capsule 2  . fluticasone (FLONASE) 50 MCG/ACT nasal spray Place 2 sprays into both nostrils daily. (Patient taking differently: Place 2 sprays into both nostrils daily as needed for allergies. ) 16 g 11  . HYDROcodone-acetaminophen (NORCO) 10-325 MG tablet Take 1 tablet by mouth every 6 (six) hours as needed for severe pain. 60 tablet 0  . omeprazole (PRILOSEC) 20 MG capsule TAKE 1 CAPSULE BY MOUTH EVERY DAY 30 capsule 5   No current facility-administered medications for this visit.     Allergies as of 08/03/2016 - Review Complete 08/03/2016  Allergen Reaction Noted  . Zocor [simvastatin]  02/01/2013  . Penicillins Swelling 07/19/2010    Family History  Problem Relation Age of Onset  . Diabetes Father   . Hypertension Father   . Heart attack Father 71    MI  . Stroke Father   . Osteoporosis Mother   . Arthritis Sister   . Arthritis Brother   . Colon cancer Neg Hx   . Liver disease Neg Hx     Social History   Social History  . Marital status: Married    Spouse name: N/A  . Number of children: N/A  . Years of education: N/A   Social History Main Topics  . Smoking status: Former Smoker    Packs/day: 1.50    Years: 20.00    Types: Cigarettes  Start date: 05/23/1971    Quit date: 06/16/1991  . Smokeless tobacco: Never Used  . Alcohol use No  . Drug use: No  . Sexual activity: Yes    Birth control/ protection: Post-menopausal, Surgical   Other Topics Concern  . None   Social History Narrative  . None    Review of Systems: As mentioned in HPI   Physical Exam: BP (!) 143/85   Pulse 75   Temp 97.8 F (36.6 C) (Oral)   Ht 5' 6"  (1.676 m)   Wt 194 lb (88 kg)   BMI 31.31 kg/m  General:   Alert and oriented. No distress noted. Pleasant and cooperative.  Head:  Normocephalic and atraumatic. Eyes:  Conjuctiva clear without scleral icterus. Mouth:   Oral mucosa pink and moist. Good dentition. No lesions. Heart:  S1, S2 present without murmurs, rubs, or gallops. Regular rate and rhythm. Abdomen:  +BS, soft, non-tender and non-distended. No rebound or guarding. No HSM or masses noted. Msk:  Symmetrical without gross deformities. Normal posture. Extremities:  Without edema. Neurologic:  Alert and  oriented x4;  grossly normal neurologically. Psych:  Alert and cooperative. Normal mood and affect.  Lab Results  Component Value Date   ALT 118 (H) 06/13/2016   AST 118 (H) 06/13/2016   ALKPHOS 65 06/13/2016   BILITOT 0.9 06/13/2016   Lab Results  Component Value Date   CREATININE 0.64 06/13/2016   BUN 6 06/13/2016   NA 137 06/13/2016   K 4.1 06/13/2016   CL 102 06/13/2016   CO2 26 06/13/2016   Lab Results  Component Value Date   WBC 4.6 06/13/2016   HGB 14.1 06/13/2016   HCT 40.5 06/13/2016   MCV 84.9 06/13/2016   PLT 177 06/13/2016   Lab Results  Component Value Date   IRON 131 01/22/2016   TIBC 298 01/22/2016   FERRITIN 639 (H) 04/12/2016

## 2016-08-16 NOTE — Interval H&P Note (Signed)
History and Physical Interval Note:  08/16/2016 7:30 AM  Catherine Moore  has presented today for surgery, with the diagnosis of cirrhosis  The various methods of treatment have been discussed with the patient and family. After consideration of risks, benefits and other options for treatment, the patient has consented to  Procedure(s) with comments: ESOPHAGOGASTRODUODENOSCOPY (EGD) (N/A) - 7:30 am as a surgical intervention .  The patient's history has been reviewed, patient examined, no change in status, stable for surgery.  I have reviewed the patient's chart and labs.  Questions were answered to the patient's satisfaction.     Shylyn Younce  No change. No dysphagia. Screening EGD per plan. The risks, benefits, limitations, alternatives and imponderables have been reviewed with the patient. Potential for esophageal dilation, biopsy, etc. have also been reviewed.  Questions have been answered. All parties agreeable.

## 2016-08-16 NOTE — Progress Notes (Signed)
Tried to call pt- NA 

## 2016-08-16 NOTE — Op Note (Signed)
[facility] Patient Name: Catherine Moore Procedure Date: 08/16/2016 7:09 AM MRN: [id number] Date of Birth: [birth year] Attending MD: Norvel Richards , MD CSN: [id number] Age: 60 Admit Type: Outpatient Procedure:                Upper GI endoscopy with biopsy Indications:              Screening procedure Providers:                Norvel Richards, MD, Lurline Del, RN, Randa Spike, Technician Referring MD:              Medicines:                Midazolam 4 mg IV, Meperidine 25 mg IV Complications:            No immediate complications. Estimated Blood Loss:     Estimated blood loss was minimal. Procedure:                Pre-Anesthesia Assessment:                           - Prior to the procedure, a History and Physical                            was performed, and patient medications and                            allergies were reviewed. The patient's tolerance of                            previous anesthesia was also reviewed. The risks                            and benefits of the procedure and the sedation                            options and risks were discussed with the patient.                            All questions were answered, and informed consent                            was obtained. Prior Anticoagulants: The patient has                            taken no previous anticoagulant or antiplatelet                            agents. ASA Grade Assessment: III - A patient with                            severe systemic disease. After reviewing the risks  and benefits, the patient was deemed in                            satisfactory condition to undergo the procedure.                           After obtaining informed consent, the endoscope was                            passed under direct vision. Throughout the                            procedure, the patient's blood pressure, pulse, and              oxygen saturations were monitored continuously. The                            [id number] ([id number]) scope was introduced through the                            mouth, and advanced to the second part of duodenum.                            The upper GI endoscopy was accomplished without                            difficulty. The patient tolerated the procedure                            well. Scope In: 7:45:57 AM Scope Out: 7:50:34 AM Total Procedure Duration: 0 hours 4 minutes 37 seconds  Findings:      The examined esophagus was normal.      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      Multiple 4 mm pedunculated and sessile polyps with no stigmata of recent       bleeding were found in the stomach. This was biopsied with a cold       forceps for histology. Estimated blood loss was minimal.      The duodenal bulb and second portion of the duodenum were normal. Impression:               - Normal esophagus.                           - Gastric antral vascular ectasia.                           - Multiple gastric polyps. Biopsied.                           - Normal duodenal bulb and second portion of the                            duodenum. Moderate Sedation:      Moderate (conscious) sedation was administered by the endoscopy nurse       and supervised  by the endoscopist. The following parameters were       monitored: oxygen saturation, heart rate, blood pressure, respiratory       rate, EKG, adequacy of pulmonary ventilation, and response to care.       Total physician intraservice time was 15 minutes. Recommendation:           - Patient has a contact number available for                            emergencies. The signs and symptoms of potential                            delayed complications were discussed with the                            patient. Return to normal activities tomorrow.                            Written discharge instructions  were provided to the                            patient.                           - Resume previous diet.                           - Continue present medications.                           - Await pathology results.                           - Repeat upper endoscopy in 2 years for screening                            purposes.                           - Return to GI office after studies are complete. Procedure Code(s):        --- Professional ---                           [phone], Esophagogastroduodenoscopy, flexible,                            transoral; with biopsy, single or multiple                           99152, Moderate sedation services provided by the                            same physician or other qualified health care                            professional performing the diagnostic or  therapeutic service that the sedation supports,                            requiring the presence of an independent trained                            observer to assist in the monitoring of the                            patient's level of consciousness and physiological                            status; initial 15 minutes of intraservice time,                            patient age 63 years or older Diagnosis Code(s):        --- Professional ---                           K31.819, Angiodysplasia of stomach and duodenum                            without bleeding                           K31.7, Polyp of stomach and duodenum                           Z13.810, Encounter for screening for upper                            gastrointestinal disorder CPT copyright 2016 American Medical Association. All rights reserved. The codes documented in this report are preliminary and upon coder review may  be revised to meet current compliance requirements. Cristopher Estimable. Zuriyah Shatz, MD Norvel Richards, MD 08/16/2016 7:58:32 AM This report has been signed electronically. Number of  Addenda: 0

## 2016-08-17 ENCOUNTER — Encounter (HOSPITAL_COMMUNITY): Payer: Self-pay | Admitting: Internal Medicine

## 2016-08-18 NOTE — Progress Notes (Signed)
I have sent the pt a message via mychart.

## 2016-08-21 ENCOUNTER — Encounter: Payer: Self-pay | Admitting: Internal Medicine

## 2016-08-22 ENCOUNTER — Telehealth: Payer: Self-pay | Admitting: Gastroenterology

## 2016-08-22 NOTE — Telephone Encounter (Signed)
Catherine Moore:  Can we request office notes from Dr. Amil Amen in [place], Rheumatology. Thanks!

## 2016-08-23 NOTE — Telephone Encounter (Signed)
Ok sounds good. Thanks.

## 2016-08-23 NOTE — Telephone Encounter (Signed)
If we didn't refer her there I will need a signed release before they will send [place] anything. I will try and I will mail patient a release just in case.

## 2016-08-25 ENCOUNTER — Telehealth: Payer: Self-pay

## 2016-08-25 NOTE — Telephone Encounter (Signed)
[facility] called and said they received a request for medical records, but they need a signed release faxed to them.

## 2016-08-29 ENCOUNTER — Telehealth: Payer: Self-pay | Admitting: Internal Medicine

## 2016-08-29 NOTE — Telephone Encounter (Signed)
Letter mailed

## 2016-08-29 NOTE — Telephone Encounter (Signed)
Recall for abd ultrasound

## 2016-08-30 ENCOUNTER — Encounter: Payer: BLUE CROSS/BLUE SHIELD | Admitting: Nurse Practitioner

## 2016-08-30 NOTE — Telephone Encounter (Signed)
I have mailed the patient another release to sign and explained to her that we can't get the records without it.

## 2016-08-31 ENCOUNTER — Telehealth: Payer: Self-pay

## 2016-08-31 NOTE — Telephone Encounter (Signed)
Pt was sent a letter to schedule [place] (she was on recall list). She called office to see if she needs an [place] at this time. She had a IR Transcatheter Bx 06/13/16, result note said [place] in 6 months. Does she need [place] at this time?

## 2016-09-05 ENCOUNTER — Other Ambulatory Visit: Payer: Self-pay

## 2016-09-05 DIAGNOSIS — Z129 Encounter for screening for malignant neoplasm, site unspecified: Secondary | ICD-10-CM

## 2016-09-05 DIAGNOSIS — K7581 Nonalcoholic steatohepatitis (NASH): Principal | ICD-10-CM

## 2016-09-05 DIAGNOSIS — K7460 Unspecified cirrhosis of liver: Secondary | ICD-10-CM

## 2016-09-05 NOTE — Telephone Encounter (Signed)
Yes, unfortunately still needs US abdomen. This is screening for liver cancer, and we need to see the whole liver. The biopsy didn't include imaging of the entire liver. Just needs an [place] limited RUQ

## 2016-09-06 NOTE — Telephone Encounter (Signed)
Called and informed pt of AB's recommendation. She is ok with scheduling Ultrasound. Ultrasound scheduled for 09/13/16 at 8:30am. NPO after midnight. Called and informed pt. Letter also mailed.

## 2016-09-07 ENCOUNTER — Encounter: Payer: Self-pay | Admitting: Nurse Practitioner

## 2016-09-07 ENCOUNTER — Ambulatory Visit (INDEPENDENT_AMBULATORY_CARE_PROVIDER_SITE_OTHER): Payer: BLUE CROSS/BLUE SHIELD | Admitting: Nurse Practitioner

## 2016-09-07 ENCOUNTER — Other Ambulatory Visit: Payer: Self-pay | Admitting: Nurse Practitioner

## 2016-09-07 VITALS — BP 122/82 | Ht 66.0 in | Wt 185.4 lb

## 2016-09-07 DIAGNOSIS — Z1151 Encounter for screening for human papillomavirus (HPV): Secondary | ICD-10-CM

## 2016-09-07 DIAGNOSIS — Z124 Encounter for screening for malignant neoplasm of cervix: Secondary | ICD-10-CM | POA: Diagnosis not present

## 2016-09-07 DIAGNOSIS — Z1231 Encounter for screening mammogram for malignant neoplasm of breast: Secondary | ICD-10-CM

## 2016-09-07 DIAGNOSIS — Z780 Asymptomatic menopausal state: Secondary | ICD-10-CM

## 2016-09-07 DIAGNOSIS — Z0000 Encounter for general adult medical examination without abnormal findings: Secondary | ICD-10-CM

## 2016-09-08 LAB — BASIC METABOLIC PANEL
BUN/Creatinine Ratio: 10 — ABNORMAL LOW (ref 12–28)
BUN: 8 mg/dL (ref 8–27)
CHLORIDE: 95 mmol/L — AB (ref 96–106)
CO2: 28 mmol/L (ref 18–29)
Calcium: 9.6 mg/dL (ref 8.7–10.3)
Creatinine, Ser: 0.82 mg/dL (ref 0.57–1.00)
GFR, EST AFRICAN AMERICAN: 90 mL/min/{1.73_m2} (ref >59)
GFR, EST NON AFRICAN AMERICAN: 78 mL/min/{1.73_m2} (ref >59)
Glucose: 324 mg/dL — ABNORMAL HIGH (ref 65–99)
POTASSIUM: 4.6 mmol/L (ref 3.5–5.2)
SODIUM: 138 mmol/L (ref 134–144)

## 2016-09-08 LAB — HEPATIC FUNCTION PANEL
ALK PHOS: 86 IU/L (ref 39–117)
ALT: 106 IU/L — AB (ref 0–32)
AST: 88 IU/L — ABNORMAL HIGH (ref 0–40)
Albumin: 4.3 g/dL (ref 3.6–4.8)
BILIRUBIN, DIRECT: 0.17 mg/dL (ref 0.00–0.40)
Bilirubin Total: 0.8 mg/dL (ref 0.0–1.2)
TOTAL PROTEIN: 7.2 g/dL (ref 6.0–8.5)

## 2016-09-08 LAB — HEMOGLOBIN A1C
ESTIMATED AVERAGE GLUCOSE: 301 mg/dL
HEMOGLOBIN A1C: 12.1 % — AB (ref 4.8–5.6)

## 2016-09-08 LAB — LIPID PANEL
CHOL/HDL RATIO: 8.7 ratio — AB (ref 0.0–4.4)
Cholesterol, Total: 305 mg/dL — ABNORMAL HIGH (ref 100–199)
HDL: 35 mg/dL — AB (ref >39)
LDL Calculated: 232 mg/dL — ABNORMAL HIGH (ref 0–99)
TRIGLYCERIDES: 192 mg/dL — AB (ref 0–149)
VLDL CHOLESTEROL CAL: 38 mg/dL (ref 5–40)

## 2016-09-08 LAB — VITAMIN D 25 HYDROXY (VIT D DEFICIENCY, FRACTURES): Vit D, 25-Hydroxy: 33.6 ng/mL (ref 30.0–100.0)

## 2016-09-09 ENCOUNTER — Encounter: Payer: Self-pay | Admitting: Nurse Practitioner

## 2016-09-09 NOTE — Progress Notes (Signed)
   Subjective:    Patient ID: Catherine Moore, female    DOB: 12-Jul-1956, 60 y.o.   MRN: [id number]  HPI Presents for her wellness exam. No vaginal bleeding or pelvic pain. Same sexual partner. Struggling with daily persistent joint and muscle pain. Average pain in 8/10. Pain med brings it down to tolerable 5/10. Still following up with GI specialist. Has several tests coming up. Has seen endocrinology. The only thing she has been told so far is that she does not have fibromyalgia. Regular eye exams, had one recently. No dental exams. Limited activity due to pain. Worse on certain days, depends on activity. Has had Zostavax vaccine.     Review of Systems  Constitutional: Positive for fatigue. Negative for activity change, appetite change and fever.  HENT: Negative for dental problem, ear pain, sinus pressure and sore throat.   Respiratory: Negative for cough, chest tightness, shortness of breath and wheezing.   Cardiovascular: Negative for chest pain.  Gastrointestinal: Negative for abdominal distention, abdominal pain, blood in stool, constipation, diarrhea, nausea and vomiting.  Genitourinary: Negative for difficulty urinating, dysuria, enuresis, frequency, genital sores, pelvic pain, urgency, vaginal bleeding and vaginal discharge.       Objective:   Physical Exam  Constitutional: She is oriented to person, place, and time. She appears well-developed. No distress.  HENT:  Right Ear: External ear normal.  Left Ear: External ear normal.  Mouth/Throat: Oropharynx is clear and moist.  Neck: Normal range of motion. Neck supple. No tracheal deviation present. No thyromegaly present.  Cardiovascular: Normal rate, regular rhythm and normal heart sounds.  Exam reveals no gallop.   No murmur heard. Pulmonary/Chest: Effort normal and breath sounds normal.  Abdominal: Soft. She exhibits no distension. There is no tenderness.  Genitourinary: Vagina normal and uterus normal. No vaginal discharge  found.  Genitourinary Comments: External GU: no rashes or lesions. Vagina: no discharge. Cervix normal in appearance. No CMT. Bimanual exam: no tenderness or obvious masses. Rectal exam: no masses or stool for hemoccult.    Musculoskeletal: She exhibits no edema.  Lymphadenopathy:    She has no cervical adenopathy.  Neurological: She is alert and oriented to person, place, and time.  Skin: Skin is warm and dry. No rash noted.  Psychiatric: She has a normal mood and affect. Her behavior is normal.  Vitals reviewed. Breast exam: areas of dense tissue; no masses. Axillae no adenopathy.        Assessment & Plan:  Routine general medical examination at a health care facility - Plan: Pap IG and HPV (high risk) DNA detection, Lipid panel, Hepatic function panel, HgB M0H, Basic metabolic panel, VITAMIN D 25 Hydroxy (Vit-D Deficiency, Fractures), MM DIGITAL SCREENING BILATERAL, CANCELED: DG Bone Density  Screening for cervical cancer - Plan: Pap IG and HPV (high risk) DNA detection  Screening for HPV (human papillomavirus) - Plan: Pap IG and HPV (high risk) DNA detection  Postmenopausal - Plan: DG Bone Density  Recommend daily vitamin D and calcium. Labs pending. Follow up with specialists as planned.  Return in about 1 year (around 09/07/2017) for physical. Follow up for pain management as planned. Go ahead and increase to 45 per month. Will address again at next appointment.

## 2016-09-13 ENCOUNTER — Ambulatory Visit (HOSPITAL_COMMUNITY)
Admission: RE | Admit: 2016-09-13 | Discharge: 2016-09-13 | Disposition: A | Discharge status: Home / Self Care | Payer: BLUE CROSS/BLUE SHIELD | Source: Ambulatory Visit | Attending: Gastroenterology | Admitting: Gastroenterology

## 2016-09-13 DIAGNOSIS — Z129 Encounter for screening for malignant neoplasm, site unspecified: Secondary | ICD-10-CM | POA: Diagnosis not present

## 2016-09-13 DIAGNOSIS — K7460 Unspecified cirrhosis of liver: Secondary | ICD-10-CM | POA: Diagnosis not present

## 2016-09-13 DIAGNOSIS — K7581 Nonalcoholic steatohepatitis (NASH): Secondary | ICD-10-CM | POA: Diagnosis not present

## 2016-09-13 DIAGNOSIS — K8020 Calculus of gallbladder without cholecystitis without obstruction: Secondary | ICD-10-CM | POA: Diagnosis not present

## 2016-09-14 NOTE — Telephone Encounter (Signed)
Did we ever get the release of information signed back from patient?

## 2016-09-14 NOTE — Telephone Encounter (Signed)
I have not received anything yet

## 2016-09-14 NOTE — Progress Notes (Signed)
Ultrasound shows cirrhosis, no other concerning features. Although there are multiple gallstones, if you aren't having any abdominal pain, you don't need a surgical referral. We will just monitor your symptoms. Ultrasound in 6 months.

## 2016-09-15 LAB — PAP IG AND HPV HIGH-RISK
HPV, HIGH-RISK: NEGATIVE
PAP Smear Comment: 0

## 2016-09-20 ENCOUNTER — Encounter: Payer: Self-pay | Admitting: Nurse Practitioner

## 2016-09-20 ENCOUNTER — Ambulatory Visit: Payer: BLUE CROSS/BLUE SHIELD | Admitting: Nurse Practitioner

## 2016-09-20 ENCOUNTER — Ambulatory Visit (INDEPENDENT_AMBULATORY_CARE_PROVIDER_SITE_OTHER): Payer: BLUE CROSS/BLUE SHIELD | Admitting: Nurse Practitioner

## 2016-09-20 VITALS — BP 136/82 | Ht 66.0 in | Wt 186.2 lb

## 2016-09-20 DIAGNOSIS — M2550 Pain in unspecified joint: Secondary | ICD-10-CM

## 2016-09-20 DIAGNOSIS — E119 Type 2 diabetes mellitus without complications: Secondary | ICD-10-CM

## 2016-09-20 DIAGNOSIS — E785 Hyperlipidemia, unspecified: Secondary | ICD-10-CM | POA: Diagnosis not present

## 2016-09-20 MED ORDER — GLIPIZIDE ER 5 MG PO TB24: 5.0000 mg | ORAL_TABLET | Freq: Every day | ORAL | 5 refills | Status: DC

## 2016-09-20 MED ORDER — METFORMIN HCL 500 MG PO TABS
500.0000 mg | ORAL_TABLET | Freq: Two times a day (BID) | ORAL | 5 refills | Status: DC
Start: 2016-09-20 — End: 2017-03-15

## 2016-09-22 ENCOUNTER — Other Ambulatory Visit: Payer: Self-pay | Admitting: Nurse Practitioner

## 2016-09-22 ENCOUNTER — Other Ambulatory Visit: Payer: Self-pay | Admitting: Family Medicine

## 2016-09-22 ENCOUNTER — Encounter: Payer: Self-pay | Admitting: Nurse Practitioner

## 2016-09-22 DIAGNOSIS — E785 Hyperlipidemia, unspecified: Secondary | ICD-10-CM

## 2016-09-22 DIAGNOSIS — R748 Abnormal levels of other serum enzymes: Secondary | ICD-10-CM

## 2016-09-22 DIAGNOSIS — M2550 Pain in unspecified joint: Secondary | ICD-10-CM

## 2016-09-22 DIAGNOSIS — Z79899 Other long term (current) drug therapy: Secondary | ICD-10-CM

## 2016-09-22 MED ORDER — ROSUVASTATIN CALCIUM 10 MG PO TABS
10.0000 mg | ORAL_TABLET | Freq: Every day | ORAL | 2 refills | Status: DC
Start: 2016-09-22 — End: 2016-12-21

## 2016-09-22 NOTE — Progress Notes (Signed)
Tried to call no answer (voicemail box is full) 09/22/16

## 2016-09-22 NOTE — Progress Notes (Signed)
Subjective:  Presents to review her recent lab work. Has been doing well with her diet. No chest pain/ischemic type pain or shortness of breath. Continues to struggle with significant joint pain. Has seen rheumatologist, states she was diagnosed with rheumatoid arthritis but no medicines were given due to elevated liver enzymes.  Objective:   BP 136/82   Ht 5' 6"  (1.676 m)   Wt 186 lb 3.2 oz (84.5 kg)   BMI 30.05 kg/m  NAD. Alert, oriented. Reviewed lab work with patient dated 09/07/2016.    Assessment:  Problem List Items Addressed This Visit      Endocrine   Type 2 diabetes mellitus not at goal [facility]) - Primary   Relevant Medications   metFORMIN (GLUCOPHAGE) 500 MG tablet   glipiZIDE (GLUCOTROL XL) 5 MG 24 hr tablet     Other   Arthralgia   Hyperlipemia     Plan: Discussed importance of getting her sugar and lipids back under control. Patient responded extremely well to Crestor 10 mg. Her lab work was normal while on this. Meds ordered this encounter  Medications  . metFORMIN (GLUCOPHAGE) 500 MG tablet    Sig: Take 1 tablet (500 mg total) by mouth 2 (two) times daily with a meal.    Dispense:  60 tablet    Refill:  5    Order Specific Question:   Supervising Provider    Answer:   Mikey Kirschner [[id number]]  . glipiZIDE (GLUCOTROL XL) 5 MG 24 hr tablet    Sig: Take 1 tablet (5 mg total) by mouth daily with breakfast.    Dispense:  30 tablet    Refill:  5    Order Specific Question:   Supervising Provider    Answer:   Maggie Font   Given prescription for glucose testing meter supplies, check blood sugar once a day and bring to next visit in a few weeks. Defers pneumonia vaccine urine microalbumin and foot exam until that time. Will check with gastroenterology about restarting Crestor considering her elevated liver enzymes. Follow-up in a few weeks as planned, call back sooner if needed.

## 2016-09-22 NOTE — Progress Notes (Signed)
Spoke with patient and informed her per [facility] with GI. Catherine Moore is going to restart you on previous dose of Crestor. She has ordered your labs to be done in 8 weeks. Call back if any problems. Patient verbalized understanding.

## 2016-09-26 ENCOUNTER — Encounter: Payer: Self-pay | Admitting: Family Medicine

## 2016-09-28 ENCOUNTER — Ambulatory Visit (HOSPITAL_COMMUNITY)
Admission: RE | Admit: 2016-09-28 | Discharge: 2016-09-28 | Disposition: A | Discharge status: Home / Self Care | Payer: BLUE CROSS/BLUE SHIELD | Source: Ambulatory Visit | Attending: Nurse Practitioner | Admitting: Nurse Practitioner

## 2016-09-28 ENCOUNTER — Other Ambulatory Visit: Payer: Self-pay | Admitting: Nurse Practitioner

## 2016-09-28 DIAGNOSIS — Z1231 Encounter for screening mammogram for malignant neoplasm of breast: Secondary | ICD-10-CM | POA: Diagnosis not present

## 2016-09-28 DIAGNOSIS — Z780 Asymptomatic menopausal state: Secondary | ICD-10-CM | POA: Diagnosis not present

## 2016-10-04 ENCOUNTER — Encounter: Payer: Self-pay | Admitting: Nurse Practitioner

## 2016-10-04 ENCOUNTER — Ambulatory Visit (INDEPENDENT_AMBULATORY_CARE_PROVIDER_SITE_OTHER): Payer: BLUE CROSS/BLUE SHIELD | Admitting: Nurse Practitioner

## 2016-10-04 VITALS — BP 120/70 | Ht 66.0 in | Wt 183.0 lb

## 2016-10-04 DIAGNOSIS — E119 Type 2 diabetes mellitus without complications: Secondary | ICD-10-CM | POA: Diagnosis not present

## 2016-10-04 DIAGNOSIS — M2550 Pain in unspecified joint: Secondary | ICD-10-CM

## 2016-10-04 DIAGNOSIS — Z23 Encounter for immunization: Secondary | ICD-10-CM

## 2016-10-04 DIAGNOSIS — Z79891 Long term (current) use of opiate analgesic: Secondary | ICD-10-CM | POA: Diagnosis not present

## 2016-10-04 DIAGNOSIS — M791 Myalgia, unspecified site: Secondary | ICD-10-CM

## 2016-10-04 MED ORDER — HYDROCODONE-ACETAMINOPHEN 10-325 MG PO TABS: 1.0000 | ORAL_TABLET | Freq: Four times a day (QID) | ORAL | 0 refills | Status: DC | PRN

## 2016-10-04 NOTE — Progress Notes (Signed)
Subjective: This patient was seen today for chronic pain  The medication list was reviewed and updated.   -Compliance with medication: yes  - Number patient states they take daily: twice; once in the am and pm   -when was the last dose patient took? Last night  The patient was advised the importance of maintaining medication and not using illegal substances with these.  Refills needed: yes  The patient was educated that we can provide 3 monthly scripts for their medication, it is their responsibility to follow the instructions.  Side effects or complications from medications: none  Patient is aware that pain medications are meant to minimize the severity of the pain to allow their pain levels to improve to allow for better function. They are aware of that pain medications cannot totally remove their pain.  Due for UDT ( at least once per year) : UTD  NCCSR reviewed  Has an appointment at [facility] in February for rheumatology. Was told she has RA by another provider but could not get treatment due to liver issues. Diffuse joint and muscle pain. BS running mid 100s fasting. Has started back on meds for diabetes and cholesterol. Pain in AM 8/10 comes down to 4/10 after pain med. Lasts about 4 hours. Has pain during the day. Takes second dose at bedtime. Sleeping well at night. Also taking Xanax without any problems.   Objective: NAD. Alert, oriented. Lungs clear. Heart RRR.  Diabetic Foot Exam - Simple   Simple Foot Form Visual Inspection No deformities, no ulcerations, no other skin breakdown bilaterally:  Yes Sensation Testing Intact to touch and monofilament testing bilaterally:  Yes Pulse Check Comments DP pulses present bilat; skin very dry but intact    Assessment:  Problem List Items Addressed This Visit      Endocrine   Type 2 diabetes mellitus not at goal [facility])   Relevant Orders   Microalbumin / creatinine urine ratio     Other   Arthralgia   Encounter for  long-term opiate analgesic use - Primary    Other Visit Diagnoses    Myalgia       Need for vaccination       Relevant Orders   Pneumococcal polysaccharide vaccine 23-valent greater than or equal to 2yo subcutaneous/IM (Completed)     Plan:  Meds ordered this encounter  Medications  . DISCONTD: HYDROcodone-acetaminophen (NORCO) 10-325 MG tablet    Sig: Take 1 tablet by mouth every 6 (six) hours as needed for severe pain.    Dispense:  90 tablet    Refill:  0    May fill 60 days from 10/04/16    Order Specific Question:   Supervising Provider    Answer:   Mikey Kirschner [[id number]]  . DISCONTD: HYDROcodone-acetaminophen (NORCO) 10-325 MG tablet    Sig: Take 1 tablet by mouth every 6 (six) hours as needed for severe pain.    Dispense:  90 tablet    Refill:  0    May fill 30 days from 10/04/16    Order Specific Question:   Supervising Provider    Answer:   Mikey Kirschner [[id number]]  . HYDROcodone-acetaminophen (NORCO) 10-325 MG tablet    Sig: Take 1 tablet by mouth every 6 (six) hours as needed for severe pain.    Dispense:  90 tablet    Refill:  0    Order Specific Question:   Supervising Provider    Answer:   Maggie Font  Due to the level of pain, will increase to TID dosing for now, hopefully with a goal of decreasing over time once she has been evaluated by rheumatology. Discussed importance of limited sugar and simple carbs in diet. Return in about 3 months (around 01/02/2017) for pain management.

## 2016-10-05 LAB — MICROALBUMIN / CREATININE URINE RATIO
Creatinine, Urine: 87.8 mg/dL
Microalb/Creat Ratio: 12.3 mg/g{creat} (ref 0.0–30.0)
Microalbumin, Urine: 10.8 ug/mL

## 2016-10-18 ENCOUNTER — Telehealth: Payer: Self-pay | Admitting: Nurse Practitioner

## 2016-10-18 ENCOUNTER — Other Ambulatory Visit: Payer: Self-pay | Admitting: Nurse Practitioner

## 2016-10-18 MED ORDER — FLUTICASONE PROPIONATE 50 MCG/ACT NA SUSP: 2.0000 | Freq: Every day | NASAL | 11 refills | Status: DC

## 2016-10-18 MED ORDER — AZITHROMYCIN 250 MG PO TABS: ORAL_TABLET | ORAL | 0 refills | Status: DC

## 2016-10-18 NOTE — Telephone Encounter (Signed)
Phone contact from patient with c/o congestion for over a week; now producing green mucus. Will send in antibiotic and flonase. Office visit if no improvement.

## 2016-10-24 ENCOUNTER — Other Ambulatory Visit: Payer: Self-pay | Admitting: Nurse Practitioner

## 2016-11-01 ENCOUNTER — Other Ambulatory Visit: Payer: Self-pay

## 2016-11-01 DIAGNOSIS — R7989 Other specified abnormal findings of blood chemistry: Secondary | ICD-10-CM

## 2016-11-01 NOTE — Telephone Encounter (Signed)
Referral has been made.

## 2016-11-01 NOTE — Telephone Encounter (Signed)
Records reviewed from Dr. Amil Amen. He felt she may have hemochromatosis. The hemochromatosis DNA we did showed likely an unaffected carrier. Her liver biopsy iron stain was normal. I think she needs referral to hematology for more extensive work-up and ensure everything has been evaluated. Please refer to hematology. From what I can see in the records, Dr. Amil Amen did not believe she had RA.

## 2016-11-08 ENCOUNTER — Other Ambulatory Visit: Payer: Self-pay

## 2016-11-24 ENCOUNTER — Ambulatory Visit (HOSPITAL_COMMUNITY): Payer: BLUE CROSS/BLUE SHIELD | Admitting: Hematology & Oncology

## 2016-12-08 DIAGNOSIS — H04123 Dry eye syndrome of bilateral lacrimal glands: Secondary | ICD-10-CM | POA: Diagnosis not present

## 2016-12-08 DIAGNOSIS — R5382 Chronic fatigue, unspecified: Secondary | ICD-10-CM | POA: Diagnosis not present

## 2016-12-08 DIAGNOSIS — M069 Rheumatoid arthritis, unspecified: Secondary | ICD-10-CM | POA: Diagnosis not present

## 2016-12-08 DIAGNOSIS — E119 Type 2 diabetes mellitus without complications: Secondary | ICD-10-CM | POA: Diagnosis not present

## 2016-12-08 DIAGNOSIS — Z881 Allergy status to other antibiotic agents status: Secondary | ICD-10-CM | POA: Diagnosis not present

## 2016-12-08 DIAGNOSIS — Z7984 Long term (current) use of oral hypoglycemic drugs: Secondary | ICD-10-CM | POA: Diagnosis not present

## 2016-12-08 DIAGNOSIS — M159 Polyosteoarthritis, unspecified: Secondary | ICD-10-CM | POA: Diagnosis not present

## 2016-12-08 DIAGNOSIS — Z880 Allergy status to penicillin: Secondary | ICD-10-CM | POA: Diagnosis not present

## 2016-12-08 DIAGNOSIS — M791 Myalgia: Secondary | ICD-10-CM | POA: Diagnosis not present

## 2016-12-08 DIAGNOSIS — M79642 Pain in left hand: Secondary | ICD-10-CM | POA: Diagnosis not present

## 2016-12-08 DIAGNOSIS — M19032 Primary osteoarthritis, left wrist: Secondary | ICD-10-CM | POA: Diagnosis not present

## 2016-12-08 DIAGNOSIS — M19041 Primary osteoarthritis, right hand: Secondary | ICD-10-CM | POA: Diagnosis not present

## 2016-12-08 DIAGNOSIS — Z79899 Other long term (current) drug therapy: Secondary | ICD-10-CM | POA: Diagnosis not present

## 2016-12-08 DIAGNOSIS — M3500 Sicca syndrome, unspecified: Secondary | ICD-10-CM | POA: Diagnosis not present

## 2016-12-08 DIAGNOSIS — M19031 Primary osteoarthritis, right wrist: Secondary | ICD-10-CM | POA: Diagnosis not present

## 2016-12-08 DIAGNOSIS — M19042 Primary osteoarthritis, left hand: Secondary | ICD-10-CM | POA: Diagnosis not present

## 2016-12-08 DIAGNOSIS — R7989 Other specified abnormal findings of blood chemistry: Secondary | ICD-10-CM | POA: Diagnosis not present

## 2016-12-08 DIAGNOSIS — Z87891 Personal history of nicotine dependence: Secondary | ICD-10-CM | POA: Diagnosis not present

## 2016-12-08 DIAGNOSIS — M1990 Unspecified osteoarthritis, unspecified site: Secondary | ICD-10-CM | POA: Diagnosis not present

## 2016-12-08 DIAGNOSIS — M79641 Pain in right hand: Secondary | ICD-10-CM | POA: Diagnosis not present

## 2016-12-08 DIAGNOSIS — M2550 Pain in unspecified joint: Secondary | ICD-10-CM | POA: Diagnosis not present

## 2016-12-21 ENCOUNTER — Other Ambulatory Visit: Payer: Self-pay | Admitting: Nurse Practitioner

## 2016-12-21 ENCOUNTER — Other Ambulatory Visit: Payer: Self-pay | Admitting: Family Medicine

## 2016-12-21 NOTE — Telephone Encounter (Signed)
Ok plus fie monthly ref

## 2016-12-23 ENCOUNTER — Encounter (HOSPITAL_COMMUNITY): Payer: BLUE CROSS/BLUE SHIELD

## 2016-12-23 ENCOUNTER — Encounter (HOSPITAL_COMMUNITY): Payer: Self-pay

## 2016-12-23 ENCOUNTER — Encounter (HOSPITAL_COMMUNITY): Payer: BLUE CROSS/BLUE SHIELD | Attending: Oncology | Admitting: Oncology

## 2016-12-23 DIAGNOSIS — Z87891 Personal history of nicotine dependence: Secondary | ICD-10-CM | POA: Insufficient documentation

## 2016-12-23 DIAGNOSIS — E785 Hyperlipidemia, unspecified: Secondary | ICD-10-CM | POA: Insufficient documentation

## 2016-12-23 DIAGNOSIS — R531 Weakness: Secondary | ICD-10-CM | POA: Insufficient documentation

## 2016-12-23 DIAGNOSIS — E119 Type 2 diabetes mellitus without complications: Secondary | ICD-10-CM | POA: Insufficient documentation

## 2016-12-23 DIAGNOSIS — Z833 Family history of diabetes mellitus: Secondary | ICD-10-CM | POA: Diagnosis not present

## 2016-12-23 DIAGNOSIS — F419 Anxiety disorder, unspecified: Secondary | ICD-10-CM | POA: Insufficient documentation

## 2016-12-23 DIAGNOSIS — Z7984 Long term (current) use of oral hypoglycemic drugs: Secondary | ICD-10-CM | POA: Diagnosis not present

## 2016-12-23 DIAGNOSIS — Z79899 Other long term (current) drug therapy: Secondary | ICD-10-CM | POA: Insufficient documentation

## 2016-12-23 DIAGNOSIS — F329 Major depressive disorder, single episode, unspecified: Secondary | ICD-10-CM | POA: Insufficient documentation

## 2016-12-23 DIAGNOSIS — Z8249 Family history of ischemic heart disease and other diseases of the circulatory system: Secondary | ICD-10-CM | POA: Diagnosis not present

## 2016-12-23 DIAGNOSIS — Z823 Family history of stroke: Secondary | ICD-10-CM | POA: Diagnosis not present

## 2016-12-23 DIAGNOSIS — R0602 Shortness of breath: Secondary | ICD-10-CM | POA: Diagnosis not present

## 2016-12-23 DIAGNOSIS — K7460 Unspecified cirrhosis of liver: Secondary | ICD-10-CM | POA: Insufficient documentation

## 2016-12-23 DIAGNOSIS — Z880 Allergy status to penicillin: Secondary | ICD-10-CM | POA: Insufficient documentation

## 2016-12-23 DIAGNOSIS — M1990 Unspecified osteoarthritis, unspecified site: Secondary | ICD-10-CM | POA: Insufficient documentation

## 2016-12-23 DIAGNOSIS — Z888 Allergy status to other drugs, medicaments and biological substances status: Secondary | ICD-10-CM | POA: Insufficient documentation

## 2016-12-23 DIAGNOSIS — Z9889 Other specified postprocedural states: Secondary | ICD-10-CM | POA: Insufficient documentation

## 2016-12-23 LAB — COMPREHENSIVE METABOLIC PANEL
ALBUMIN: 4 g/dL (ref 3.5–5.0)
ALT: 46 U/L (ref 14–54)
AST: 54 U/L — AB (ref 15–41)
Alkaline Phosphatase: 53 U/L (ref 38–126)
Anion gap: 8 (ref 5–15)
BUN: 12 mg/dL (ref 6–20)
CO2: 30 mmol/L (ref 22–32)
Calcium: 9.1 mg/dL (ref 8.9–10.3)
Chloride: 101 mmol/L (ref 101–111)
Creatinine, Ser: 0.66 mg/dL (ref 0.44–1.00)
GFR calc Af Amer: >60 mL/min (ref >60)
GFR calc non Af Amer: >60 mL/min (ref >60)
GLUCOSE: 151 mg/dL — AB (ref 65–99)
POTASSIUM: 3.8 mmol/L (ref 3.5–5.1)
SODIUM: 139 mmol/L (ref 135–145)
Total Bilirubin: 0.8 mg/dL (ref 0.3–1.2)
Total Protein: 7.5 g/dL (ref 6.5–8.1)

## 2016-12-23 LAB — IRON AND TIBC
Iron: 90 ug/dL (ref 28–170)
SATURATION RATIOS: 26 % (ref 10.4–31.8)
TIBC: 347 ug/dL (ref 250–450)
UIBC: 257 ug/dL

## 2016-12-23 LAB — CBC WITH DIFFERENTIAL/PLATELET
BASOS PCT: 1 %
Basophils Absolute: 0.1 10*3/uL (ref 0.0–0.1)
EOS ABS: 0.6 10*3/uL (ref 0.0–0.7)
Eosinophils Relative: 9 %
HCT: 40.3 % (ref 36.0–46.0)
HEMOGLOBIN: 14.3 g/dL (ref 12.0–15.0)
Lymphocytes Relative: 27 %
Lymphs Abs: 1.8 10*3/uL (ref 0.7–4.0)
MCH: 30.8 pg (ref 26.0–34.0)
MCHC: 35.5 g/dL (ref 30.0–36.0)
MCV: 86.9 fL (ref 78.0–100.0)
MONOS PCT: 6 %
Monocytes Absolute: 0.4 10*3/uL (ref 0.1–1.0)
NEUTROS ABS: 3.7 10*3/uL (ref 1.7–7.7)
NEUTROS PCT: 57 %
Platelets: 219 10*3/uL (ref 150–400)
RBC: 4.64 MIL/uL (ref 3.87–5.11)
RDW: 12.5 % (ref 11.5–15.5)
WBC: 6.4 10*3/uL (ref 4.0–10.5)

## 2016-12-23 LAB — FERRITIN: FERRITIN: 157 ng/mL (ref 11–307)

## 2016-12-23 NOTE — Patient Instructions (Signed)
[place] at [facility] Discharge Instructions  RECOMMENDATIONS MADE BY THE CONSULTANT AND ANY TEST RESULTS WILL BE SENT TO YOUR REFERRING PHYSICIAN.  You were seen today by Dr. Twana First You will have lab work today and we may call you some of the results but you will need to  Follow up in 2 weeks to get all the results as some tests are send out and may take longer to result See Amy up front for appointments   Thank you for choosing [place] at [facility] to provide your oncology and hematology care.  To afford each patient quality time with our provider, please arrive at least 15 minutes before your scheduled appointment time.    If you have a lab appointment with the [facility] please come in thru the  Main Entrance and check in at the main information desk  You need to re-schedule your appointment should you arrive 10 or more minutes late.  We strive to give you quality time with our providers, and arriving late affects you and other patients whose appointments are after yours.  Also, if you no show three or more times for appointments you may be dismissed from the clinic at the providers discretion.     Again, thank you for choosing [facility].  Our hope is that these requests will decrease the amount of time that you wait before being seen by our physicians.       _____________________________________________________________  Should you have questions after your visit to [facility], please contact our office at [phone] between the hours of 8:30 a.m. and 4:30 p.m.  Voicemails left after 4:30 p.m. will not be returned until the following business day.  For prescription refill requests, have your pharmacy contact our office.       Resources For Cancer Patients and their Caregivers ? American Cancer Society: Can assist with transportation, wigs, general needs, runs Look Good Feel Better.         7632264370 ? Cancer Care: Provides financial assistance, online support groups, medication/co-pay assistance.  1-800-813-HOPE (640)789-2167) ? [facility] Assists [place] cancer patients and their families through emotional , educational and financial support.  (231) 586-2253 ? [place] DSS Where to apply for food stamps, Medicaid and utility assistance. 380-603-0213 ? [employer]: Transportation to medical appointments. (850) 030-5941 ? Social Security Administration: May apply for disability if have a Stage IV cancer. 401-397-7950 430-062-2173 ? [employer], Disability and Transit Services: Assists with nutrition, care and transit needs. [place] Support Programs: @10RELATIVEDAYS @ > Cancer Support Group  2nd Tuesday of the month 1pm-2pm, Journey Room  > Creative Journey  3rd Tuesday of the month 1130am-1pm, Journey Room  > Look Good Feel Better  1st Wednesday of the month 10am-12 noon, Journey Room (Call [place] to register [phone])

## 2016-12-23 NOTE — Progress Notes (Signed)
[place]  CONSULT NOTE  Patient Care Team: Mikey Kirschner, MD as PCP - General (Family Medicine)  CHIEF COMPLAINTS/PURPOSE OF CONSULTATION:  Hemochromatosis   No history exists.    HISTORY OF PRESENTING ILLNESS:  Catherine Moore 61 y.o. female is here because of hemochromatosis. On 08/03/2016, her ferritin was elevated at 702.  She was diagnosed last year with hemochromatosis but states that she can't remember being tested for the gene mutation. Denies family history of hemochromatosis. She has a history of cirrhosis of the liver, NASH. She has never had a phlebotomy. Pt has some SOB and weakness in her hands from OA. Denies chest pain, abdominal pain, bowel problems, blurry vision, or any other concerns.   MEDICAL HISTORY:  Past Medical History:  Diagnosis Date  . Anxiety   . Arthritis    Rhematoid and Osteoarthritis  . Cirrhosis ([place])   . Complication of anesthesia   . Depression   . Diabetes [facility])    Patient states borderline. No longer takes Metformin.  Marland Kitchen Hyperlipidemia   . PONV (postoperative nausea and vomiting)     SURGICAL HISTORY: Past Surgical History:  Procedure Laterality Date  . BIOPSY  08/16/2016   Procedure: BIOPSY;  Surgeon: Daneil Dolin, MD;  [facility];  Service: Endoscopy;;  . COLONOSCOPY  08/09/2010   [id number] rectum colon  . ENDOMETRIAL ABLATION    . ESOPHAGOGASTRODUODENOSCOPY N/A 08/16/2016   Procedure: ESOPHAGOGASTRODUODENOSCOPY (EGD);  Surgeon: Daneil Dolin, MD;  [facility];  Service: Endoscopy;  Laterality: N/A;  7:30 am  . IR GENERIC HISTORICAL  06/13/2016   IR TRANSCATHETER BX 06/13/2016 [facility]  . IR GENERIC HISTORICAL  06/13/2016   IR US GUIDE VASC ACCESS RIGHT 06/13/2016 [facility]  . IR GENERIC HISTORICAL  06/13/2016   IR VENOGRAM HEPATIC W HEMODYNAMIC EVALUATION 06/13/2016 [facility]    SOCIAL HISTORY: Social History   Social History  . Marital status: Married    Spouse  name: N/A  . Number of children: N/A  . Years of education: N/A   Occupational History  . Not on file.   Social History Main Topics  . Smoking status: Former Smoker    Packs/day: 1.50    Years: 20.00    Types: Cigarettes    Start date: 05/23/1971    Quit date: 06/16/1991  . Smokeless tobacco: Never Used  . Alcohol use No  . Drug use: No  . Sexual activity: Yes    Birth control/ protection: Post-menopausal, Surgical   Other Topics Concern  . Not on file   Social History Narrative  . No narrative on file    FAMILY HISTORY: Family History  Problem Relation Age of Onset  . Diabetes Father   . Hypertension Father   . Heart attack Father 36    MI  . Stroke Father   . Osteoporosis Mother   . Arthritis Sister   . Arthritis Brother   . Colon cancer Neg Hx   . Liver disease Neg Hx     ALLERGIES:  is allergic to zocor [simvastatin] and penicillins.  MEDICATIONS:  Current Outpatient Prescriptions  Medication Sig Dispense Refill  . ALPRAZolam (XANAX) 1 MG tablet TAKE 1/2 TO 1 TABLET BY MOUTH TWICE DAILY AS NEEDED 60 tablet 5  . cetirizine (ZYRTEC) 10 MG tablet TAKE (1) TABLET BY MOUTH ONCE DAILY. 30 tablet 5  . DULoxetine (CYMBALTA) 60 MG capsule TAKE 1 CAPSULE BY MOUTH EVERY DAY 30 capsule  5  . fluticasone (FLONASE) 50 MCG/ACT nasal spray Place 2 sprays into both nostrils daily. 16 g 11  . glipiZIDE (GLUCOTROL) 10 MG tablet Take 10 mg by mouth.    Marland Kitchen HYDROcodone-acetaminophen (NORCO) 10-325 MG tablet Take 1 tablet by mouth every 6 (six) hours as needed for severe pain. 90 tablet 0  . metFORMIN (GLUCOPHAGE) 500 MG tablet Take 1 tablet (500 mg total) by mouth 2 (two) times daily with a meal. 60 tablet 5  . omeprazole (PRILOSEC) 20 MG capsule TAKE 1 CAPSULE BY MOUTH EVERY DAY 30 capsule 5  . rosuvastatin (CRESTOR) 10 MG tablet TAKE 1 TABLET BY MOUTH DAILY 30 tablet 5   No current facility-administered medications for this visit.    Review of Systems  HENT: Negative.   Eyes:  Negative.  Negative for blurred vision.  Respiratory: Positive for shortness of breath.   Cardiovascular: Negative.  Negative for chest pain.  Gastrointestinal: Negative.  Negative for abdominal pain, blood in stool, constipation, diarrhea and melena.  Genitourinary: Negative.   Musculoskeletal: Negative.   Skin: Negative.   Neurological: Positive for weakness (hands from OA).  Endo/Heme/Allergies: Negative.   Psychiatric/Behavioral: Negative.   All other systems reviewed and are negative. 14 point ROS was done and is otherwise as detailed above or in HPI  PHYSICAL EXAMINATION: ECOG PERFORMANCE STATUS: 0 - Asymptomatic  Vitals:   12/23/16 0835  BP: 140/65  Pulse: 79  Resp: 16  Temp: 98.4 F (36.9 C)   Filed Weights   12/23/16 0835  Weight: 183 lb 9.6 oz (83.3 kg)   Physical Exam  Constitutional: She is oriented to person, place, and time and well-developed, well-nourished, and in no distress.  HENT:  Head: Normocephalic and atraumatic.  Eyes: EOM are normal. Pupils are equal, round, and reactive to light.  Neck: Normal range of motion. Neck supple.  Cardiovascular: Normal rate, regular rhythm and normal heart sounds.   Pulmonary/Chest: Effort normal and breath sounds normal.  Abdominal: Soft. Bowel sounds are normal.  Musculoskeletal: Normal range of motion.  Neurological: She is alert and oriented to person, place, and time. Gait normal.  Skin: Skin is warm and dry.  Nursing note and vitals reviewed.  LABORATORY DATA:  I have reviewed the data as listed Lab Results  Component Value Date   WBC 4.6 06/13/2016   HGB 14.1 06/13/2016   HCT 40.5 06/13/2016   MCV 84.9 06/13/2016   PLT 177 06/13/2016   CMP     Component Value Date/Time   NA 138 09/07/2016 0918   K 4.6 09/07/2016 0918   CL 95 (L) 09/07/2016 0918   CO2 28 09/07/2016 0918   GLUCOSE 324 (H) 09/07/2016 0918   GLUCOSE 282 (H) 06/13/2016 1043   BUN 8 09/07/2016 0918   CREATININE 0.82 09/07/2016 0918    CREATININE 0.81 07/11/2014 0739   CALCIUM 9.6 09/07/2016 0918   PROT 7.2 09/07/2016 0918   ALBUMIN 4.3 09/07/2016 0918   AST 88 (H) 09/07/2016 0918   ALT 106 (H) 09/07/2016 0918   ALKPHOS 86 09/07/2016 0918   BILITOT 0.8 09/07/2016 0918   GFRNONAA 78 09/07/2016 0918   GFRAA 90 09/07/2016 0918      RADIOGRAPHIC STUDIES: I have personally reviewed the radiological images as listed and agreed with the findings in the report. No results found.  ASSESSMENT & PLAN:  Elevated ferritin level r/o hemochromatosis   The majority of patients found to have homozygous hereditary hemochromatosis (HH) during population or family screening studies  are likely to be asymptomatic and without evidence for significant end-organ damage.  Individuals with asymptomatic HH and ferritins <500 are at low risk for developing HH-related signs and symptoms in the future. Such patients need only yearly examination along with determination of the serum iron, ferritin, and transferrin saturation. Symptomatic patients and/or those with end-organ damage (ie, liver, endocrine organs, heart) require treatment to remove excess iron stores. Such treatment may improve diabetes, hepatic fibrosis, varices, cardiac function, secondary hypogonadism, and prevent the development of hepatocellular carcinoma Initial treatment for patients with symptomatic HH would be to initiate immediate phlebotomy weekly or every other week. Phlebotomy should continue until the patient shows evidence for a reduction in iron stores, as evidenced by a ferritin concentration in the range of 50 to 100 ng/mL, this is achieved in most patients via a 500 mL phlebotomy every two to four months. Chelation therapy to remove accumulated iron is indicated only if the patient is unable to tolerate phlebotomy therapy.  I will check a hemochromatosis DNA PCR for confirmation of hereditary hemochromatosis. Check CBC, CMP, iron studies.   She will return for follow  up in 2 weeks to review labs and discuss the next plan of care.     MEDICATIONS PRESCRIBED THIS ENCOUNTER: Meds ordered this encounter  Medications  . glipiZIDE (GLUCOTROL) 10 MG tablet    Sig: Take 10 mg by mouth.   All questions were answered. The patient knows to call the clinic with any problems, questions or concerns.  This document serves as a record of services personally performed by Twana First, MD. It was created on her behalf by Martinique Casey, a trained medical scribe. The creation of this record is based on the scribe's personal observations and the provider's statements to them. This document has been checked and approved by the attending provider.  I have reviewed the above documentation for accuracy and completeness and I agree with the above.  This note was electronically signed.    Martinique M Casey  12/23/2016 8:41 AM

## 2016-12-28 LAB — HEMOCHROMATOSIS DNA-PCR(C282Y,H63D)

## 2016-12-30 ENCOUNTER — Ambulatory Visit: Payer: BLUE CROSS/BLUE SHIELD | Admitting: Nurse Practitioner

## 2017-01-04 ENCOUNTER — Ambulatory Visit (INDEPENDENT_AMBULATORY_CARE_PROVIDER_SITE_OTHER): Payer: BLUE CROSS/BLUE SHIELD | Admitting: Nurse Practitioner

## 2017-01-04 ENCOUNTER — Encounter: Payer: Self-pay | Admitting: Nurse Practitioner

## 2017-01-04 VITALS — BP 132/82 | Ht 66.0 in | Wt 185.2 lb

## 2017-01-04 DIAGNOSIS — M2550 Pain in unspecified joint: Secondary | ICD-10-CM | POA: Diagnosis not present

## 2017-01-04 DIAGNOSIS — E119 Type 2 diabetes mellitus without complications: Secondary | ICD-10-CM | POA: Diagnosis not present

## 2017-01-04 DIAGNOSIS — E785 Hyperlipidemia, unspecified: Secondary | ICD-10-CM | POA: Diagnosis not present

## 2017-01-04 DIAGNOSIS — Z79891 Long term (current) use of opiate analgesic: Secondary | ICD-10-CM | POA: Diagnosis not present

## 2017-01-04 MED ORDER — HYDROCODONE-ACETAMINOPHEN 10-325 MG PO TABS
1.0000 | ORAL_TABLET | Freq: Four times a day (QID) | ORAL | 0 refills | Status: DC | PRN
Start: 2017-01-04 — End: 2017-01-04

## 2017-01-04 MED ORDER — HYDROCODONE-ACETAMINOPHEN 10-325 MG PO TABS: 1.0000 | ORAL_TABLET | Freq: Four times a day (QID) | ORAL | 0 refills | Status: DC | PRN

## 2017-01-04 NOTE — Progress Notes (Signed)
Subjective: This patient was seen today for chronic pain  The medication list was reviewed and updated.   -Compliance with medication: yes  - Number patient states they take daily: 3  -when was the last dose patient took? Last night  The patient was advised the importance of maintaining medication and not using illegal substances with these.  Refills needed: yes  The patient was educated that we can provide 3 monthly scripts for their medication, it is their responsibility to follow the instructions.  Side effects or complications from medications: none  Patient is aware that pain medications are meant to minimize the severity of the pain to allow their pain levels to improve to allow for better function. They are aware of that pain medications cannot totally remove their pain.  Pain level is 6-7/10 at its worst, 2/10 after taking pain medication. This allows her to continue working and perform ADLs.  Due for UDT ( at least once per year) : UTD NCCSR reviewed.   Objective: NAD. Alert, oriented. Lungs clear. Heart regular rate rhythm.  Assessment:  Problem List Items Addressed This Visit      Endocrine   Type 2 diabetes mellitus not at goal [facility] [place]) - Primary   Relevant Orders   HgB A1c     Other   Arthralgia   Encounter for long-term opiate analgesic use   Hyperlipemia   Relevant Orders   Lipid panel     Patient is also due for some lab work which was ordered today. Continue activity as tolerated. Has seen some generalized improvement of pain since her iron levels have greatly improved. Other labs are pending. Return in about 3 months (around 04/06/2017) for pain management.

## 2017-01-05 LAB — LIPID PANEL
Chol/HDL Ratio: 3.6 ratio units (ref 0.0–4.4)
Cholesterol, Total: 177 mg/dL (ref 100–199)
HDL: 49 mg/dL (ref >39)
LDL CALC: 100 mg/dL — AB (ref 0–99)
Triglycerides: 138 mg/dL (ref 0–149)
VLDL Cholesterol Cal: 28 mg/dL (ref 5–40)

## 2017-01-05 LAB — HEMOGLOBIN A1C
ESTIMATED AVERAGE GLUCOSE: 131 mg/dL
Hgb A1c MFr Bld: 6.2 % — ABNORMAL HIGH (ref 4.8–5.6)

## 2017-01-10 ENCOUNTER — Ambulatory Visit (HOSPITAL_COMMUNITY): Payer: BLUE CROSS/BLUE SHIELD

## 2017-01-19 ENCOUNTER — Telehealth: Payer: Self-pay | Admitting: Internal Medicine

## 2017-01-19 NOTE — Telephone Encounter (Signed)
RECALL FOR ULTRASOUND 

## 2017-01-19 NOTE — Telephone Encounter (Signed)
Letter mailed

## 2017-02-01 ENCOUNTER — Ambulatory Visit: Payer: BLUE CROSS/BLUE SHIELD | Admitting: Gastroenterology

## 2017-02-24 ENCOUNTER — Ambulatory Visit (HOSPITAL_COMMUNITY): Payer: BLUE CROSS/BLUE SHIELD

## 2017-02-27 ENCOUNTER — Other Ambulatory Visit: Payer: Self-pay | Admitting: Nurse Practitioner

## 2017-02-27 MED ORDER — NITROFURANTOIN MONOHYD MACRO 100 MG PO CAPS: 100.0000 mg | ORAL_CAPSULE | Freq: Two times a day (BID) | ORAL | 0 refills | Status: DC

## 2017-02-27 NOTE — Progress Notes (Signed)
Phone note from patient: c/o dysuria that began 3 days ago. Urgency and frequency. No fever back or pelvic pain. Minimal relief with AZO. Requesting antibiotic. Will send an antibiotic. Office visit by the end of the week if no improvement, sooner if worse. Warning signs reviewed.

## 2017-03-01 ENCOUNTER — Other Ambulatory Visit: Payer: Self-pay

## 2017-03-01 ENCOUNTER — Ambulatory Visit (INDEPENDENT_AMBULATORY_CARE_PROVIDER_SITE_OTHER): Payer: BLUE CROSS/BLUE SHIELD | Admitting: Gastroenterology

## 2017-03-01 ENCOUNTER — Encounter: Payer: Self-pay | Admitting: Gastroenterology

## 2017-03-01 DIAGNOSIS — K7581 Nonalcoholic steatohepatitis (NASH): Secondary | ICD-10-CM

## 2017-03-01 NOTE — Progress Notes (Signed)
Referring Provider: Mikey Kirschner, MD Primary Care Physician:  Mikey Kirschner, MD Primary GI: Dr. Gala Romney   Chief Complaint  Patient presents with  . elevated LFT'S    HPI:   Catherine Moore is a 62 y.o. female presenting today with a history of biopsy-proven NASH and wedge pressures consistent with portal hypertension. elastography with Metavir F3/F4, early cirrhosis on biopsy. Negative Hep C antibody,  ANA negative, Autoimmune, PBC, celiac serologies, Hep B negative. TSH normal. Iron saturation right at 44. 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 Ferritin elevated historically and referred to Hematology for further evaluation. Up-to-date on EGD with next screening in 2019.   She has lost weight purposefully, and her last LFTs are significantly improved from prior measurements. Ferritin is actually 157, iron 90, sats 26, all improved. Hgb 14.3. Cholesterol and A1c all drastically improved. She feels much better.    Past Medical History:  Diagnosis Date  . Anxiety   . Arthritis    Rhematoid and Osteoarthritis  . Cirrhosis ([place])   . Complication of anesthesia   . Depression   . Diabetes ([place])   . Hyperlipidemia   . PONV (postoperative nausea and vomiting)     Past Surgical History:  Procedure Laterality Date  . BIOPSY  08/16/2016   Procedure: BIOPSY;  Surgeon: Daneil Dolin, MD;  [facility];  Service: Endoscopy;;  . COLONOSCOPY  08/09/2010   [id number] rectum colon  . ENDOMETRIAL ABLATION    . ESOPHAGOGASTRODUODENOSCOPY N/A 08/16/2016   Dr. Gala Romney: normal esophagus, GAVE, multiple gastric polyps s/p biopsy (hyperplastic), normal duodenum   . IR GENERIC HISTORICAL  06/13/2016   IR TRANSCATHETER BX 06/13/2016 [facility]  . IR GENERIC HISTORICAL  06/13/2016   IR US GUIDE VASC ACCESS RIGHT 06/13/2016 [facility]  . IR GENERIC HISTORICAL  06/13/2016   IR VENOGRAM HEPATIC W HEMODYNAMIC EVALUATION 06/13/2016 [facility]    Current Outpatient  Prescriptions  Medication Sig Dispense Refill  . ALPRAZolam (XANAX) 1 MG tablet TAKE 1/2 TO 1 TABLET BY MOUTH TWICE DAILY AS NEEDED 60 tablet 5  . cetirizine (ZYRTEC) 10 MG tablet TAKE (1) TABLET BY MOUTH ONCE DAILY. 30 tablet 5  . DULoxetine (CYMBALTA) 60 MG capsule TAKE 1 CAPSULE BY MOUTH EVERY DAY 30 capsule 5  . fluticasone (FLONASE) 50 MCG/ACT nasal spray Place 2 sprays into both nostrils daily. 16 g 11  . glipiZIDE (GLUCOTROL) 10 MG tablet Take 10 mg by mouth.    Marland Kitchen HYDROcodone-acetaminophen (NORCO) 10-325 MG tablet Take 1 tablet by mouth every 6 (six) hours as needed for severe pain. 90 tablet 0  . metFORMIN (GLUCOPHAGE) 500 MG tablet Take 1 tablet (500 mg total) by mouth 2 (two) times daily with a meal. 60 tablet 5  . nitrofurantoin, macrocrystal-monohydrate, (MACROBID) 100 MG capsule Take 1 capsule (100 mg total) by mouth 2 (two) times daily. 14 capsule 0  . omeprazole (PRILOSEC) 20 MG capsule TAKE 1 CAPSULE BY MOUTH EVERY DAY 30 capsule 5  . rosuvastatin (CRESTOR) 10 MG tablet TAKE 1 TABLET BY MOUTH DAILY 30 tablet 5  . vitamin B-12 (CYANOCOBALAMIN) 1000 MCG tablet Take 1,000 mcg by mouth daily.     No current facility-administered medications for this visit.     Allergies as of 03/01/2017 - Review Complete 03/01/2017  Allergen Reaction Noted  . Zocor [simvastatin] Nausea Only 02/01/2013  . Penicillins Swelling and Rash 07/19/2010    Family History  Problem Relation Age of Onset  .  Diabetes Father   . Hypertension Father   . Heart attack Father 73       MI  . Stroke Father   . Osteoporosis Mother   . Arthritis Sister   . Arthritis Brother   . Colon cancer Neg Hx   . Liver disease Neg Hx     Social History   Social History  . Marital status: Married    Spouse name: N/A  . Number of children: N/A  . Years of education: N/A   Social History Main Topics  . Smoking status: Former Smoker    Packs/day: 1.50    Years: 20.00    Types: Cigarettes    Start date:  05/23/1971    Quit date: 06/16/1991  . Smokeless tobacco: Never Used  . Alcohol use No  . Drug use: No  . Sexual activity: Yes    Birth control/ protection: Post-menopausal, Surgical   Other Topics Concern  . None   Social History Narrative  . None    Review of Systems: As mentioned in HPI   Physical Exam: BP 129/72   Pulse (!) 102   Temp 98.3 F (36.8 C) (Oral)   Ht 5' 6"  (1.676 m)   Wt 188 lb (85.3 kg)   BMI 30.34 kg/m  General:   Alert and oriented. No distress noted. Pleasant and cooperative.  Head:  Normocephalic and atraumatic. Eyes:  Conjuctiva clear without scleral icterus. Mouth:  Oral mucosa pink and moist. Good dentition. No lesions. Heart:  S1, S2 present without murmurs, rubs, or gallops. Regular rate and rhythm. Abdomen:  +BS, soft, non-tender and non-distended. No rebound or guarding. No HSM or masses noted. Msk:  Symmetrical without gross deformities. Normal posture. Extremities:  Without edema. Neurologic:  Alert and  oriented x4;  grossly normal neurologically. Psych:  Alert and cooperative. Normal mood and affect.  Lab Results  Component Value Date   ALT 46 12/23/2016   AST 54 (H) 12/23/2016   ALKPHOS 53 12/23/2016   BILITOT 0.8 12/23/2016   Lab Results  Component Value Date   WBC 6.4 12/23/2016   HGB 14.3 12/23/2016   HCT 40.3 12/23/2016   MCV 86.9 12/23/2016   PLT 219 12/23/2016   Lab Results  Component Value Date   CREATININE 0.66 12/23/2016   BUN 12 12/23/2016   NA 139 12/23/2016   K 3.8 12/23/2016   CL 101 12/23/2016   CO2 30 12/23/2016   Lab Results  Component Value Date   IRON 90 12/23/2016   TIBC 347 12/23/2016   FERRITIN 157 12/23/2016

## 2017-03-01 NOTE — Patient Instructions (Signed)
Keep up the great work! You are doing so wonderful!  I have ordered a routine ultrasound.  Please complete the stool test at home and return to our office.  I will see you in 6 months!

## 2017-03-03 NOTE — Assessment & Plan Note (Signed)
61 year old female with biopsy-proven NASH and portal hypertension, early cirrhosis, elastography with Metavir F3/F4, with need for next EGD in 2019. She has purposefully lost weight with excellent improvement in multiple measurements to include LFT, lipid panel, and ferritin normalizing. She has seen hematology due to elevated ferritin, which was felt to be more of a bystander reaction in the setting of NASH. Encouraged to keep follow-up appt with Hematology. US abdomen now for hepatoma screening and return in 6 months. We will check ifobt now for routine purposes, as last colonoscopy was in 2011.

## 2017-03-06 NOTE — Progress Notes (Signed)
cc'ed to pcp °

## 2017-03-07 ENCOUNTER — Ambulatory Visit (HOSPITAL_COMMUNITY)
Admission: RE | Admit: 2017-03-07 | Discharge: 2017-03-07 | Disposition: A | Discharge status: Home / Self Care | Payer: BLUE CROSS/BLUE SHIELD | Source: Ambulatory Visit | Attending: Gastroenterology | Admitting: Gastroenterology

## 2017-03-07 DIAGNOSIS — K8020 Calculus of gallbladder without cholecystitis without obstruction: Secondary | ICD-10-CM | POA: Diagnosis not present

## 2017-03-07 DIAGNOSIS — K7581 Nonalcoholic steatohepatitis (NASH): Secondary | ICD-10-CM | POA: Diagnosis not present

## 2017-03-07 DIAGNOSIS — N210 Calculus in bladder: Secondary | ICD-10-CM | POA: Diagnosis not present

## 2017-03-09 NOTE — Progress Notes (Signed)
Fatty liver, no HCC. Repeat in 6 months.

## 2017-03-10 NOTE — Progress Notes (Signed)
ON RECALL  °

## 2017-03-15 ENCOUNTER — Other Ambulatory Visit: Payer: Self-pay | Admitting: Family Medicine

## 2017-03-15 ENCOUNTER — Other Ambulatory Visit: Payer: Self-pay | Admitting: Nurse Practitioner

## 2017-04-05 ENCOUNTER — Ambulatory Visit: Payer: Self-pay | Admitting: Nurse Practitioner

## 2017-04-13 ENCOUNTER — Other Ambulatory Visit: Payer: Self-pay | Admitting: Nurse Practitioner

## 2017-04-14 ENCOUNTER — Ambulatory Visit: Payer: Self-pay | Admitting: Nurse Practitioner

## 2017-04-18 ENCOUNTER — Encounter: Payer: Self-pay | Admitting: Family Medicine

## 2017-04-18 ENCOUNTER — Ambulatory Visit (INDEPENDENT_AMBULATORY_CARE_PROVIDER_SITE_OTHER): Payer: Self-pay | Admitting: Nurse Practitioner

## 2017-04-18 ENCOUNTER — Encounter: Payer: Self-pay | Admitting: Nurse Practitioner

## 2017-04-18 VITALS — BP 118/76 | Ht 66.0 in | Wt 194.0 lb

## 2017-04-18 DIAGNOSIS — E119 Type 2 diabetes mellitus without complications: Secondary | ICD-10-CM

## 2017-04-18 DIAGNOSIS — E785 Hyperlipidemia, unspecified: Secondary | ICD-10-CM

## 2017-04-18 DIAGNOSIS — R748 Abnormal levels of other serum enzymes: Secondary | ICD-10-CM

## 2017-04-18 DIAGNOSIS — Z79899 Other long term (current) drug therapy: Secondary | ICD-10-CM

## 2017-04-18 LAB — POCT GLYCOSYLATED HEMOGLOBIN (HGB A1C): Hemoglobin A1C: 5.7

## 2017-04-18 MED ORDER — MELOXICAM 15 MG PO TABS: 15.0000 mg | ORAL_TABLET | Freq: Every day | ORAL | 2 refills | Status: DC

## 2017-04-18 MED ORDER — HYDROCODONE-ACETAMINOPHEN 10-325 MG PO TABS: 1.0000 | ORAL_TABLET | Freq: Four times a day (QID) | ORAL | 0 refills | Status: DC | PRN

## 2017-04-18 MED ORDER — HYDROCHLOROTHIAZIDE 25 MG PO TABS: 25.0000 mg | ORAL_TABLET | Freq: Every day | ORAL | 2 refills | Status: DC

## 2017-04-18 NOTE — Patient Instructions (Signed)
Glucosamine Organic honey

## 2017-04-18 NOTE — Progress Notes (Addendum)
Subjective: This patient was seen today for chronic pain  The medication list was reviewed and updated.   -Compliance with medication: yes  - Number patient states they take daily: 3 times per day  -when was the last dose patient took? This am  The patient was advised the importance of maintaining medication and not using illegal substances with these.  Refills needed: yes  The patient was educated that we can provide 3 monthly scripts for their medication, it is their responsibility to follow the instructions.  Side effects or complications from medications: none  Patient is aware that pain medications are meant to minimize the severity of the pain to allow their pain levels to improve to allow for better function. They are aware of that pain medications cannot totally remove their pain.  Due for UDT ( at least once per year) : next visit Having intense muscle and joint pain today; one day last week was bad enough that she missed work which is unusual. Also complaints of swelling especially in the hands and feet. Works at [employer] job.  [place] reviewed.  Objective: NAD. Alert, oriented. Fatigued in appearance. Lungs clear. Heart regular rate rhythm. Mild generalized edema of both hands. Continues follow-up with her specialists. Results for orders placed or performed in visit on 04/18/17  POCT glycosylated hemoglobin (Hb A1C)  Result Value Ref Range   Hemoglobin A1C 5.7     Assessment:  Problem List Items Addressed This Visit      Other   Elevated liver enzymes   Relevant Orders   Magnesium   Hyperlipemia   Relevant Medications   hydrochlorothiazide (HYDRODIURIL) 25 MG tablet   Other Relevant Orders   Magnesium    Other Visit Diagnoses    Diabetes mellitus without complication ([place])    -  Primary   Relevant Orders   POCT glycosylated hemoglobin (Hb A1C) (Completed)   Magnesium   High risk medication use       Relevant Orders   Basic metabolic panel   Hepatic function  panel   Magnesium     Plan:  Meds ordered this encounter  Medications  . hydrochlorothiazide (HYDRODIURIL) 25 MG tablet    Sig: Take 1 tablet (25 mg total) by mouth daily. Prn swelling    Dispense:  30 tablet    Refill:  2    Order Specific Question:   Supervising Provider    Answer:   Mikey Kirschner [[id number]]  . meloxicam (MOBIC) 15 MG tablet    Sig: Take 1 tablet (15 mg total) by mouth daily. Prn pain    Dispense:  30 tablet    Refill:  2    Order Specific Question:   Supervising Provider    Answer:   Mikey Kirschner [[id number]]  . DISCONTD: HYDROcodone-acetaminophen (NORCO) 10-325 MG tablet    Sig: Take 1 tablet by mouth every 6 (six) hours as needed for severe pain.    Dispense:  90 tablet    Refill:  0    May fill 60 days from 04/18/17    Order Specific Question:   Supervising Provider    Answer:   Mikey Kirschner [[id number]]  . DISCONTD: HYDROcodone-acetaminophen (NORCO) 10-325 MG tablet    Sig: Take 1 tablet by mouth every 6 (six) hours as needed for severe pain.    Dispense:  90 tablet    Refill:  0    May fill 30 days from 04/18/17    Order Specific Question:  Supervising Provider    Answer:   Mikey Kirschner [[id number]]  . HYDROcodone-acetaminophen (NORCO) 10-325 MG tablet    Sig: Take 1 tablet by mouth every 6 (six) hours as needed for severe pain.    Dispense:  90 tablet    Refill:  0    Order Specific Question:   Supervising Provider    Answer:   Mikey Kirschner [[id number]]   Diabetes is under good control. Add mobic to her regimen if no problems with acid reflux or heartburn. Add HCTZ as needed for swelling. Continue pain medication at same dose. Note patient has had chronic myalgias and joint pain well before she started Crestor. Return in about 3 months (around 07/19/2017) for recheck. Other lab work to be done at that time.

## 2017-06-06 IMAGING — US US ABDOMEN COMPLETE W/ ELASTOGRAPHY
1 series · 12 of 25 positions shown · non-contrast
Comparison: 05/25/2007 abdominal sonogram.

CLINICAL DATA: Elevated liver function tests.



[Series 1: us abdomen complete w/ elastography · 0.16mm/px · 12 of 97 slices shown]
[im 5/97]
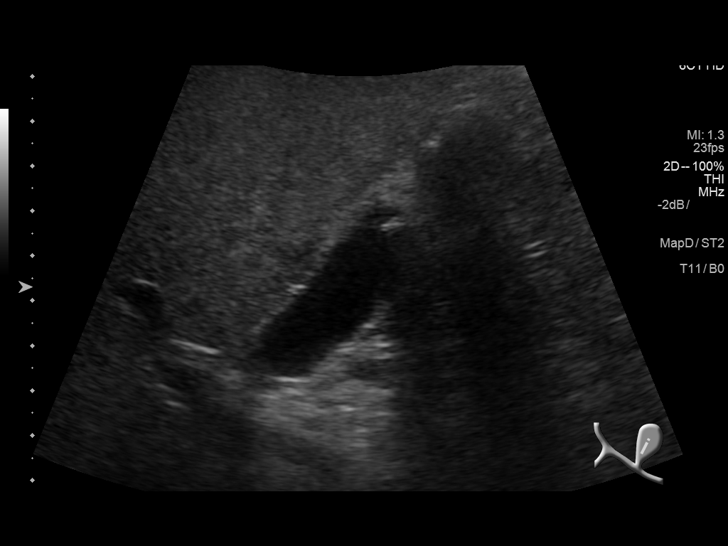
[im 13/97]
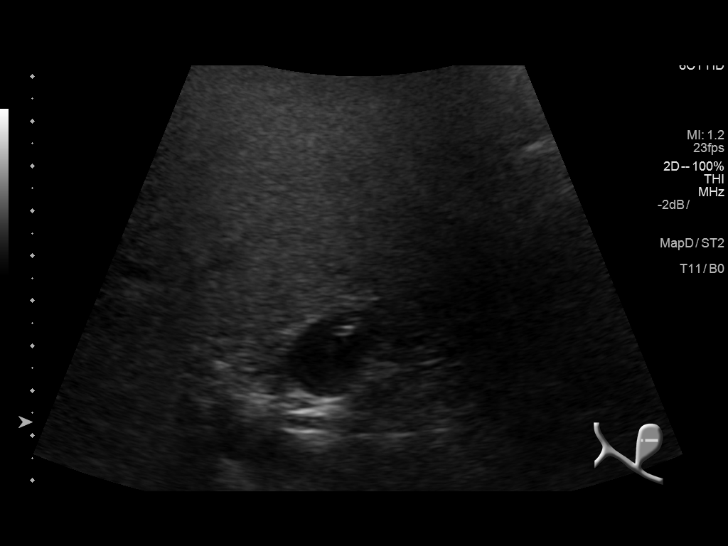
[im 21/97]
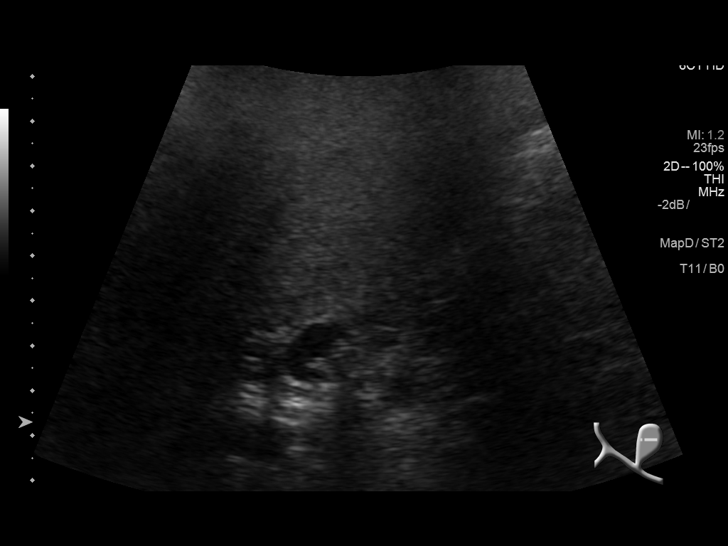
[im 29/97]
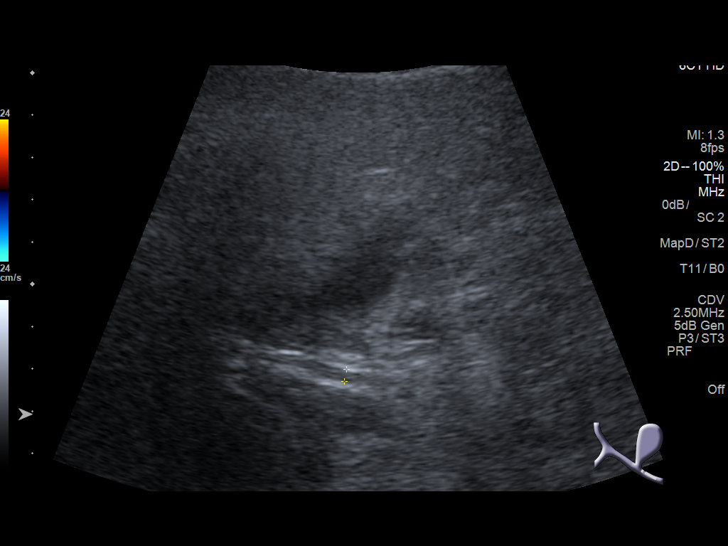
[im 37/97]
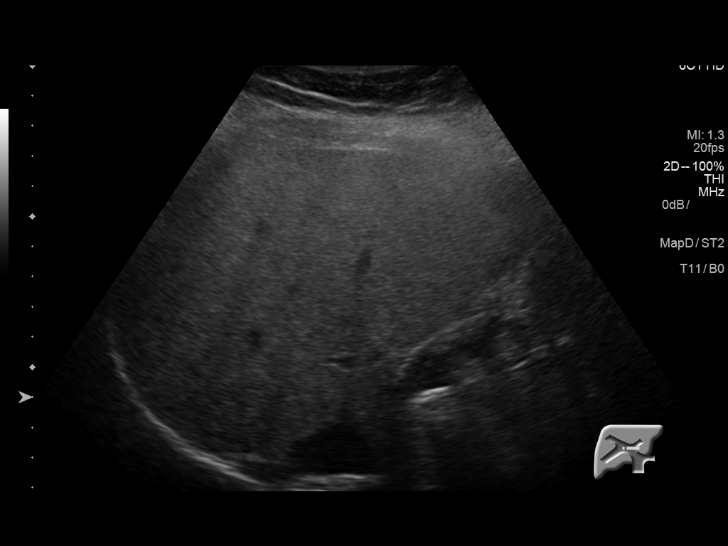
[im 45/97]
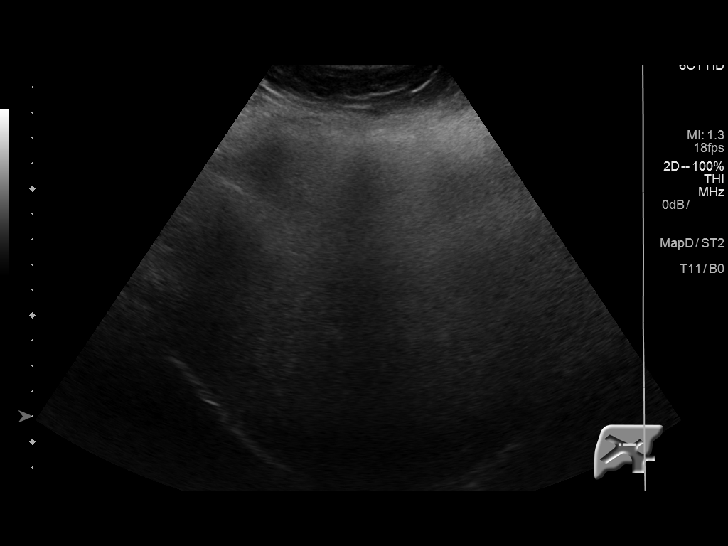
[im 53/97]
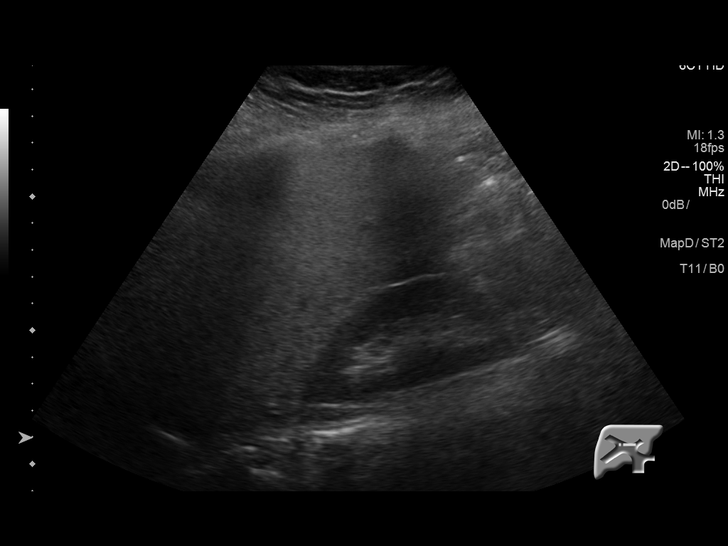
[im 61/97]
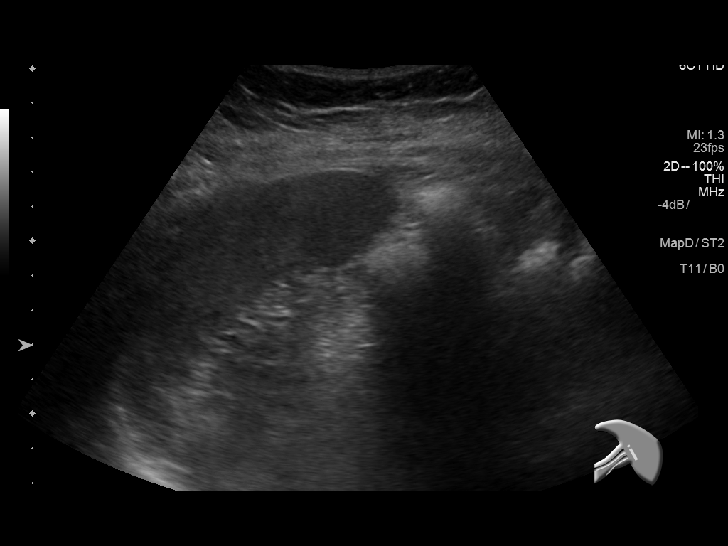
[im 69/97]
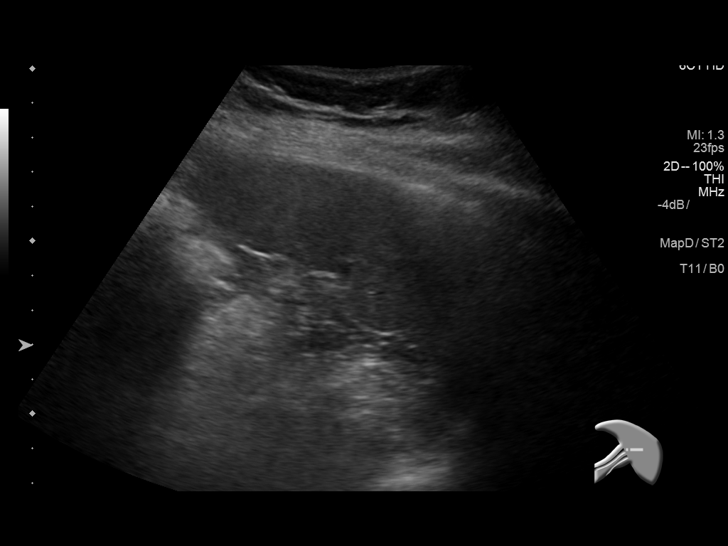
[im 77/97]
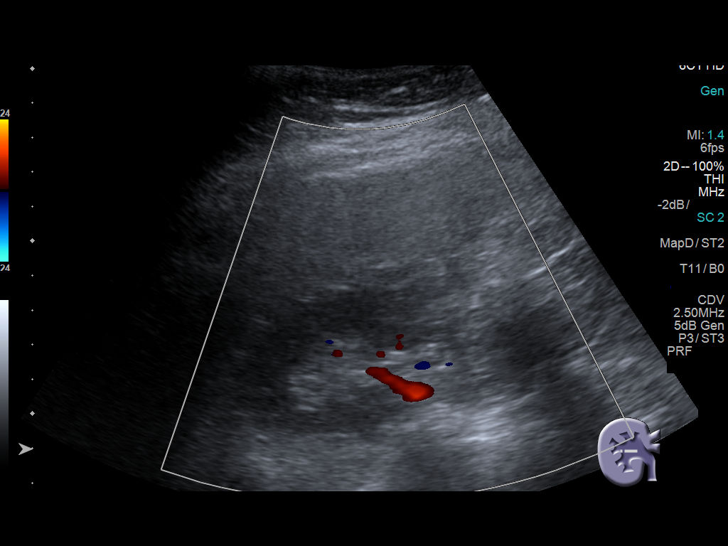
[im 85/97]
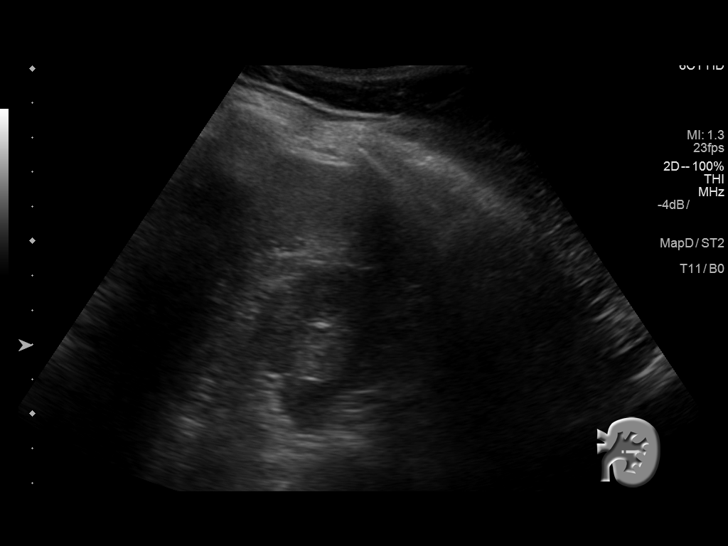
[im 93/97]
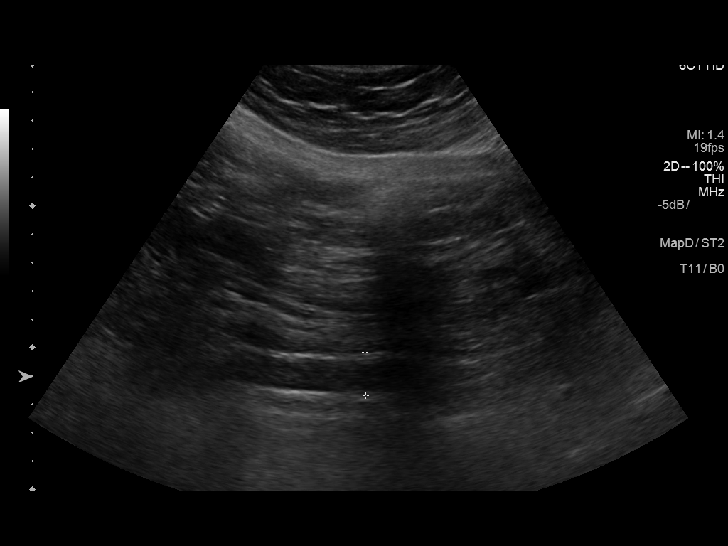

[12 of 25 positions shown; findings below may reference images not displayed]

FINDINGS: ULTRASOUND ABDOMEN

Gallbladder: There is a shadowing 5 mm gallstone in the gallbladder
fundus. There are probably additional subcentimeter gallstones in
the gallbladder neck. No gallbladder distention, gallbladder wall
thickening, pericholecystic fluid or sonographic Murphy sign.

Common bile duct: Diameter: 3 mm

Liver: Liver parenchyma is diffusely moderately to markedly
echogenic with posterior acoustic attenuation, in keeping with
moderate to severe diffuse hepatic steatosis. No definite liver
surface irregularity. No liver mass detected, noting significantly
decreased sensitivity in the setting of an echogenic liver.

IVC: No abnormality visualized.

Pancreas: Visualized portion unremarkable.

Spleen: Size and appearance within normal limits.

Right Kidney: Length: 10.1 cm. Echogenicity within normal limits. No
mass or hydronephrosis visualized.

Left Kidney: Length: 10.1 cm. Echogenicity within normal limits. No
mass or hydronephrosis visualized.

Abdominal aorta: No aneurysm visualized.

Other findings: None.

ULTRASOUND HEPATIC ELASTOGRAPHY

Device: Siemens Helix VTQ

Patient position:  Left Lateral Decubitus

Transducer 6C1

Number of measurements:  10

Hepatic Segment:  8

Median velocity:   2.37  m/sec

IQR:

IQR/Median velocity ratio

Corresponding Metavir fibrosis score:  Some F3 + F4

Risk of fibrosis: High

Limitations of exam: None

Pertinent findings noted on other imaging exams:  None

Please note that abnormal shear wave velocities may also be
identified in clinical settings other than with hepatic fibrosis,
such as: acute hepatitis, elevated right heart and central venous
pressures including use of beta blockers, Hong disease
(Pavlicenco-Lungu), infiltrative processes such as
mastocytosis/amyloidosis/infiltrative tumor, extrahepatic
cholestasis, in the post-prandial state, and liver transplantation.
Correlation with patient history, laboratory data, and clinical
condition recommended.
IMPRESSION: 1. Moderate to severe diffuse hepatic steatosis. No macroscopic
evidence of cirrhosis.
2. Cholelithiasis. No evidence of acute cholecystitis. No biliary
ductal dilatation.
3. Hepatic elastography results:
Median hepatic shear wave velocity is calculated at 2.37 m/sec.

Corresponding Metavir fibrosis score is Some F3 + F4.

Risk of fibrosis is high.

Follow-up:  Followup Advised

## 2017-06-12 ENCOUNTER — Other Ambulatory Visit: Payer: Self-pay | Admitting: Family Medicine

## 2017-06-13 ENCOUNTER — Other Ambulatory Visit: Payer: Self-pay | Admitting: *Deleted

## 2017-06-13 MED ORDER — ROSUVASTATIN CALCIUM 10 MG PO TABS: 10.0000 mg | ORAL_TABLET | Freq: Every day | ORAL | 4 refills | Status: DC

## 2017-07-19 ENCOUNTER — Ambulatory Visit (INDEPENDENT_AMBULATORY_CARE_PROVIDER_SITE_OTHER): Payer: Self-pay | Admitting: Nurse Practitioner

## 2017-07-19 ENCOUNTER — Encounter: Payer: Self-pay | Admitting: Nurse Practitioner

## 2017-07-19 VITALS — BP 130/68 | Ht 66.0 in | Wt 193.0 lb

## 2017-07-19 DIAGNOSIS — E119 Type 2 diabetes mellitus without complications: Secondary | ICD-10-CM

## 2017-07-19 DIAGNOSIS — Z79891 Long term (current) use of opiate analgesic: Secondary | ICD-10-CM

## 2017-07-19 LAB — POCT GLYCOSYLATED HEMOGLOBIN (HGB A1C): Hemoglobin A1C: 5.9

## 2017-07-19 MED ORDER — HYDROCODONE-ACETAMINOPHEN 10-325 MG PO TABS: 1.0000 | ORAL_TABLET | Freq: Four times a day (QID) | ORAL | 0 refills | Status: DC | PRN

## 2017-07-19 MED ORDER — HYDROCODONE-ACETAMINOPHEN 10-325 MG PO TABS
1.0000 | ORAL_TABLET | Freq: Four times a day (QID) | ORAL | 0 refills | Status: DC | PRN
Start: 2017-07-19 — End: 2017-07-19

## 2017-07-19 NOTE — Progress Notes (Signed)
Subjective: This patient was seen today for chronic pain  The medication list was reviewed and updated.   -Compliance with medication: yes  - Number patient states they take daily: 3 times per day  -when was the last dose patient took? Last night  The patient was advised the importance of maintaining medication and not using illegal substances with these.  Refills needed: yes  The patient was educated that we can provide 3 monthly scripts for their medication, it is their responsibility to follow the instructions.  Side effects or complications from medications: none  Patient is aware that pain medications are meant to minimize the severity of the pain to allow their pain levels to improve to allow for better function. They are aware of that pain medications cannot totally remove their pain.  Heat and Diclofenac for pain. Continues to have migratory pain. Medicine allows her to continue work.  Doing well with her diet. Limited activity due to pain.  [place] reviewed.  Due for UDT ( at least once per year) : today  Objective: NAD. Alert, oriented. Lungs clear. Heart RRR.  Results for orders placed or performed in visit on 07/19/17  POCT HgB A1C  Result Value Ref Range   Hemoglobin A1C 5.9    Assessment:  Problem List Items Addressed This Visit      Endocrine   RESOLVED: Type 2 diabetes mellitus not at goal [facility])    Other Visit Diagnoses    Encounter for long-term use of opiate analgesic    -  Primary   Relevant Orders   ToxASSURE Select 13 (MW), Urine     Plan:  Meds ordered this encounter  Medications  . DISCONTD: HYDROcodone-acetaminophen (NORCO) 10-325 MG tablet    Sig: Take 1 tablet by mouth every 6 (six) hours as needed for severe pain.    Dispense:  90 tablet    Refill:  0    Order Specific Question:   Supervising Provider    Answer:   Mikey Kirschner [[id number]]  . DISCONTD: HYDROcodone-acetaminophen (NORCO) 10-325 MG tablet    Sig: Take 1 tablet by mouth every  6 (six) hours as needed for severe pain.    Dispense:  90 tablet    Refill:  0    May fill 30 days from 07/19/17    Order Specific Question:   Supervising Provider    Answer:   Mikey Kirschner [[id number]]  . HYDROcodone-acetaminophen (NORCO) 10-325 MG tablet    Sig: Take 1 tablet by mouth every 6 (six) hours as needed for severe pain.    Dispense:  90 tablet    Refill:  0    May fill 60 days from 07/19/17    Order Specific Question:   Supervising Provider    Answer:   Mikey Kirschner [[id number]]   Hold on further labs due to lack of insurance. Recommend flu vaccine. Patient plans to get this done at a pharmacy to save money.  Return in about 3 months (around 10/19/2017) for pain management.

## 2017-07-21 ENCOUNTER — Encounter: Payer: Self-pay | Admitting: Internal Medicine

## 2017-07-21 ENCOUNTER — Telehealth: Payer: Self-pay | Admitting: Internal Medicine

## 2017-07-21 NOTE — Telephone Encounter (Signed)
RECALL FOR ULTRASOUND 

## 2017-07-21 NOTE — Telephone Encounter (Signed)
Letter mailed

## 2017-07-25 LAB — TOXASSURE SELECT 13 (MW), URINE

## 2017-07-25 LAB — SPECIMEN STATUS REPORT

## 2017-08-16 ENCOUNTER — Telehealth: Payer: Self-pay

## 2017-08-16 NOTE — Telephone Encounter (Signed)
Pt called office and LMOVM,  she received letters that it is time for OV and recall [place]. She has no insurance and will not be able to get insurance until after first of next year. She is unable to have [place] and OV at this time.  Routing to AB as FYI.

## 2017-08-16 NOTE — Telephone Encounter (Signed)
Thanks! No problem. Will look forward to seeing her in January.

## 2017-09-06 ENCOUNTER — Telehealth: Payer: Self-pay | Admitting: Internal Medicine

## 2017-09-06 NOTE — Telephone Encounter (Signed)
Fowarding to [place] as an [place]

## 2017-09-06 NOTE — Telephone Encounter (Signed)
Noted  

## 2017-09-06 NOTE — Telephone Encounter (Signed)
Pt received a letter from [place] that it was time to schedule her U/S and to also follow up with [place] in November. She said that she has lost her insurance and will have new insurance starting in January. She wants to wait to schedule both of this until January.

## 2017-09-12 ENCOUNTER — Other Ambulatory Visit: Payer: Self-pay | Admitting: Family Medicine

## 2017-09-12 ENCOUNTER — Other Ambulatory Visit: Payer: Self-pay | Admitting: Nurse Practitioner

## 2017-09-28 ENCOUNTER — Encounter: Payer: Self-pay | Admitting: Internal Medicine

## 2017-10-12 ENCOUNTER — Other Ambulatory Visit: Payer: Self-pay | Admitting: Nurse Practitioner

## 2017-10-12 ENCOUNTER — Other Ambulatory Visit: Payer: Self-pay | Admitting: Family Medicine

## 2017-10-20 ENCOUNTER — Ambulatory Visit (INDEPENDENT_AMBULATORY_CARE_PROVIDER_SITE_OTHER): Payer: BLUE CROSS/BLUE SHIELD | Admitting: Nurse Practitioner

## 2017-10-20 VITALS — BP 130/82 | Ht 66.0 in | Wt 196.2 lb

## 2017-10-20 DIAGNOSIS — Z79891 Long term (current) use of opiate analgesic: Secondary | ICD-10-CM | POA: Diagnosis not present

## 2017-10-20 DIAGNOSIS — F329 Major depressive disorder, single episode, unspecified: Secondary | ICD-10-CM | POA: Diagnosis not present

## 2017-10-20 DIAGNOSIS — F419 Anxiety disorder, unspecified: Secondary | ICD-10-CM

## 2017-10-20 DIAGNOSIS — Z23 Encounter for immunization: Secondary | ICD-10-CM

## 2017-10-20 DIAGNOSIS — F32A Depression, unspecified: Secondary | ICD-10-CM

## 2017-10-20 MED ORDER — ALPRAZOLAM 1 MG PO TABS: ORAL_TABLET | ORAL | 2 refills | Status: DC

## 2017-10-20 MED ORDER — HYDROCODONE-ACETAMINOPHEN 10-325 MG PO TABS: 1.0000 | ORAL_TABLET | Freq: Four times a day (QID) | ORAL | 0 refills | Status: DC | PRN

## 2017-10-20 NOTE — Progress Notes (Signed)
Subjective: This patient was seen today for chronic pain  The medication list was reviewed and updated.   -Compliance with medication: Yes  - Number patient states they take daily: three  -when was the last dose patient took? Last night  The patient was advised the importance of maintaining medication and not using illegal substances with these.  Refills needed: yes  The patient was educated that we can provide 3 monthly scripts for their medication, it is their responsibility to follow the instructions.  Side effects or complications from medications: none; plans to see GI for recheck of liver now that she has insurance  Patient is aware that pain medications are meant to minimize the severity of the pain to allow their pain levels to improve to allow for better function. They are aware of that pain medications cannot totally remove their pain.  Due for UDT ( at least once per year) : UTD PMP reviewed.   Continues to have severe pain in the hands, left knee and back. Pain on average 8/10 and after pain med 2/10. This allows her to work and perform ADLs.  Takes Xanax no more than twice per day not with her pain medication. Mainly takes for sleep. Has been under more stress but overall stable.   Objective: NAD. Alert, oriented. Lungs clear. Heart RRR. Calm, cheerful affect.   Assessment:  Problem List Items Addressed This Visit      Other   Anxiety and depression   Relevant Medications   ALPRAZolam (XANAX) 1 MG tablet    Other Visit Diagnoses    Encounter for long-term use of opiate analgesic    -  Primary   Need for vaccination       Relevant Orders   Flu Vaccine QUAD 36+ mos IM (Completed)     Plan:  Meds ordered this encounter  Medications  . ALPRAZolam (XANAX) 1 MG tablet    Sig: Take one po BID prn    Dispense:  60 tablet    Refill:  2    Please cancel other xanax prescriptions    Order Specific Question:   Supervising Provider    Answer:   Mikey Kirschner  [[id number]]  . DISCONTD: HYDROcodone-acetaminophen (NORCO) 10-325 MG tablet    Sig: Take 1 tablet by mouth every 6 (six) hours as needed for severe pain.    Dispense:  90 tablet    Refill:  0    May fill 60 days from 10/20/17    Order Specific Question:   Supervising Provider    Answer:   Mikey Kirschner [[id number]]  . DISCONTD: HYDROcodone-acetaminophen (NORCO) 10-325 MG tablet    Sig: Take 1 tablet by mouth every 6 (six) hours as needed for severe pain.    Dispense:  90 tablet    Refill:  0    May fill 30 days from 10/20/17    Order Specific Question:   Supervising Provider    Answer:   Mikey Kirschner [[id number]]  . HYDROcodone-acetaminophen (NORCO) 10-325 MG tablet    Sig: Take 1 tablet by mouth every 6 (six) hours as needed for severe pain.    Dispense:  90 tablet    Refill:  0    Order Specific Question:   Supervising Provider    Answer:   Mikey Kirschner [[id number]]   Again advised caution about use of opiates with benzodiazepines. No changes in medication at this time.  Encouraged activity as tolerated. Reminded about wellness exam. Flu vaccine  today. Return in about 3 months (around 01/18/2018) for pain management.

## 2017-10-21 ENCOUNTER — Encounter: Payer: Self-pay | Admitting: Nurse Practitioner

## 2017-10-23 ENCOUNTER — Telehealth: Payer: Self-pay | Admitting: Family Medicine

## 2017-10-23 NOTE — Telephone Encounter (Signed)
Rx prior auth APPROVED for pt's HYDROcodone-acetaminophen (NORCO) 10-325 MG tablet   Valid 10/20/17-11/18/17 per [employer] Reference# [id number]

## 2017-11-01 ENCOUNTER — Other Ambulatory Visit: Payer: Self-pay | Admitting: Nurse Practitioner

## 2017-11-01 DIAGNOSIS — Z1231 Encounter for screening mammogram for malignant neoplasm of breast: Secondary | ICD-10-CM

## 2017-11-09 ENCOUNTER — Ambulatory Visit (HOSPITAL_COMMUNITY): Payer: Self-pay

## 2017-11-09 ENCOUNTER — Inpatient Hospital Stay (HOSPITAL_COMMUNITY): Admission: RE | Admit: 2017-11-09 | Payer: Self-pay | Source: Ambulatory Visit

## 2017-11-12 ENCOUNTER — Other Ambulatory Visit: Payer: Self-pay | Admitting: Family Medicine

## 2017-12-21 ENCOUNTER — Encounter: Payer: Self-pay | Admitting: Gastroenterology

## 2017-12-21 ENCOUNTER — Ambulatory Visit: Payer: BLUE CROSS/BLUE SHIELD | Admitting: Gastroenterology

## 2017-12-21 VITALS — BP 136/82 | HR 81 | Temp 97.0°F | Ht 66.0 in | Wt 194.6 lb

## 2017-12-21 DIAGNOSIS — K7460 Unspecified cirrhosis of liver: Secondary | ICD-10-CM | POA: Diagnosis not present

## 2017-12-21 DIAGNOSIS — K7581 Nonalcoholic steatohepatitis (NASH): Secondary | ICD-10-CM

## 2017-12-21 NOTE — Patient Instructions (Signed)
Please have labs completed when you are able. I have also ordered an ultrasound of your liver.  We will see you in October 2019!  Let me know if you need anything!  It was good to see you again.  It was a pleasure to see you today. I strive to create trusting relationships with patients to provide genuine, compassionate, and quality care. I value your feedback. If you receive a survey regarding your visit,  I greatly appreciate you taking time to fill this out.   Annitta Needs, PhD, ANP-BC [facility]

## 2017-12-21 NOTE — Progress Notes (Signed)
cc'ed to pcp °

## 2017-12-21 NOTE — Progress Notes (Signed)
Primary Care Physician:  Mikey Kirschner, MD Primary GI: Dr. Gala Romney   Chief Complaint  Patient presents with  . Cirrhosis    HPI:   Catherine Moore is a 62 y.o. female presenting today with a history of biopsy-proven NASH and wedge pressures consistent with portal hypertension. elastography with Metavir F3/F4, early cirrhosis on biopsy. Negative Hep C antibody,  ANA negative, Autoimmune, PBC, celiac serologies, Hep B negative. TSH normal.  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 Ferritin elevated historically and referred to Hematology for further evaluation. Felt to be a bystander reaction in setting of NASH. Next EGD due in 2019. Needs updated US abdomen. Labs needed.    Prilosec once daily. No abdominal pain, N/V, diarrhea, constipation, or rectal bleeding. Has chronic joint pain. 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 Will likely pursue disability in future. Affecting her quality of life. Insurance is also an issue (has marketplace), but she wants to update labs and ultrasound today. Holding off on EGD until later this year. Only rare Mobic and tries to avoid this.    Past Medical History:  Diagnosis Date  . Anxiety   . Arthritis    Rhematoid and Osteoarthritis  . Cirrhosis ([place])   . Complication of anesthesia   . Depression   . Diabetes ([place])   . Hyperlipidemia   . PONV (postoperative nausea and vomiting)     Past Surgical History:  Procedure Laterality Date  . BIOPSY  08/16/2016   Procedure: BIOPSY;  Surgeon: Daneil Dolin, MD;  Location: [facility];  Service: Endoscopy;;  . COLONOSCOPY  08/09/2010   JOI:NOMVEH rectum colon  . ENDOMETRIAL ABLATION    . ESOPHAGOGASTRODUODENOSCOPY N/A 08/16/2016   Dr. Gala Romney: normal esophagus, GAVE, multiple gastric polyps s/p biopsy (hyperplastic), normal duodenum   . IR GENERIC HISTORICAL  06/13/2016   IR TRANSCATHETER BX 06/13/2016 [facility]  . IR GENERIC HISTORICAL  06/13/2016   IR US GUIDE VASC ACCESS RIGHT 06/13/2016  [facility]  . IR GENERIC HISTORICAL  06/13/2016   IR VENOGRAM HEPATIC W HEMODYNAMIC EVALUATION 06/13/2016 [facility]    Current Outpatient Medications  Medication Sig Dispense Refill  . ALPRAZolam (XANAX) 1 MG tablet Take one po BID prn 60 tablet 2  . cetirizine (ZYRTEC) 10 MG tablet TAKE (1) TABLET BY MOUTH ONCE DAILY. 30 tablet 5  . DULoxetine (CYMBALTA) 60 MG capsule TAKE 1 CAPSULE BY MOUTH EVERY DAY 30 capsule 5  . fluticasone (FLONASE) 50 MCG/ACT nasal spray Place 2 sprays into both nostrils daily. 16 g 11  . glipiZIDE (GLUCOTROL XL) 5 MG 24 hr tablet TAKE ONE TABLET BY MOUTH EVERY DAY WITH BREAKFAST 30 tablet 5  . hydrochlorothiazide (HYDRODIURIL) 25 MG tablet Take 1 tablet (25 mg total) by mouth daily. Prn swelling 30 tablet 2  . HYDROcodone-acetaminophen (NORCO) 10-325 MG tablet Take 1 tablet by mouth every 6 (six) hours as needed for severe pain. 90 tablet 0  . meloxicam (MOBIC) 15 MG tablet Take 1 tablet (15 mg total) by mouth daily. Prn pain 30 tablet 2  . metFORMIN (GLUCOPHAGE) 500 MG tablet TAKE ONE TABLET BY MOUTH TWICE DAILY 60 tablet 5  . omeprazole (PRILOSEC) 20 MG capsule TAKE 1 CAPSULE BY MOUTH EVERY DAY 30 capsule 5  . rosuvastatin (CRESTOR) 10 MG tablet TAKE ONE TABLET BY MOUTH DAILY 30 tablet 4  . vitamin B-12 (CYANOCOBALAMIN) 1000 MCG tablet Take 1,000 mcg by mouth daily.     No current facility-administered medications for this visit.  Allergies as of 12/21/2017 - Review Complete 12/21/2017  Allergen Reaction Noted  . Zocor [simvastatin] Nausea Only 02/01/2013  . Penicillins Swelling and Rash 07/19/2010    Family History  Problem Relation Age of Onset  . Diabetes Father   . Hypertension Father   . Heart attack Father 26       MI  . Stroke Father   . Osteoporosis Mother   . Arthritis Sister   . Arthritis Brother   . Colon cancer Neg Hx   . Liver disease Neg Hx     Social History   Socioeconomic History  . Marital status: Married    Spouse  name: None  . Number of children: None  . Years of education: None  . Highest education level: None  Social Needs  . Financial resource strain: None  . Food insecurity - worry: None  . Food insecurity - inability: None  . Transportation needs - medical: None  . Transportation needs - non-medical: None  Occupational History  . None  Tobacco Use  . Smoking status: Former Smoker    Packs/day: 1.50    Years: 20.00    Pack years: 30.00    Types: Cigarettes    Start date: 05/23/1971    Last attempt to quit: 06/16/1991    Years since quitting: 26.5  . Smokeless tobacco: Never Used  Substance and Sexual Activity  . Alcohol use: No    Alcohol/week: 0.0 oz  . Drug use: No  . Sexual activity: Yes    Birth control/protection: Post-menopausal, Surgical  Other Topics Concern  . None  Social History Narrative  . None    Review of Systems: As mentioned in HPI   Physical Exam: BP 136/82   Pulse 81   Temp (!) 97 F (36.1 C) (Oral)   Ht 5' 6"  (1.676 m)   Wt 194 lb 9.6 oz (88.3 kg)   BMI 31.41 kg/m  General:   Alert and oriented. No distress noted. Pleasant and cooperative.  Head:  Normocephalic and atraumatic. Eyes:  Conjuctiva clear without scleral icterus. Mouth:  Oral mucosa pink and moist. Good dentition. No lesions. Lungs: CTAB Cardiac: S1 S2 present without murmurs  Abdomen:  +BS, soft, non-tender and non-distended. No rebound or guarding. No HSM or masses noted. Msk:  Symmetrical without gross deformities. Normal posture. Extremities:  Without edema. Neurologic:  Alert and  oriented x4 Psych:  Alert and cooperative. Normal mood and affect.

## 2017-12-21 NOTE — Assessment & Plan Note (Signed)
62 year old female with biopsy-proven NASH, elevated ferritin likely bystander reaction in this setting. Has had extensive serologies. US abdomen and labs due now. EGD will be due later 2019. No concerning lower or upper GI symptoms. Return in Oct 2019 to arrange EGD. Weight loss, dietary modifications.

## 2017-12-29 ENCOUNTER — Ambulatory Visit (HOSPITAL_COMMUNITY): Payer: BLUE CROSS/BLUE SHIELD

## 2018-01-05 ENCOUNTER — Ambulatory Visit (HOSPITAL_COMMUNITY)
Admission: RE | Admit: 2018-01-05 | Discharge: 2018-01-05 | Disposition: A | Discharge status: Home / Self Care | Payer: BLUE CROSS/BLUE SHIELD | Source: Ambulatory Visit | Attending: Gastroenterology | Admitting: Gastroenterology

## 2018-01-05 DIAGNOSIS — K7460 Unspecified cirrhosis of liver: Secondary | ICD-10-CM | POA: Diagnosis not present

## 2018-01-05 DIAGNOSIS — K760 Fatty (change of) liver, not elsewhere classified: Secondary | ICD-10-CM | POA: Insufficient documentation

## 2018-01-05 DIAGNOSIS — K8020 Calculus of gallbladder without cholecystitis without obstruction: Secondary | ICD-10-CM | POA: Insufficient documentation

## 2018-01-05 DIAGNOSIS — K7581 Nonalcoholic steatohepatitis (NASH): Secondary | ICD-10-CM | POA: Insufficient documentation

## 2018-01-08 NOTE — Progress Notes (Signed)
Eithel: fatty liver noted. You have gallstones but nothing acute. We will repeat in 6 months!

## 2018-01-12 ENCOUNTER — Other Ambulatory Visit: Payer: Self-pay | Admitting: Nurse Practitioner

## 2018-01-16 DIAGNOSIS — M19011 Primary osteoarthritis, right shoulder: Secondary | ICD-10-CM | POA: Diagnosis not present

## 2018-01-16 DIAGNOSIS — M19041 Primary osteoarthritis, right hand: Secondary | ICD-10-CM | POA: Diagnosis not present

## 2018-01-16 DIAGNOSIS — M19012 Primary osteoarthritis, left shoulder: Secondary | ICD-10-CM | POA: Diagnosis not present

## 2018-01-16 DIAGNOSIS — M5136 Other intervertebral disc degeneration, lumbar region: Secondary | ICD-10-CM | POA: Diagnosis not present

## 2018-01-16 DIAGNOSIS — M19042 Primary osteoarthritis, left hand: Secondary | ICD-10-CM | POA: Diagnosis not present

## 2018-01-17 ENCOUNTER — Encounter: Payer: Self-pay | Admitting: Nurse Practitioner

## 2018-01-17 ENCOUNTER — Ambulatory Visit: Payer: BLUE CROSS/BLUE SHIELD | Admitting: Nurse Practitioner

## 2018-01-17 VITALS — BP 132/88 | Temp 98.4°F | Ht 66.0 in | Wt 197.0 lb

## 2018-01-17 DIAGNOSIS — Z79891 Long term (current) use of opiate analgesic: Secondary | ICD-10-CM

## 2018-01-17 MED ORDER — HYDROCODONE-ACETAMINOPHEN 10-325 MG PO TABS: 1.0000 | ORAL_TABLET | Freq: Four times a day (QID) | ORAL | 0 refills | Status: DC | PRN

## 2018-01-17 NOTE — Progress Notes (Signed)
CC: pain management.  HPI: patient is here today for chronic pain management.  She has arthritis in her hands and shoulders.  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 Recently her shoulders have been swollen and inflamed.  She saw her rheumatologist for this, who prescribed her prednisone.  She has not started taking the prednisone yet.  She is taking her pain medicine three times a day every day.  Before her shoulder inflammation her baseline pain was a 5/10, and the pain medicine brought it down to 2/10.  Now her baseline is 10/10 and the pain medicine brings it down to 3-4/10.  She is also using ice and heat to relieve the shoulder pain. She has to lay on her back to sleep and the shoulder pain has been affecting her sleep.  She is also concerned about a bump on her left side that appeared a few days ago, she cannot remember any injury that would have caused it.  It is sore but the soreness has decreased.  Past Medical History:  Diagnosis Date  . Anxiety   . Arthritis    Rhematoid and Osteoarthritis  . Cirrhosis ([place])   . Complication of anesthesia   . Depression   . Diabetes ([place])   . Hyperlipidemia   . PONV (postoperative nausea and vomiting)    Past Surgical History:  Procedure Laterality Date  . BIOPSY  08/16/2016   Procedure: BIOPSY;  Surgeon: Daneil Dolin, MD;  Location: [facility];  Service: Endoscopy;;  . COLONOSCOPY  08/09/2010   [id number] rectum colon  . ENDOMETRIAL ABLATION    . ESOPHAGOGASTRODUODENOSCOPY N/A 08/16/2016   Dr. Gala Romney: normal esophagus, GAVE, multiple gastric polyps s/p biopsy (hyperplastic), normal duodenum   . IR GENERIC HISTORICAL  06/13/2016   IR TRANSCATHETER BX 06/13/2016 [facility]  . IR GENERIC HISTORICAL  06/13/2016   IR US GUIDE VASC ACCESS RIGHT 06/13/2016 [facility]  . IR GENERIC HISTORICAL  06/13/2016   IR VENOGRAM HEPATIC  W HEMODYNAMIC EVALUATION 06/13/2016 [facility]   No hospitalizations, accidents, or injuries in the past three months.  Allergies  Allergen Reactions  . Zocor [Simvastatin] Nausea Only    Pt's Dr took pt off Zocor due to nausea Pt's Dr took pt off med  . Penicillins Swelling and Rash    REACTION: swelling Has patient had a PCN reaction causing immediate rash, facial/tongue/throat swelling, SOB or lightheadedness with hypotension:Yes Has patient had a PCN reaction causing severe rash involving mucus membranes or skin necrosis:NO Has patient had a PCN reaction that required hospitalization No Has patient had a PCN reaction occurring within the last 10 years: No If all of the above answers are "NO", then may proceed with Cephalosporin use. REACTION: swelling REACTION: swelling Has patient had a PCN reaction causing immediate rash, facial/tongue/throat swelling, SOB or lightheadedness with hypotension:Yes Has patient had a PCN reaction causing severe rash involving mucus membranes or skin necrosis:NO Has patient had a PCN reaction that required hospitalization No Has patient had a PCN reaction occurring within the last 10 years: No If all of the above answers are "NO", then may proceed with Cephalosporin use.    Current Outpatient Medications on File Prior to Visit  Medication Sig Dispense Refill  . ALPRAZolam (XANAX) 1 MG tablet TAKE ONE TABLET BY MOUTH TWICE DAILY AS NEEDED 60 tablet 2  . cetirizine (ZYRTEC) 10 MG tablet TAKE (1) TABLET  BY MOUTH ONCE DAILY. 30 tablet 5  . DULoxetine (CYMBALTA) 60 MG capsule TAKE 1 CAPSULE BY MOUTH EVERY DAY 30 capsule 5  . fluticasone (FLONASE) 50 MCG/ACT nasal spray Place 2 sprays into both nostrils daily. 16 g 11  . glipiZIDE (GLUCOTROL XL) 5 MG 24 hr tablet TAKE ONE TABLET BY MOUTH EVERY DAY WITH BREAKFAST 30 tablet 5  . hydrochlorothiazide (HYDRODIURIL) 25 MG tablet Take 1 tablet (25 mg total) by mouth daily. Prn swelling 30 tablet 2  . meloxicam  (MOBIC) 15 MG tablet Take 1 tablet (15 mg total) by mouth daily. Prn pain 30 tablet 2  . metFORMIN (GLUCOPHAGE) 500 MG tablet TAKE ONE TABLET BY MOUTH TWICE DAILY 60 tablet 5  . omeprazole (PRILOSEC) 20 MG capsule TAKE 1 CAPSULE BY MOUTH EVERY DAY 30 capsule 5  . rosuvastatin (CRESTOR) 10 MG tablet TAKE ONE TABLET BY MOUTH DAILY 30 tablet 4  . vitamin B-12 (CYANOCOBALAMIN) 1000 MCG tablet Take 1,000 mcg by mouth daily.     No current facility-administered medications on file prior to visit.    Family History  Problem Relation Age of Onset  . Diabetes Father   . Hypertension Father   . Heart attack Father 54       MI  . Stroke Father   . Osteoporosis Mother   . Arthritis Sister   . Arthritis Brother   . Colon cancer Neg Hx   . Liver disease Neg Hx    Immunization History  Administered Date(s) Administered  . Influenza, Seasonal, Injecte, Preservative Fre 07/07/2016  . Influenza,inj,Quad PF,6+ Mos 10/20/2017  . Influenza-Unspecified 07/17/2012, 09/15/2014  . Pneumococcal Polysaccharide-23 10/04/2016  . Tdap 07/27/1998   Social history: currently works as a Secretary/administrator but her pain is making it difficult for her to do her work.  She tries to eat healthy.  Her knee pain makes it hard for her to exercise, but now that the weather is warming up she plans to go out and walk.  She was a former smoker 1.5ppd for twenty years, quit in 1992.  She does not drink alcohol or use ilicit drugs.  Her husband is her only sexual partner and they do not use protection.  She has not traveled outside the [place] in the past six months.  LMP: patient is postmenopausal  ROS: see HPI General: some fatigue related to sleep loss Respiratory: no shortness of breath, wheezing, or chest tightness CV: no chest pain MS: Reduced shoulder ROM due to pain, cannot lift bilateral arms over her head Neuro: having trouble focusing; says there is a lot going on in her life and she is very stressed  PE General: not in  distress Respiratory: normal respiratory effort, normal breath sounds, no accessory muscle use. CV: S1S2 heard no adventitious sounds; RRR MS: bilateral shoulders limited ROS, cannot lift shoulders above shoulder height; bilateral hands swollen with nodules on finger joints. Skin: small circular bruise with white center on left side, midline, slightly above bra line. Bruise is not raised. On palpation a small circular cyst can be felt. No pain on palpation.  Blood pressure 132/88, temperature 98.4 F (36.9 C), temperature source Oral, height 5' 6"  (1.676 m), weight 197 lb (89.4 kg). Body mass index is 31.8 kg/m.  Assessment and plan:  Problem List Items Addressed This Visit      Other   Encounter for long-term opiate analgesic use - Primary     Meds ordered this encounter  Medications  . DISCONTD: HYDROcodone-acetaminophen (  NORCO) 10-325 MG tablet    Sig: Take 1 tablet by mouth every 6 (six) hours as needed for severe pain.    Dispense:  90 tablet    Refill:  0    Order Specific Question:   Supervising Provider    Answer:   Mikey Kirschner [[id number]]  . DISCONTD: HYDROcodone-acetaminophen (NORCO) 10-325 MG tablet    Sig: Take 1 tablet by mouth every 6 (six) hours as needed for severe pain.    Dispense:  90 tablet    Refill:  0    May fill 30 days from 01/17/2018    Order Specific Question:   Supervising Provider    Answer:   Mikey Kirschner [[id number]]  . HYDROcodone-acetaminophen (NORCO) 10-325 MG tablet    Sig: Take 1 tablet by mouth every 6 (six) hours as needed for severe pain.    Dispense:  90 tablet    Refill:  0    May fill 60 days from 01/17/2018    Order Specific Question:   Supervising Provider    Answer:   Mikey Kirschner [[id number]]   Teaching: Start prednisone as soon as possible; it will take two or three days to work but it will help with the pain.  Watch the bruise on your side and if it gets bigger, red, swollen, or you develop a fever call the office. Otherwise expect  continued gradual resolution.  Encouraged her to make an annual wellness appointment and a diabetes check appointment.   Return in about 3 months (around 04/18/2018) for pain management.

## 2018-01-24 ENCOUNTER — Other Ambulatory Visit: Payer: Self-pay | Admitting: Gastroenterology

## 2018-01-24 DIAGNOSIS — M2550 Pain in unspecified joint: Secondary | ICD-10-CM | POA: Diagnosis not present

## 2018-01-24 DIAGNOSIS — K7460 Unspecified cirrhosis of liver: Secondary | ICD-10-CM | POA: Diagnosis not present

## 2018-01-24 DIAGNOSIS — K7581 Nonalcoholic steatohepatitis (NASH): Secondary | ICD-10-CM | POA: Diagnosis not present

## 2018-01-25 ENCOUNTER — Telehealth: Payer: Self-pay

## 2018-01-25 LAB — CBC WITH DIFFERENTIAL/PLATELET
BASOS: 1 %
Basophils Absolute: 0.1 10*3/uL (ref 0.0–0.2)
EOS (ABSOLUTE): 0.3 10*3/uL (ref 0.0–0.4)
Eos: 3 %
HEMATOCRIT: 44.2 % (ref 34.0–46.6)
Hemoglobin: 14.8 g/dL (ref 11.1–15.9)
IMMATURE GRANS (ABS): 0 10*3/uL (ref 0.0–0.1)
Immature Granulocytes: 0 %
LYMPHS: 45 %
Lymphocytes Absolute: 5.1 10*3/uL — ABNORMAL HIGH (ref 0.7–3.1)
MCH: 29.1 pg (ref 26.6–33.0)
MCHC: 33.5 g/dL (ref 31.5–35.7)
MCV: 87 fL (ref 79–97)
MONOCYTES: 6 %
Monocytes Absolute: 0.7 10*3/uL (ref 0.1–0.9)
NEUTROS ABS: 5 10*3/uL (ref 1.4–7.0)
Neutrophils: 45 %
Platelets: 270 10*3/uL (ref 150–379)
RBC: 5.09 x10E6/uL (ref 3.77–5.28)
RDW: 13.3 % (ref 12.3–15.4)
WBC: 11.1 10*3/uL — ABNORMAL HIGH (ref 3.4–10.8)

## 2018-01-25 LAB — PROTIME-INR
INR: 1 (ref 0.8–1.2)
Prothrombin Time: 10.2 s (ref 9.1–12.0)

## 2018-01-25 LAB — IRON,TIBC AND FERRITIN PANEL
Ferritin: 74 ng/mL (ref 15–150)
IRON SATURATION: 32 % (ref 15–55)
Iron: 100 ug/dL (ref 27–139)
TIBC: 317 ug/dL (ref 250–450)
UIBC: 217 ug/dL (ref 118–369)

## 2018-01-25 NOTE — Telephone Encounter (Signed)
Outside labs from April 2019:   WBC count 11.1, Hgb 14.8, Platelets 270. Iron 100, ferritin 74, INR 1.0.   I had also ordered a CMP, but I don't see this was done? Scanning into epic.

## 2018-01-25 NOTE — Telephone Encounter (Signed)
[employer] results received on fax and placed in Anna's box for review. WBC was 11.1.

## 2018-01-26 NOTE — Telephone Encounter (Signed)
CMP was not done, that order was put in for [facility] and not [facility] and spoke to [employer] and had the CMP added.

## 2018-01-26 NOTE — Progress Notes (Signed)
Labs already reviewed from [facility]. See phone note.

## 2018-01-30 NOTE — Progress Notes (Signed)
Labs look great! Liver numbers are completely normal. (sent in MyChart).

## 2018-02-05 LAB — COMPREHENSIVE METABOLIC PANEL
ALT: 20 IU/L (ref 0–32)
AST: 15 IU/L (ref 0–40)
Albumin/Globulin Ratio: 1.8 (ref 1.2–2.2)
Albumin: 4.5 g/dL (ref 3.6–4.8)
Alkaline Phosphatase: 63 IU/L (ref 39–117)
BUN/Creatinine Ratio: 15 (ref 12–28)
BUN: 12 mg/dL (ref 8–27)
CALCIUM: 9.9 mg/dL (ref 8.7–10.3)
CO2: 25 mmol/L (ref 20–29)
Chloride: 100 mmol/L (ref 96–106)
Creatinine, Ser: 0.82 mg/dL (ref 0.57–1.00)
GFR, EST AFRICAN AMERICAN: 89 mL/min/{1.73_m2} (ref >59)
GFR, EST NON AFRICAN AMERICAN: 77 mL/min/{1.73_m2} (ref >59)
Globulin, Total: 2.5 g/dL (ref 1.5–4.5)
Glucose: 114 mg/dL — ABNORMAL HIGH (ref 65–99)
POTASSIUM: 4 mmol/L (ref 3.5–5.2)
Sodium: 144 mmol/L (ref 134–144)
Total Protein: 7 g/dL (ref 6.0–8.5)

## 2018-02-05 LAB — SPECIMEN STATUS REPORT

## 2018-02-06 DIAGNOSIS — M19011 Primary osteoarthritis, right shoulder: Secondary | ICD-10-CM | POA: Diagnosis not present

## 2018-02-06 DIAGNOSIS — M5136 Other intervertebral disc degeneration, lumbar region: Secondary | ICD-10-CM | POA: Diagnosis not present

## 2018-03-14 ENCOUNTER — Other Ambulatory Visit: Payer: Self-pay | Admitting: Family Medicine

## 2018-04-05 ENCOUNTER — Ambulatory Visit: Payer: BLUE CROSS/BLUE SHIELD | Admitting: Nurse Practitioner

## 2018-04-05 ENCOUNTER — Encounter: Payer: Self-pay | Admitting: Nurse Practitioner

## 2018-04-05 VITALS — BP 130/84 | Ht 66.0 in | Wt 198.4 lb

## 2018-04-05 DIAGNOSIS — E119 Type 2 diabetes mellitus without complications: Secondary | ICD-10-CM

## 2018-04-05 DIAGNOSIS — Z79891 Long term (current) use of opiate analgesic: Secondary | ICD-10-CM | POA: Diagnosis not present

## 2018-04-05 DIAGNOSIS — Z79899 Other long term (current) drug therapy: Secondary | ICD-10-CM | POA: Diagnosis not present

## 2018-04-05 DIAGNOSIS — Z1329 Encounter for screening for other suspected endocrine disorder: Secondary | ICD-10-CM

## 2018-04-05 MED ORDER — HYDROCODONE-ACETAMINOPHEN 10-325 MG PO TABS: 1.0000 | ORAL_TABLET | Freq: Four times a day (QID) | ORAL | 0 refills | Status: DC | PRN

## 2018-04-06 ENCOUNTER — Other Ambulatory Visit: Payer: Self-pay | Admitting: Nurse Practitioner

## 2018-04-07 ENCOUNTER — Encounter: Payer: Self-pay | Admitting: Nurse Practitioner

## 2018-04-07 NOTE — Progress Notes (Signed)
Subjective: Presents for chronic pain management and follow-up on her diabetes.  Fasting sugar is running around 110.  Hemoglobin A1c in our office is not working today.  Has stopped drinking soft drinks, was drinking 3/day.  Pain level depends on her activity especially prolonged standing while at work.  Pain medication allows her to still function and work.  Pain at its worse described as 7/10 after taking pain medicine 2-3/10.  Denies any adverse effects from the pain medicine.  Her last dose was this morning.  Continues follow-up with her orthopedic specialist. Denies CP/ischemic type pain or SOB. No visual changes. No difficulty speaking or swallowing. No numbness or weakness of the face, arms or legs.  Has started walking more for her exercise.   Objective:   BP 130/84   Ht 5' 6"  (1.676 m)   Wt 198 lb 6.4 oz (90 kg)   BMI 32.02 kg/m  NAD.  Alert, oriented.  Lungs clear.  Heart regular rate and rhythm.  Lower extremities no edema. Diabetic Foot Exam - Simple   Simple Foot Form Diabetic Foot exam was performed with the following findings:  Yes 04/05/2018  3:00 PM  Visual Inspection No deformities, no ulcerations, no other skin breakdown bilaterally:  Yes Sensation Testing Intact to touch and monofilament testing bilaterally:  Yes Pulse Check See comments:  Yes Comments DP pulses present bilateral.  Skin intact.  Toes warm with normal capillary refill.      Assessment:   Problem List Items Addressed This Visit      Other   Encounter for long-term opiate analgesic use    Other Visit Diagnoses    Type 2 diabetes mellitus without complication, unspecified whether long term insulin use ([place])    -  Primary   Relevant Orders   POCT HgB A1C   Hemoglobin A1c   Urine Microalbumin w/creat. ratio   Screening for thyroid disorder       Relevant Orders   TSH   High risk medication use       Relevant Orders   Lipid Profile   Urine Microalbumin w/creat. ratio       Plan:   Meds  ordered this encounter  Medications  . DISCONTD: HYDROcodone-acetaminophen (NORCO) 10-325 MG tablet    Sig: Take 1 tablet by mouth every 6 (six) hours as needed for severe pain.    Dispense:  90 tablet    Refill:  0    May fill 60 days from 04/05/18    Order Specific Question:   Supervising Provider    Answer:   Mikey Kirschner [[id number]]  . DISCONTD: HYDROcodone-acetaminophen (NORCO) 10-325 MG tablet    Sig: Take 1 tablet by mouth every 6 (six) hours as needed for severe pain.    Dispense:  90 tablet    Refill:  0    May fill 30 days from 04/05/18    Order Specific Question:   Supervising Provider    Answer:   Mikey Kirschner [[id number]]  . HYDROcodone-acetaminophen (NORCO) 10-325 MG tablet    Sig: Take 1 tablet by mouth every 6 (six) hours as needed for severe pain.    Dispense:  90 tablet    Refill:  0    Order Specific Question:   Supervising Provider    Answer:   Mikey Kirschner [[id number]]   Continue activity such as walking as tolerated.  Routine labs pending.  Continue follow-up with GI specialist and orthopedic specialist as planned. Return in about 3  months (around 07/06/2018) for pain management. 25 minutes was spent with the patient.  This statement verifies that 25 minutes was indeed spent with the patient. Greater than half the time was spent in discussion, counseling and answering questions  regarding the issues that the patient came in for today as reflected in the diagnosis (s) please refer to documentation for further details.

## 2018-04-18 ENCOUNTER — Encounter: Payer: BLUE CROSS/BLUE SHIELD | Admitting: Nurse Practitioner

## 2018-04-20 ENCOUNTER — Other Ambulatory Visit: Payer: Self-pay | Admitting: Nurse Practitioner

## 2018-04-24 ENCOUNTER — Encounter: Payer: Self-pay | Admitting: Nurse Practitioner

## 2018-04-24 ENCOUNTER — Ambulatory Visit (INDEPENDENT_AMBULATORY_CARE_PROVIDER_SITE_OTHER): Payer: BLUE CROSS/BLUE SHIELD | Admitting: Nurse Practitioner

## 2018-04-24 VITALS — BP 130/84 | Ht 66.0 in | Wt 196.6 lb

## 2018-04-24 DIAGNOSIS — Z01419 Encounter for gynecological examination (general) (routine) without abnormal findings: Secondary | ICD-10-CM | POA: Diagnosis not present

## 2018-04-24 DIAGNOSIS — H9192 Unspecified hearing loss, left ear: Secondary | ICD-10-CM

## 2018-04-24 DIAGNOSIS — Z0000 Encounter for general adult medical examination without abnormal findings: Secondary | ICD-10-CM

## 2018-04-24 DIAGNOSIS — Z124 Encounter for screening for malignant neoplasm of cervix: Secondary | ICD-10-CM | POA: Diagnosis not present

## 2018-04-24 DIAGNOSIS — Z79899 Other long term (current) drug therapy: Secondary | ICD-10-CM | POA: Diagnosis not present

## 2018-04-24 DIAGNOSIS — Z1329 Encounter for screening for other suspected endocrine disorder: Secondary | ICD-10-CM | POA: Diagnosis not present

## 2018-04-24 DIAGNOSIS — Z1151 Encounter for screening for human papillomavirus (HPV): Secondary | ICD-10-CM

## 2018-04-24 DIAGNOSIS — E119 Type 2 diabetes mellitus without complications: Secondary | ICD-10-CM | POA: Diagnosis not present

## 2018-04-24 NOTE — Progress Notes (Signed)
   Subjective:    Patient ID: Catherine Moore, female    DOB: 27-Mar-1956, 62 y.o.   MRN: [id number]  HPI presents for her wellness exam. No vaginal bleeding or pelvic pain. Same sexual partner. Overall healthy diet. Gets regular vision exams. Needs dental exam. Tries to walk about 3 times per week.  Depression screen [facility] 2/9 04/24/2018 01/17/2018  Decreased Interest 0 2  Down, Depressed, Hopeless 0 2  PHQ - 2 Score 0 4  Altered sleeping - 2  Tired, decreased energy - 2  Change in appetite - 2  Feeling bad or failure about yourself  - 0  Trouble concentrating - 0  Moving slowly or fidgety/restless - 0  Suicidal thoughts - 0  PHQ-9 Score - 10       Review of Systems  Constitutional: Negative for activity change, appetite change and fatigue.  HENT: Positive for ear pain and hearing loss. Negative for dental problem, sinus pressure and sore throat.        Ear pressure with occasional pain left ear. Difficulty hearing x 6 months.   Respiratory: Negative for cough, chest tightness, shortness of breath and wheezing.   Cardiovascular: Negative for chest pain.  Gastrointestinal: Negative for abdominal distention, abdominal pain, constipation, diarrhea, nausea and vomiting.  Genitourinary: Positive for enuresis. Negative for difficulty urinating, dysuria, frequency, genital sores, urgency, vaginal bleeding and vaginal discharge.       Mild urge/stress incontinence. Wears a light pad daily.        Objective:   Physical Exam  Constitutional: She is oriented to person, place, and time. She appears well-developed. No distress.  HENT:  Right Ear: External ear normal.  Left Ear: External ear normal.  Mouth/Throat: Oropharynx is clear and moist.  TMs: retracted bilaterally; no erythema. More on the left.   Neck: Normal range of motion. Neck supple. No tracheal deviation present. No thyromegaly present.  Cardiovascular: Normal rate, regular rhythm and normal heart sounds. Exam reveals no gallop.    No murmur heard. Pulmonary/Chest: Effort normal and breath sounds normal. Right breast exhibits no inverted nipple, no mass, no skin change and no tenderness. Left breast exhibits no inverted nipple, no mass, no skin change and no tenderness. Breasts are symmetrical.  Axillae no adenopathy.   Abdominal: Soft. She exhibits no distension. There is no tenderness.  Genitourinary: Vagina normal and uterus normal. No vaginal discharge found.  Genitourinary Comments: External GU: no rashes or lesions. Vagina: no discharge. Cervix normal in appearance. No CMT. Bimanual exam: no tenderness or obvious masses.   Musculoskeletal: She exhibits no edema.  Lymphadenopathy:    She has no cervical adenopathy.  Neurological: She is alert and oriented to person, place, and time.  Skin: Skin is warm and dry. No rash noted.  Psychiatric: She has a normal mood and affect. Her behavior is normal.          Assessment & Plan:  Well woman exam - Plan: Pap IG and HPV (high risk) DNA detection  Screening for cervical cancer - Plan: Pap IG and HPV (high risk) DNA detection  Screening for HPV (human papillomavirus) - Plan: Pap IG and HPV (high risk) DNA detection  Hearing difficulty of left ear - Plan: Ambulatory referral to ENT  Patient plans to schedule her own mammogram. Encouraged continued activity and daily vitamin D.  Return in about 6 months (around 10/25/2018) for recheck.

## 2018-04-25 LAB — HEMOGLOBIN A1C
Est. average glucose Bld gHb Est-mCnc: 137 mg/dL
HEMOGLOBIN A1C: 6.4 % — AB (ref 4.8–5.6)

## 2018-04-25 LAB — LIPID PANEL
Chol/HDL Ratio: 3.7 ratio (ref 0.0–4.4)
Cholesterol, Total: 159 mg/dL (ref 100–199)
HDL: 43 mg/dL (ref >39)
LDL CALC: 96 mg/dL (ref 0–99)
Triglycerides: 99 mg/dL (ref 0–149)
VLDL Cholesterol Cal: 20 mg/dL (ref 5–40)

## 2018-04-25 LAB — MICROALBUMIN / CREATININE URINE RATIO
Creatinine, Urine: 186.8 mg/dL
MICROALB/CREAT RATIO: 6.5 mg/g{creat} (ref 0.0–30.0)
MICROALBUM., U, RANDOM: 12.2 ug/mL

## 2018-04-25 LAB — TSH: TSH: 2.92 u[IU]/mL (ref 0.450–4.500)

## 2018-04-26 LAB — PAP IG AND HPV HIGH-RISK
HPV, HIGH-RISK: NEGATIVE
PAP SMEAR COMMENT: 0

## 2018-05-02 ENCOUNTER — Other Ambulatory Visit: Payer: Self-pay | Admitting: Family Medicine

## 2018-05-02 ENCOUNTER — Other Ambulatory Visit: Payer: Self-pay | Admitting: Nurse Practitioner

## 2018-05-02 ENCOUNTER — Encounter (INDEPENDENT_AMBULATORY_CARE_PROVIDER_SITE_OTHER): Payer: Self-pay

## 2018-05-02 ENCOUNTER — Encounter: Payer: Self-pay | Admitting: Family Medicine

## 2018-05-09 ENCOUNTER — Other Ambulatory Visit: Payer: Self-pay | Admitting: *Deleted

## 2018-05-09 ENCOUNTER — Other Ambulatory Visit: Payer: Self-pay | Admitting: Nurse Practitioner

## 2018-05-09 MED ORDER — FLUTICASONE PROPIONATE 50 MCG/ACT NA SUSP: 2.0000 | Freq: Every day | NASAL | 0 refills | Status: DC

## 2018-05-09 NOTE — Telephone Encounter (Signed)
okplus 5 ref

## 2018-05-31 ENCOUNTER — Other Ambulatory Visit: Payer: Self-pay | Admitting: Family Medicine

## 2018-07-04 ENCOUNTER — Ambulatory Visit: Payer: BLUE CROSS/BLUE SHIELD | Admitting: Family Medicine

## 2018-07-04 ENCOUNTER — Encounter: Payer: Self-pay | Admitting: Family Medicine

## 2018-07-04 VITALS — BP 142/84 | Temp 97.5°F | Ht 66.0 in | Wt 198.0 lb

## 2018-07-04 DIAGNOSIS — E7800 Pure hypercholesterolemia, unspecified: Secondary | ICD-10-CM | POA: Diagnosis not present

## 2018-07-04 DIAGNOSIS — Z79891 Long term (current) use of opiate analgesic: Secondary | ICD-10-CM

## 2018-07-04 DIAGNOSIS — Z23 Encounter for immunization: Secondary | ICD-10-CM | POA: Diagnosis not present

## 2018-07-04 DIAGNOSIS — E119 Type 2 diabetes mellitus without complications: Secondary | ICD-10-CM

## 2018-07-04 DIAGNOSIS — M1712 Unilateral primary osteoarthritis, left knee: Secondary | ICD-10-CM

## 2018-07-04 MED ORDER — HYDROCODONE-ACETAMINOPHEN 10-325 MG PO TABS: 1.0000 | ORAL_TABLET | Freq: Four times a day (QID) | ORAL | 0 refills | Status: DC | PRN

## 2018-07-04 NOTE — Progress Notes (Signed)
   Subjective:    Patient ID: Catherine Moore, female    DOB: August 25, 1956, 62 y.o.   MRN: [id number]  HPI This patient was seen today for chronic pain  The medication list was reviewed and updated.   -Compliance with medication: yes  - Number patient states they take daily: 3  -when was the last dose patient took? Last night  The patient was advised the importance of maintaining medication and not using illegal substances with these.  Here for refills and follow up  The patient was educated that we can provide 3 monthly scripts for their medication, it is their responsibility to follow the instructions.  Side effects or complications from medications: none  Patient is aware that pain medications are meant to minimize the severity of the pain to allow their pain levels to improve to allow for better function. They are aware of that pain medications cannot totally remove their pain.  Due for UDT ( at least once per year) : done today  Pt states she is having pain on left side. Has been going on for a couple of weeks. Pt vomited once this morning and she thinks it is due to sinuses.    Has chronic pain with arthritis , hits in multiple place, neck and arm and ne knees  Review of Systems No headache, no major weight loss or weight gain, no chest pain no back pain abdominal pain no change in bowel habits complete ROS otherwise negative     Objective:   Physical Exam Alert and oriented, vitals reviewed and stable, NAD ENT-TM's and ext canals WNL bilat via otoscopic exam Soft palate, tonsils and post pharynx WNL via oropharyngeal exam Neck-symmetric, no masses; thyroid nonpalpable and nontender Pulmonary-no tachypnea or accessory muscle use; Clear without wheezes via auscultation Card--no abnrml murmurs, rhythm reg and rate WNL Carotid pulses symmetric, without bruits Lateral chest wall tenderness no deformity lungs completely clear in this area       Assessment & Plan:    Impression 1 chronic pain.  Primarily arthritis pain.  Catherine Moore had her on narcotics for this patient states it definitely helped  2.  Hyperlipidemia.  Prior blood work reviewed patient maintain same  3.  Type 2 diabetes.  Last A1c was decent compliant with meds claims no low sugar  4.  History of anxiety see prior note  Toxic your screening.  Pain medicine refilled.  Diet exercise discussed.  Flu shot today.

## 2018-07-05 ENCOUNTER — Ambulatory Visit: Payer: BLUE CROSS/BLUE SHIELD | Admitting: Family Medicine

## 2018-07-09 LAB — SPECIMEN STATUS REPORT

## 2018-07-09 LAB — TOXASSURE SELECT 13 (MW), URINE

## 2018-07-24 ENCOUNTER — Encounter: Payer: Self-pay | Admitting: *Deleted

## 2018-07-24 ENCOUNTER — Other Ambulatory Visit: Payer: Self-pay | Admitting: *Deleted

## 2018-07-24 ENCOUNTER — Ambulatory Visit: Payer: BLUE CROSS/BLUE SHIELD | Admitting: Gastroenterology

## 2018-07-24 ENCOUNTER — Telehealth: Payer: Self-pay | Admitting: *Deleted

## 2018-07-24 VITALS — BP 132/79 | HR 95 | Temp 97.8°F | Ht 66.0 in | Wt 199.0 lb

## 2018-07-24 DIAGNOSIS — K7460 Unspecified cirrhosis of liver: Secondary | ICD-10-CM

## 2018-07-24 DIAGNOSIS — K7581 Nonalcoholic steatohepatitis (NASH): Secondary | ICD-10-CM

## 2018-07-24 DIAGNOSIS — R1012 Left upper quadrant pain: Secondary | ICD-10-CM

## 2018-07-24 NOTE — Telephone Encounter (Signed)
Pre-op scheduled for 08/13/18 at 9:00am. Letter mailed. LMOVM.

## 2018-07-24 NOTE — Patient Instructions (Addendum)
I have ordered the ultrasound to complete in the near future.  Please complete labs at [place] as well.  We have arranged an upper endoscopy with Dr. Gala Romney in the near future. Please do not take metformin or glipizide the morning of the procedure.  We will see you in 6 months!  I enjoyed seeing you again today! As you know, I value our relationship and want to provide genuine, compassionate, and quality care. I welcome your feedback. If you receive a survey regarding your visit,  I greatly appreciate you taking time to fill this out. See you next time!  Annitta Needs, PhD, ANP-BC [facility]

## 2018-07-24 NOTE — Progress Notes (Signed)
Referring Provider: Mikey Kirschner, MD Primary Care Physician:  Mikey Kirschner, MD Primary GI: Dr. Gala Romney   Chief Complaint  Patient presents with  . Cirrhosis    has been sore under both breast    HPI:   Catherine Moore is a 62 y.o. female presenting today with a history of biopsy-proven NASH and wedge pressures consistent with portal hypertension. elastography with Metavir F3/F4, early cirrhosis on biopsy.Negative Hep C antibody, ANA negative, Autoimmune, PBC, celiac serologies, Hep B negative. TSH normal.  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 Ferritin elevated historically and referred to Hematology for further evaluation. Felt to be a bystander reaction in setting of NASH. EGD due now. US abdomen with fatty liver in March 2019. Due for surveillance now.   Has been sore in LUQ/left ribs, also RUQ but moreso LUQ. Feels like it is swollen. Present for about a month. No pain with eating. No N/V. No loss of appetite. No overt GI bleeding. Prilosec 20 mg daily.   Past Medical History:  Diagnosis Date  . Anxiety   . Arthritis    Rhematoid and Osteoarthritis  . Cirrhosis ([place])   . Complication of anesthesia   . Depression   . Diabetes ([place])   . Hyperlipidemia   . PONV (postoperative nausea and vomiting)     Past Surgical History:  Procedure Laterality Date  . BIOPSY  08/16/2016   Procedure: BIOPSY;  Surgeon: Daneil Dolin, MD;  Location: [facility];  Service: Endoscopy;;  . COLONOSCOPY  08/09/2010   BDZ:[id number] rectum colon  . ENDOMETRIAL ABLATION    . ESOPHAGOGASTRODUODENOSCOPY N/A 08/16/2016   Dr. Gala Romney: normal esophagus, GAVE, multiple gastric polyps s/p biopsy (hyperplastic), normal duodenum   . IR GENERIC HISTORICAL  06/13/2016   IR TRANSCATHETER BX 06/13/2016 [facility]  . IR GENERIC HISTORICAL  06/13/2016   IR US GUIDE VASC ACCESS RIGHT 06/13/2016 [facility]  . IR GENERIC HISTORICAL  06/13/2016   IR VENOGRAM HEPATIC W HEMODYNAMIC EVALUATION 06/13/2016  [facility]    Current Outpatient Medications  Medication Sig Dispense Refill  . ALPRAZolam (XANAX) 1 MG tablet TAKE 1 TABLET BY MOUTH TWICE DAILY AS NEEDED 60 tablet 5  . cetirizine (ZYRTEC) 10 MG tablet TAKE (1) TABLET BY MOUTH ONCE DAILY. 30 tablet 5  . DULoxetine (CYMBALTA) 60 MG capsule TAKE 1 CAPSULE BY MOUTH EVERY DAY 30 capsule 5  . fluticasone (FLONASE) 50 MCG/ACT nasal spray Place 2 sprays into both nostrils daily. 16 g 0  . glipiZIDE (GLUCOTROL XL) 5 MG 24 hr tablet TAKE ONE TABLET BY MOUTH EVERY DAY WITH BREAKFAST 30 tablet 5  . hydrochlorothiazide (HYDRODIURIL) 25 MG tablet Take 1 tablet (25 mg total) by mouth daily. Prn swelling (Patient taking differently: Take 25 mg by mouth as needed. Prn swelling) 30 tablet 2  . HYDROcodone-acetaminophen (NORCO) 10-325 MG tablet Take 1 tablet by mouth every 6 (six) hours as needed for severe pain. 90 tablet 0  . metFORMIN (GLUCOPHAGE) 500 MG tablet TAKE 1 TABLET BY MOUTH TWICE DAILY 60 tablet 5  . omeprazole (PRILOSEC) 20 MG capsule TAKE 1 CAPSULE BY MOUTH EVERY DAY 30 capsule 5  . rosuvastatin (CRESTOR) 10 MG tablet TAKE 1 TABLET BY MOUTH DAILY 30 tablet 4  . vitamin B-12 (CYANOCOBALAMIN) 1000 MCG tablet Take 1,000 mcg by mouth daily.    . meloxicam (MOBIC) 15 MG tablet Take 1 tablet (15 mg total) by mouth daily. Prn pain (Patient not taking: Reported on 07/24/2018) 30 tablet  2   No current facility-administered medications for this visit.     Allergies as of 07/24/2018 - Review Complete 07/24/2018  Allergen Reaction Noted  . Zocor [simvastatin] Nausea Only 02/01/2013  . Penicillins Swelling and Rash 07/19/2010    Family History  Problem Relation Age of Onset  . Diabetes Father   . Hypertension Father   . Heart attack Father 48       MI  . Stroke Father   . Osteoporosis Mother   . Arthritis Sister   . Arthritis Brother   . Colon cancer Neg Hx   . Liver disease Neg Hx     Social History   Socioeconomic History  . Marital  status: Married    Spouse name: Not on file  . Number of children: Not on file  . Years of education: Not on file  . Highest education level: Not on file  Occupational History  . Not on file  Social Needs  . Financial resource strain: Not on file  . Food insecurity:    Worry: Not on file    Inability: Not on file  . Transportation needs:    Medical: Not on file    Non-medical: Not on file  Tobacco Use  . Smoking status: Former Smoker    Packs/day: 1.50    Years: 20.00    Pack years: 30.00    Types: Cigarettes    Start date: 05/23/1971    Last attempt to quit: 06/16/1991    Years since quitting: 27.1  . Smokeless tobacco: Never Used  Substance and Sexual Activity  . Alcohol use: No    Alcohol/week: 0.0 standard drinks  . Drug use: No  . Sexual activity: Yes    Birth control/protection: Post-menopausal, Surgical  Lifestyle  . Physical activity:    Days per week: Not on file    Minutes per session: Not on file  . Stress: Not on file  Relationships  . Social connections:    Talks on phone: Not on file    Gets together: Not on file    Attends religious service: Not on file    Active member of club or organization: Not on file    Attends meetings of clubs or organizations: Not on file    Relationship status: Not on file  Other Topics Concern  . Not on file  Social History Narrative  . Not on file    Review of Systems: Gen: Denies fever, chills, anorexia. Denies fatigue, weakness, weight loss.  CV: Denies chest pain, palpitations, syncope, peripheral edema, and claudication. Resp: Denies dyspnea at rest, cough, wheezing, coughing up blood, and pleurisy. GI: see HPI  Derm: Denies rash, itching, dry skin Psych: Denies depression, anxiety, memory loss, confusion. No homicidal or suicidal ideation.  Heme: Denies bruising, bleeding, and enlarged lymph nodes.  Physical Exam: BP 132/79   Pulse 95   Temp 97.8 F (36.6 C) (Oral)   Ht 5' 6"  (1.676 m)   Wt 199 lb (90.3 kg)    BMI 32.12 kg/m  General:   Alert and oriented. No distress noted. Pleasant and cooperative.  Head:  Normocephalic and atraumatic. Eyes:  Conjuctiva clear without scleral icterus. Mouth:  Oral mucosa pink and moist.  Lungs: clear to auscultation bilaterally  Cardiac: S1 S2 present without murmurs  Abdomen:  +BS, soft, non-tender and non-distended. No rebound or guarding. No HSM or masses noted. Msk:  Symmetrical without gross deformities. Normal posture. Extremities:  Without edema. Neurologic:  Alert and  oriented x4 Psych:  Alert and cooperative. Normal mood and affect.  Lab Results  Component Value Date   ALT 20 01/24/2018   AST 15 01/24/2018   ALKPHOS 63 01/24/2018   BILITOT <0.2 01/24/2018

## 2018-07-25 NOTE — Assessment & Plan Note (Signed)
62 year old female with history of biopsy-proven NASH, normal LFTs recently. Elevated ferritin likely bystander reaction. 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 Hematology has elevated. Well-compensated and due for surveillance EGD as last was in 2017. US abdomen due now as well.  Proceed with upper endoscopy in the near future with Dr. Gala Romney. The risks, benefits, and alternatives have been discussed in detail with patient. They have stated understanding and desire to proceed.  Propofol due to polypharmacy US abdomen complete  CBC, CMP, iron studies, INR Return in 6 months

## 2018-07-25 NOTE — Assessment & Plan Note (Signed)
Vague LUQ pain, feeling bloated. No association with eating/drinking. No other alarm features. Instead of limited US, will pursue US abdomen complete. EGD already planned for variceal surveillance. May need to consider CT if persists.

## 2018-07-26 NOTE — Progress Notes (Signed)
cc'ed to pcp °

## 2018-07-27 ENCOUNTER — Ambulatory Visit (HOSPITAL_COMMUNITY)
Admission: RE | Admit: 2018-07-27 | Discharge: 2018-07-27 | Disposition: A | Discharge status: Home / Self Care | Payer: BLUE CROSS/BLUE SHIELD | Source: Ambulatory Visit | Attending: Gastroenterology | Admitting: Gastroenterology

## 2018-07-27 ENCOUNTER — Ambulatory Visit (HOSPITAL_COMMUNITY): Payer: BLUE CROSS/BLUE SHIELD

## 2018-07-27 DIAGNOSIS — K7581 Nonalcoholic steatohepatitis (NASH): Secondary | ICD-10-CM | POA: Insufficient documentation

## 2018-07-27 DIAGNOSIS — K7460 Unspecified cirrhosis of liver: Secondary | ICD-10-CM | POA: Insufficient documentation

## 2018-07-27 DIAGNOSIS — R1012 Left upper quadrant pain: Secondary | ICD-10-CM | POA: Insufficient documentation

## 2018-07-27 DIAGNOSIS — K8020 Calculus of gallbladder without cholecystitis without obstruction: Secondary | ICD-10-CM | POA: Diagnosis not present

## 2018-07-28 LAB — CBC WITH DIFFERENTIAL
BASOS: 1 %
Basophils Absolute: 0.1 10*3/uL (ref 0.0–0.2)
EOS (ABSOLUTE): 0.3 10*3/uL (ref 0.0–0.4)
EOS: 6 %
HEMOGLOBIN: 13.3 g/dL (ref 11.1–15.9)
Hematocrit: 38.7 % (ref 34.0–46.6)
IMMATURE GRANS (ABS): 0 10*3/uL (ref 0.0–0.1)
Immature Granulocytes: 0 %
LYMPHS: 31 %
Lymphocytes Absolute: 1.9 10*3/uL (ref 0.7–3.1)
MCH: 28.5 pg (ref 26.6–33.0)
MCHC: 34.4 g/dL (ref 31.5–35.7)
MCV: 83 fL (ref 79–97)
MONOCYTES: 6 %
Monocytes Absolute: 0.4 10*3/uL (ref 0.1–0.9)
NEUTROS PCT: 56 %
Neutrophils Absolute: 3.4 10*3/uL (ref 1.4–7.0)
RBC: 4.67 x10E6/uL (ref 3.77–5.28)
RDW: 12.8 % (ref 12.3–15.4)
WBC: 6.2 10*3/uL (ref 3.4–10.8)

## 2018-07-28 LAB — PROTIME-INR
INR: 1 (ref 0.8–1.2)
Prothrombin Time: 10.5 s (ref 9.1–12.0)

## 2018-07-28 LAB — IRON,TIBC AND FERRITIN PANEL
Ferritin: 127 ng/mL (ref 15–150)
IRON: 102 ug/dL (ref 27–139)
Iron Saturation: 32 % (ref 15–55)
TIBC: 317 ug/dL (ref 250–450)
UIBC: 215 ug/dL (ref 118–369)

## 2018-08-01 ENCOUNTER — Telehealth: Payer: Self-pay | Admitting: Internal Medicine

## 2018-08-01 NOTE — Telephone Encounter (Signed)
Pt was calling to see if her result from her [place] were available. [phone]

## 2018-08-01 NOTE — Telephone Encounter (Signed)
Pt is inquiring about her results. Pt is aware that we will call her back with results when they're available.

## 2018-08-01 NOTE — H&P (View-Only) (Signed)
Catherine Moore, your ultrasound is stable. You have gallstones but no acute signs of cholecystitis. Let's see what the upper endoscopy shows and go from there. If you start having significant pain after eating, associated nausea and vomiting, we can further evaluate the gallbladder. Sent in [place].

## 2018-08-01 NOTE — Progress Notes (Signed)
Can we add on a CMP? We had ordered it, but it didn't get done. Her labs are stable.

## 2018-08-01 NOTE — Progress Notes (Signed)
Catherine Moore, your ultrasound is stable. You have gallstones but no acute signs of cholecystitis. Let's see what the upper endoscopy shows and go from there. If you start having significant pain after eating, associated nausea and vomiting, we can further evaluate the gallbladder. Sent in [place].

## 2018-08-06 NOTE — Patient Instructions (Signed)
Catherine Moore  08/06/2018     @PREFPERIOPPHARMACY @   Your procedure is scheduled on  08/16/2018.  Report to [name] at  645   A.M.  Call this number if you have problems the morning of surgery:  [phone]   Remember:  Follow the diet instructions given to you by Dr Roseanne Kaufman office.                      Take these medicines the morning of surgery with A SIP OF WATER  Xanax ( if needed), zyrtec, cymbalta, hydrocodone ( if needed), prilosec.    Do not wear jewelry, make-up or nail polish.  Do not wear lotions, powders, or perfumes, or deodorant.  Do not shave 48 hours prior to surgery.  Men may shave face and neck.  Do not bring valuables to the hospital.  [facility] is not responsible for any belongings or valuables.  Contacts, dentures or bridgework may not be worn into surgery.  Leave your suitcase in the car.  After surgery it may be brought to your room.  For patients admitted to the hospital, discharge time will be determined by your treatment team.  Patients discharged the day of surgery will not be allowed to drive home.   Name and phone number of your driver:   family Special instructions:  DO NOT take any medications for diabetes the morning of your procedure.  Please read over the following fact sheets that you were given. Anesthesia Post-op Instructions and Care and Recovery After Surgery       Esophagogastroduodenoscopy Esophagogastroduodenoscopy (EGD) is a procedure to examine the lining of the esophagus, stomach, and first part of the small intestine (duodenum). This procedure is done to check for problems such as inflammation, bleeding, ulcers, or growths. During this procedure, a long, flexible, lighted tube with a camera attached (endoscope) is inserted down the throat. Tell a health care provider about:  Any allergies you have.  All medicines you are taking, including vitamins, herbs, eye drops, creams, and over-the-counter  medicines.  Any problems you or family members have had with anesthetic medicines.  Any blood disorders you have.  Any surgeries you have had.  Any medical conditions you have.  Whether you are pregnant or may be pregnant. What are the risks? Generally, this is a safe procedure. However, problems may occur, including:  Infection.  Bleeding.  A tear (perforation) in the esophagus, stomach, or duodenum.  Trouble breathing.  Excessive sweating.  Spasms of the larynx.  A slowed heartbeat.  Low blood pressure.  What happens before the procedure?  Follow instructions from your health care provider about eating or drinking restrictions.  Ask your health care provider about: ? Changing or stopping your regular medicines. This is especially important if you are taking diabetes medicines or blood thinners. ? Taking medicines such as aspirin and ibuprofen. These medicines can thin your blood. Do not take these medicines before your procedure if your health care provider instructs you not to.  Plan to have someone take you home after the procedure.  If you wear dentures, be ready to remove them before the procedure. What happens during the procedure?  To reduce your risk of infection, your health care team will wash or sanitize their hands.  An IV tube will be put in a vein in your hand or arm. You will get medicines and fluids through this tube.  You  will be given one or more of the following: ? A medicine to help you relax (sedative). ? A medicine to numb the area (local anesthetic). This medicine may be sprayed into your throat. It will make you feel more comfortable and keep you from gagging or coughing during the procedure. ? A medicine for pain.  A mouth guard may be placed in your mouth to protect your teeth and to keep you from biting on the endoscope.  You will be asked to lie on your left side.  The endoscope will be lowered down your throat into your esophagus,  stomach, and duodenum.  Air will be put into the endoscope. This will help your health care provider see better.  The lining of your esophagus, stomach, and duodenum will be examined.  Your health care provider may: ? Take a tissue sample so it can be looked at in a lab (biopsy). ? Remove growths. ? Remove objects (foreign bodies) that are stuck. ? Treat any bleeding with medicines or other devices that stop tissue from bleeding. ? Widen (dilate) or stretch narrowed areas of your esophagus and stomach.  The endoscope will be taken out. The procedure may vary among health care providers and hospitals. What happens after the procedure?  Your blood pressure, heart rate, breathing rate, and blood oxygen level will be monitored often until the medicines you were given have worn off.  Do not eat or drink anything until the numbing medicine has worn off and your gag reflex has returned. This information is not intended to replace advice given to you by your health care provider. Make sure you discuss any questions you have with your health care provider. Document Released: 02/03/2005 Document Revised: 03/10/2016 Document Reviewed: 08/27/2015 Elsevier Interactive Patient Education  2018 [employer]. Esophagogastroduodenoscopy, Care After Refer to this sheet in the next few weeks. These instructions provide you with information about caring for yourself after your procedure. Your health care provider may also give you more specific instructions. Your treatment has been planned according to current medical practices, but problems sometimes occur. Call your health care provider if you have any problems or questions after your procedure. What can I expect after the procedure? After the procedure, it is common to have:  A sore throat.  Nausea.  Bloating.  Dizziness.  Fatigue.  Follow these instructions at home:  Do not eat or drink anything until the numbing medicine (local anesthetic)  has worn off and your gag reflex has returned. You will know that the local anesthetic has worn off when you can swallow comfortably.  Do not drive for 24 hours if you received a medicine to help you relax (sedative).  If your health care provider took a tissue sample for testing during the procedure, make sure to get your test results. This is your responsibility. Ask your health care provider or the department performing the test when your results will be ready.  Keep all follow-up visits as told by your health care provider. This is important. Contact a health care provider if:  You cannot stop coughing.  You are not urinating.  You are urinating less than usual. Get help right away if:  You have trouble swallowing.  You cannot eat or drink.  You have throat or chest pain that gets worse.  You are dizzy or light-headed.  You faint.  You have nausea or vomiting.  You have chills.  You have a fever.  You have severe abdominal pain.  You have black,  tarry, or bloody stools. This information is not intended to replace advice given to you by your health care provider. Make sure you discuss any questions you have with your health care provider. Document Released: 09/19/2012 Document Revised: 03/10/2016 Document Reviewed: 08/27/2015 Elsevier Interactive Patient Education  2018 [place] Anesthesia is a term that refers to techniques, procedures, and medicines that help a person stay safe and comfortable during a medical procedure. Monitored anesthesia care, or sedation, is one type of anesthesia. Your anesthesia specialist may recommend sedation if you will be having a procedure that does not require you to be unconscious, such as:  Cataract surgery.  A dental procedure.  A biopsy.  A colonoscopy.  During the procedure, you may receive a medicine to help you relax (sedative). There are three levels of sedation:  Mild sedation. At this  level, you may feel awake and relaxed. You will be able to follow directions.  Moderate sedation. At this level, you will be sleepy. You may not remember the procedure.  Deep sedation. At this level, you will be asleep. You will not remember the procedure.  The more medicine you are given, the deeper your level of sedation will be. Depending on how you respond to the procedure, the anesthesia specialist may change your level of sedation or the type of anesthesia to fit your needs. An anesthesia specialist will monitor you closely during the procedure. Let your health care provider know about:  Any allergies you have.  All medicines you are taking, including vitamins, herbs, eye drops, creams, and over-the-counter medicines.  Any use of steroids (by mouth or as a cream).  Any problems you or family members have had with sedatives and anesthetic medicines.  Any blood disorders you have.  Any surgeries you have had.  Any medical conditions you have, such as sleep apnea.  Whether you are pregnant or may be pregnant.  Any use of cigarettes, alcohol, or street drugs. What are the risks? Generally, this is a safe procedure. However, problems may occur, including:  Getting too much medicine (oversedation).  Nausea.  Allergic reaction to medicines.  Trouble breathing. If this happens, a breathing tube may be used to help with breathing. It will be removed when you are awake and breathing on your own.  Heart trouble.  Lung trouble.  Before the procedure Staying hydrated Follow instructions from your health care provider about hydration, which may include:  Up to 2 hours before the procedure - you may continue to drink clear liquids, such as water, clear fruit juice, black coffee, and plain tea.  Eating and drinking restrictions Follow instructions from your health care provider about eating and drinking, which may include:  8 hours before the procedure - stop eating heavy  meals or foods such as meat, fried foods, or fatty foods.  6 hours before the procedure - stop eating light meals or foods, such as toast or cereal.  6 hours before the procedure - stop drinking milk or drinks that contain milk.  2 hours before the procedure - stop drinking clear liquids.  Medicines Ask your health care provider about:  Changing or stopping your regular medicines. This is especially important if you are taking diabetes medicines or blood thinners.  Taking medicines such as aspirin and ibuprofen. These medicines can thin your blood. Do not take these medicines before your procedure if your health care provider instructs you not to.  Tests and exams  You will have a physical  exam.  You may have blood tests done to show: ? How well your kidneys and liver are working. ? How well your blood can clot.  General instructions  Plan to have someone take you home from the hospital or clinic.  If you will be going home right after the procedure, plan to have someone with you for 24 hours.  What happens during the procedure?  Your blood pressure, heart rate, breathing, level of pain and overall condition will be monitored.  An IV tube will be inserted into one of your veins.  Your anesthesia specialist will give you medicines as needed to keep you comfortable during the procedure. This may mean changing the level of sedation.  The procedure will be performed. After the procedure  Your blood pressure, heart rate, breathing rate, and blood oxygen level will be monitored until the medicines you were given have worn off.  Do not drive for 24 hours if you received a sedative.  You may: ? Feel sleepy, clumsy, or nauseous. ? Feel forgetful about what happened after the procedure. ? Have a sore throat if you had a breathing tube during the procedure. ? Vomit. This information is not intended to replace advice given to you by your health care provider. Make sure you  discuss any questions you have with your health care provider. Document Released: 06/29/2005 Document Revised: 03/11/2016 Document Reviewed: 01/24/2016 Elsevier Interactive Patient Education  2018 [place], Care After These instructions provide you with information about caring for yourself after your procedure. Your health care provider may also give you more specific instructions. Your treatment has been planned according to current medical practices, but problems sometimes occur. Call your health care provider if you have any problems or questions after your procedure. What can I expect after the procedure? After your procedure, it is common to:  Feel sleepy for several hours.  Feel clumsy and have poor balance for several hours.  Feel forgetful about what happened after the procedure.  Have poor judgment for several hours.  Feel nauseous or vomit.  Have a sore throat if you had a breathing tube during the procedure.  Follow these instructions at home: For at least 24 hours after the procedure:   Do not: ? Participate in activities in which you could fall or become injured. ? Drive. ? Use heavy machinery. ? Drink alcohol. ? Take sleeping pills or medicines that cause drowsiness. ? Make important decisions or sign legal documents. ? Take care of children on your own.  Rest. Eating and drinking  Follow the diet that is recommended by your health care provider.  If you vomit, drink water, juice, or soup when you can drink without vomiting.  Make sure you have little or no nausea before eating solid foods. General instructions  Have a responsible adult stay with you until you are awake and alert.  Take over-the-counter and prescription medicines only as told by your health care provider.  If you smoke, do not smoke without supervision.  Keep all follow-up visits as told by your health care provider. This is important. Contact a health  care provider if:  You keep feeling nauseous or you keep vomiting.  You feel light-headed.  You develop a rash.  You have a fever. Get help right away if:  You have trouble breathing. This information is not intended to replace advice given to you by your health care provider. Make sure you discuss any questions you have with your health  care provider. Document Released: 01/24/2016 Document Revised: 05/25/2016 Document Reviewed: 01/24/2016 Elsevier Interactive Patient Education  [employer].

## 2018-08-07 NOTE — Progress Notes (Signed)
[place], your liver numbers look completely normal!

## 2018-08-08 LAB — COMPREHENSIVE METABOLIC PANEL
ALBUMIN: 4.6 g/dL (ref 3.6–4.8)
ALK PHOS: 55 IU/L (ref 39–117)
ALT: 27 IU/L (ref 0–32)
AST: 25 IU/L (ref 0–40)
Albumin/Globulin Ratio: 1.9 (ref 1.2–2.2)
BUN / CREAT RATIO: 14 (ref 12–28)
BUN: 14 mg/dL (ref 8–27)
Bilirubin Total: <0.2 mg/dL (ref 0.0–1.2)
CO2: 21 mmol/L (ref 20–29)
Calcium: 9.6 mg/dL (ref 8.7–10.3)
Chloride: 101 mmol/L (ref 96–106)
Creatinine, Ser: 1 mg/dL (ref 0.57–1.00)
GFR calc non Af Amer: 61 mL/min/{1.73_m2} (ref >59)
GFR, EST AFRICAN AMERICAN: 70 mL/min/{1.73_m2} (ref >59)
GLUCOSE: 128 mg/dL — AB (ref 65–99)
Globulin, Total: 2.4 g/dL (ref 1.5–4.5)
Potassium: 3.8 mmol/L (ref 3.5–5.2)
Sodium: 144 mmol/L (ref 134–144)
TOTAL PROTEIN: 7 g/dL (ref 6.0–8.5)

## 2018-08-08 LAB — SPECIMEN STATUS REPORT

## 2018-08-13 ENCOUNTER — Encounter (HOSPITAL_COMMUNITY): Payer: Self-pay

## 2018-08-13 ENCOUNTER — Other Ambulatory Visit: Payer: Self-pay

## 2018-08-13 ENCOUNTER — Encounter (HOSPITAL_COMMUNITY)
Admission: RE | Admit: 2018-08-13 | Discharge: 2018-08-13 | Disposition: A | Discharge status: Home / Self Care | Payer: BLUE CROSS/BLUE SHIELD | Source: Ambulatory Visit | Attending: Internal Medicine | Admitting: Internal Medicine

## 2018-08-13 DIAGNOSIS — Z01810 Encounter for preprocedural cardiovascular examination: Secondary | ICD-10-CM | POA: Insufficient documentation

## 2018-08-13 HISTORY — DX: Gastro-esophageal reflux disease without esophagitis: K21.9

## 2018-08-16 ENCOUNTER — Ambulatory Visit (HOSPITAL_COMMUNITY): Payer: BLUE CROSS/BLUE SHIELD | Admitting: Anesthesiology

## 2018-08-16 ENCOUNTER — Encounter (HOSPITAL_COMMUNITY): Payer: Self-pay | Admitting: *Deleted

## 2018-08-16 ENCOUNTER — Ambulatory Visit (HOSPITAL_COMMUNITY)
Admission: RE | Admit: 2018-08-16 | Discharge: 2018-08-16 | Disposition: A | Discharge status: Home / Self Care | Payer: BLUE CROSS/BLUE SHIELD | Source: Ambulatory Visit | Attending: Internal Medicine | Admitting: Internal Medicine

## 2018-08-16 ENCOUNTER — Encounter (HOSPITAL_COMMUNITY)
Admission: RE | Disposition: A | Discharge status: Home / Self Care | Payer: Self-pay | Source: Ambulatory Visit | Attending: Internal Medicine

## 2018-08-16 ENCOUNTER — Telehealth: Payer: Self-pay | Admitting: Gastroenterology

## 2018-08-16 DIAGNOSIS — F329 Major depressive disorder, single episode, unspecified: Secondary | ICD-10-CM | POA: Diagnosis not present

## 2018-08-16 DIAGNOSIS — B199 Unspecified viral hepatitis without hepatic coma: Secondary | ICD-10-CM | POA: Insufficient documentation

## 2018-08-16 DIAGNOSIS — R1012 Left upper quadrant pain: Secondary | ICD-10-CM | POA: Diagnosis not present

## 2018-08-16 DIAGNOSIS — K219 Gastro-esophageal reflux disease without esophagitis: Secondary | ICD-10-CM | POA: Insufficient documentation

## 2018-08-16 DIAGNOSIS — Z87891 Personal history of nicotine dependence: Secondary | ICD-10-CM | POA: Diagnosis not present

## 2018-08-16 DIAGNOSIS — Z7951 Long term (current) use of inhaled steroids: Secondary | ICD-10-CM | POA: Diagnosis not present

## 2018-08-16 DIAGNOSIS — K7460 Unspecified cirrhosis of liver: Secondary | ICD-10-CM | POA: Diagnosis not present

## 2018-08-16 DIAGNOSIS — M069 Rheumatoid arthritis, unspecified: Secondary | ICD-10-CM | POA: Insufficient documentation

## 2018-08-16 DIAGNOSIS — F419 Anxiety disorder, unspecified: Secondary | ICD-10-CM | POA: Insufficient documentation

## 2018-08-16 DIAGNOSIS — E785 Hyperlipidemia, unspecified: Secondary | ICD-10-CM | POA: Diagnosis not present

## 2018-08-16 DIAGNOSIS — Z79899 Other long term (current) drug therapy: Secondary | ICD-10-CM | POA: Insufficient documentation

## 2018-08-16 DIAGNOSIS — E119 Type 2 diabetes mellitus without complications: Secondary | ICD-10-CM | POA: Insufficient documentation

## 2018-08-16 DIAGNOSIS — K317 Polyp of stomach and duodenum: Secondary | ICD-10-CM | POA: Diagnosis not present

## 2018-08-16 DIAGNOSIS — K7581 Nonalcoholic steatohepatitis (NASH): Secondary | ICD-10-CM | POA: Insufficient documentation

## 2018-08-16 DIAGNOSIS — Z7984 Long term (current) use of oral hypoglycemic drugs: Secondary | ICD-10-CM | POA: Insufficient documentation

## 2018-08-16 HISTORY — PX: ESOPHAGOGASTRODUODENOSCOPY (EGD) WITH PROPOFOL: SHX5813

## 2018-08-16 HISTORY — PX: BIOPSY: SHX5522

## 2018-08-16 LAB — GLUCOSE, CAPILLARY
GLUCOSE-CAPILLARY: 104 mg/dL — AB (ref 70–99)
Glucose-Capillary: 127 mg/dL — ABNORMAL HIGH (ref 70–99)

## 2018-08-16 SURGERY — ESOPHAGOGASTRODUODENOSCOPY (EGD) WITH PROPOFOL
Anesthesia: General

## 2018-08-16 MED ORDER — PROPOFOL 500 MG/50ML IV EMUL
INTRAVENOUS | Status: DC | PRN
  Administered 2018-08-16: 150 ug/kg/min via INTRAVENOUS

## 2018-08-16 MED ORDER — HYDROCODONE-ACETAMINOPHEN 7.5-325 MG PO TABS: 1.0000 | ORAL_TABLET | Freq: Once | ORAL | Status: DC | PRN

## 2018-08-16 MED ORDER — LACTATED RINGERS IV SOLN: INTRAVENOUS | Status: DC

## 2018-08-16 MED ORDER — PROPOFOL 10 MG/ML IV BOLUS
INTRAVENOUS | Status: DC | PRN
  Administered 2018-08-16: 40 mg via INTRAVENOUS

## 2018-08-16 MED ORDER — ONDANSETRON HCL 4 MG/2ML IJ SOLN
INTRAMUSCULAR | Status: DC | PRN
  Administered 2018-08-16: 4 mg via INTRAVENOUS

## 2018-08-16 MED ORDER — PROMETHAZINE HCL 25 MG/ML IJ SOLN: 6.2500 mg | INTRAMUSCULAR | Status: DC | PRN

## 2018-08-16 MED ORDER — LACTATED RINGERS IV SOLN
INTRAVENOUS | Status: DC | PRN
  Administered 2018-08-16: 08:00:00 via INTRAVENOUS

## 2018-08-16 MED ORDER — HYDROMORPHONE HCL 1 MG/ML IJ SOLN: 0.2500 mg | INTRAMUSCULAR | Status: DC | PRN

## 2018-08-16 MED ORDER — MIDAZOLAM HCL 2 MG/2ML IJ SOLN: 0.5000 mg | Freq: Once | INTRAMUSCULAR | Status: DC | PRN

## 2018-08-16 NOTE — Anesthesia Postprocedure Evaluation (Signed)
Anesthesia Post Note  Patient: Catherine Moore  Procedure(s) Performed: ESOPHAGOGASTRODUODENOSCOPY (EGD) WITH PROPOFOL (N/A ) BIOPSY  Patient location during evaluation: PACU Anesthesia Type: General Level of consciousness: awake and alert and patient cooperative Pain management: satisfactory to patient Vital Signs Assessment: post-procedure vital signs reviewed and stable Respiratory status: spontaneous breathing Cardiovascular status: stable Postop Assessment: no apparent nausea or vomiting Anesthetic complications: no     Last Vitals:  Vitals:   08/16/18 0721  BP: 137/71  Pulse: 72  Resp: 18  Temp: 37.1 C  SpO2: 94%    Last Pain:  Vitals:   08/16/18 0721  TempSrc: Oral  PainSc: 2                  Ernesta Trabert

## 2018-08-16 NOTE — Anesthesia Preprocedure Evaluation (Signed)
Anesthesia Evaluation  Patient identified by MRN, date of birth, ID band Patient awake  General Assessment Comment:Requests anti nausea meds   Reviewed: Allergy & Precautions, NPO status , Patient's Chart, lab work & pertinent test results  History of Anesthesia Complications (+) PONV  Airway Mallampati: II  TM Distance: >3 FB Neck ROM: Full    Dental no notable dental hx. (+) Teeth Intact   Pulmonary neg pulmonary ROS, former smoker,    Pulmonary exam normal breath sounds clear to auscultation       Cardiovascular Exercise Tolerance: Good negative cardio ROS Normal cardiovascular examI Rhythm:Regular Rate:Normal  Denies CP/MI or DOE   Neuro/Psych Anxiety Depression negative neurological ROS  negative psych ROS   GI/Hepatic GERD  Medicated and Controlled,(+) Hepatitis -Fatty liver -NASH   Endo/Other  negative endocrine ROSdiabetes, Well Controlled, Type 2  Renal/GU negative Renal ROS  negative genitourinary   Musculoskeletal  (+) Arthritis , Osteoarthritis,    Abdominal   Peds negative pediatric ROS (+)  Hematology negative hematology ROS (+)   Anesthesia Other Findings   Reproductive/Obstetrics negative OB ROS                             Anesthesia Physical Anesthesia Plan  ASA: II  Anesthesia Plan: General   Post-op Pain Management:    Induction: Intravenous  PONV Risk Score and Plan:   Airway Management Planned: Nasal Cannula and Simple Face Mask  Additional Equipment:   Intra-op Plan:   Post-operative Plan: Extubation in OR  Informed Consent: I have reviewed the patients History and Physical, chart, labs and discussed the procedure including the risks, benefits and alternatives for the proposed anesthesia with the patient or authorized representative who has indicated his/her understanding and acceptance.   Dental advisory given  Plan Discussed with:  CRNA  Anesthesia Plan Comments:         Anesthesia Quick Evaluation

## 2018-08-16 NOTE — Interval H&P Note (Signed)
History and Physical Interval Note:  08/16/2018 7:35 AM  Catherine Moore  has presented today for surgery, with the diagnosis of cirrhosis  The various methods of treatment have been discussed with the patient and family. After consideration of risks, benefits and other options for treatment, the patient has consented to  Procedure(s) with comments: ESOPHAGOGASTRODUODENOSCOPY (EGD) WITH PROPOFOL (N/A) - 8:00am as a surgical intervention .  The patient's history has been reviewed, patient examined, no change in status, stable for surgery.  I have reviewed the patient's chart and labs.  Questions were answered to the patient's satisfaction.     Manus Rudd   Patient states of left upper quadrant abdominal pain has resolved.  Recent ultrasound and labs resultsatient states left upper quadrant boas resolved. Recent labs, ultrasound reviewed.  EGD per plan today.e Labs, ultrasound revid. EGD per plday. The risks, benefits, limitations, alternatives and imponderables have been reviewed with the patient. Potential for esophageal dilation, biopsy, etc. have also been reviewed.  Questions have been answered. All parties agreeable.ew

## 2018-08-16 NOTE — Transfer of Care (Signed)
Immediate Anesthesia Transfer of Care Note  Patient: Catherine Moore  Procedure(s) Performed: ESOPHAGOGASTRODUODENOSCOPY (EGD) WITH PROPOFOL (N/A ) BIOPSY  Patient Location: PACU  Anesthesia Type:General  Level of Consciousness: awake, alert  and patient cooperative  Airway & Oxygen Therapy: Patient Spontanous Breathing  Post-op Assessment: Report given to RN and Post -op Vital signs reviewed and stable  Post vital signs: Reviewed and stable  Last Vitals:  Vitals Value Taken Time  BP    Temp    Pulse 70 08/16/2018  8:02 AM  Resp    SpO2 95 % 08/16/2018  8:02 AM  Vitals shown include unvalidated device data.  Last Pain:  Vitals:   08/16/18 0721  TempSrc: Oral  PainSc: 2       Patients Stated Pain Goal: 7 (68/03/21 2248)  Complications: No apparent anesthesia complications

## 2018-08-16 NOTE — Op Note (Signed)
[facility] Patient Name: Catherine Moore Procedure Date: 08/16/2018 7:26 AM MRN: [id number] Date of Birth: 1956-03-16 Attending MD: Norvel Richards , MD CSN: [id number] Age: 62 Admit Type: Outpatient Procedure:                Upper GI endoscopy Indications:              Abdominal pain in the left upper quadrant(resolved                            with management of constipation) Providers:                Norvel Richards, MD, Lurline Del, RN, Aram Candela Referring MD:              Medicines:                Propofol per Anesthesia Complications:            No immediate complications. Estimated Blood Loss:     Estimated blood loss was minimal. Procedure:                Pre-Anesthesia Assessment:                           - Prior to the procedure, a History and Physical                            was performed, and patient medications and                            allergies were reviewed. The patient's tolerance of                            previous anesthesia was also reviewed. The risks                            and benefits of the procedure and the sedation                            options and risks were discussed with the patient.                            All questions were answered, and informed consent                            was obtained. Prior Anticoagulants: The patient has                            taken no previous anticoagulant or antiplatelet                            agents. ASA Grade Assessment: II - A patient with  mild systemic disease. After reviewing the risks                            and benefits, the patient was deemed in                            satisfactory condition to undergo the procedure.                           After obtaining informed consent, the endoscope was                            passed under direct vision. Throughout the                            procedure, the  patient's blood pressure, pulse, and                            oxygen saturations were monitored continuously. The                            [id number]-H190 ([id number]) was introduced through the and                            advanced to the second part of duodenum. The upper                            GI endoscopy was accomplished without difficulty.                            The patient tolerated the procedure well. Scope In: 7:49:50 AM Scope Out: 7:53:46 AM Total Procedure Duration: 0 hours 3 minutes 56 seconds  Findings:      The examined esophagus was normal.      Multiple 4 mm pedunculated and sessile polyps were found in the entire       examined stomach. This was biopsied with a cold forceps for histology.       Estimated blood loss was minimal. Mucosa also biopsied for H. pylori       testing.      The exam was otherwise without abnormality.      The duodenal bulb and second portion of the duodenum were normal. Impression:               - Normal esophagus.                           - Multiple gastric polyps. Biopsied. Mucosa biopsy.                           - The examination was otherwise normal.                           - Normal duodenal bulb and second portion of the  duodenum. Moderate Sedation:      Moderate (conscious) sedation was personally administered by an       anesthesia professional. The following parameters were monitored: oxygen       saturation, heart rate, blood pressure, respiratory rate, EKG, adequacy       of pulmonary ventilation, and response to care. Total physician       intraservice time was 8 minutes. Recommendation:           - Patient has a contact number available for                            emergencies. The signs and symptoms of potential                            delayed complications were discussed with the                            patient. Return to normal activities tomorrow.                            Written  discharge instructions were provided to the                            patient.                           - Advance diet as tolerated.                           - Continue present medications.                           - Repeat upper endoscopy in 2 years for screening                            purposes.                           - Return to GI clinic (date not yet determined).                            Begin lactulose 15 cc orally daily. Procedure Code(s):        --- Professional ---                           [id number], Esophagogastroduodenoscopy, flexible,                            transoral; with biopsy, single or multiple Diagnosis Code(s):        --- Professional ---                           K31.7, Polyp of stomach and duodenum                           R10.12, Left upper quadrant pain CPT copyright 2018 American Medical Association.  All rights reserved. The codes documented in this report are preliminary and upon coder review may  be revised to meet current compliance requirements. Cristopher Estimable. Lilja Soland, MD Norvel Richards, MD 08/16/2018 8:06:57 AM This report has been signed electronically. Number of Addenda: 0

## 2018-08-16 NOTE — Anesthesia Procedure Notes (Signed)
Procedure Name: MAC Date/Time: 08/16/2018 7:42 AM Performed by: Vista Deck, CRNA Pre-anesthesia Checklist: Patient identified, Emergency Drugs available, Suction available, Timeout performed and Patient being monitored Patient Re-evaluated:Patient Re-evaluated prior to induction Oxygen Delivery Method: Nasal Cannula

## 2018-08-16 NOTE — Telephone Encounter (Signed)
Catherine Moore: is there a way we can ask the lab to add on platelet to the blood? It wasn't resulted, and I think it's because I didn't specify CBC with diff/platelets.   Thanks!

## 2018-08-16 NOTE — Telephone Encounter (Signed)
Spoke with AB. Test was from 2 weeks ago and lab cant add anything on. [place] with AB to not add platelets at this time.

## 2018-08-16 NOTE — Discharge Instructions (Signed)
Repeat EGD in 2 years  Begin lactulose syrup 15 cc daily to avoid constipation  Further recommendations to follow pending review of pathology report  Office visit when scheduled. EGD Discharge instructions Please read the instructions outlined below and refer to this sheet in the next few weeks. These discharge instructions provide you with general information on caring for yourself after you leave the hospital. Your doctor may also give you specific instructions. While your treatment has been planned according to the most current medical practices available, unavoidable complications occasionally occur. If you have any problems or questions after discharge, please call your doctor. ACTIVITY  You may resume your regular activity but move at a slower pace for the next 24 hours.   Take frequent rest periods for the next 24 hours.   Walking will help expel (get rid of) the air and reduce the bloated feeling in your abdomen.   No driving for 24 hours (because of the anesthesia (medicine) used during the test).   You may shower.   Do not sign any important legal documents or operate any machinery for 24 hours (because of the anesthesia used during the test).  NUTRITION  Drink plenty of fluids.   You may resume your normal diet.   Begin with a light meal and progress to your normal diet.   Avoid alcoholic beverages for 24 hours or as instructed by your caregiver.  MEDICATIONS  You may resume your normal medications unless your caregiver tells you otherwise.  WHAT YOU CAN EXPECT TODAY  You may experience abdominal discomfort such as a feeling of fullness or gas pains.  FOLLOW-UP  Your doctor will discuss the results of your test with you.  SEEK IMMEDIATE MEDICAL ATTENTION IF ANY OF THE FOLLOWING OCCUR:  Excessive nausea (feeling sick to your stomach) and/or vomiting.   Severe abdominal pain and distention (swelling).   Trouble swallowing.   Temperature over 101 F (37.8  C).   Rectal bleeding or vomiting of blood.      PATIENT INSTRUCTIONS POST-ANESTHESIA  IMMEDIATELY FOLLOWING SURGERY:  Do not drive or operate machinery for the first twenty four hours after surgery.  Do not make any important decisions for twenty four hours after surgery or while taking narcotic pain medications or sedatives.  If you develop intractable nausea and vomiting or a severe headache please notify your doctor immediately.  FOLLOW-UP:  Please make an appointment with your surgeon as instructed. You do not need to follow up with anesthesia unless specifically instructed to do so.  WOUND CARE INSTRUCTIONS (if applicable):  Keep a dry clean dressing on the anesthesia/puncture wound site if there is drainage.  Once the wound has quit draining you may leave it open to air.  Generally you should leave the bandage intact for twenty four hours unless there is drainage.  If the epidural site drains for more than 36-48 hours please call the anesthesia department.  QUESTIONS?:  Please feel free to call your physician or the hospital operator if you have any questions, and they will be happy to assist you.

## 2018-08-17 ENCOUNTER — Encounter: Payer: Self-pay | Admitting: Internal Medicine

## 2018-08-21 ENCOUNTER — Encounter (HOSPITAL_COMMUNITY): Payer: Self-pay | Admitting: Internal Medicine

## 2018-09-03 ENCOUNTER — Other Ambulatory Visit: Payer: Self-pay | Admitting: *Deleted

## 2018-09-03 MED ORDER — FLUTICASONE PROPIONATE 50 MCG/ACT NA SUSP: 2.0000 | Freq: Every day | NASAL | 3 refills | Status: DC

## 2018-09-08 ENCOUNTER — Other Ambulatory Visit: Payer: Self-pay | Admitting: Internal Medicine

## 2018-09-30 ENCOUNTER — Other Ambulatory Visit: Payer: Self-pay | Admitting: Family Medicine

## 2018-10-01 ENCOUNTER — Other Ambulatory Visit: Payer: Self-pay

## 2018-10-01 MED ORDER — ROSUVASTATIN CALCIUM 10 MG PO TABS: 10.0000 mg | ORAL_TABLET | Freq: Every day | ORAL | 2 refills | Status: DC

## 2018-10-03 ENCOUNTER — Ambulatory Visit: Payer: BLUE CROSS/BLUE SHIELD | Admitting: Family Medicine

## 2018-10-03 ENCOUNTER — Encounter: Payer: Self-pay | Admitting: Family Medicine

## 2018-10-03 VITALS — BP 138/80 | Ht 66.0 in | Wt 196.0 lb

## 2018-10-03 DIAGNOSIS — F419 Anxiety disorder, unspecified: Secondary | ICD-10-CM

## 2018-10-03 DIAGNOSIS — Z79891 Long term (current) use of opiate analgesic: Secondary | ICD-10-CM | POA: Diagnosis not present

## 2018-10-03 DIAGNOSIS — R300 Dysuria: Secondary | ICD-10-CM | POA: Diagnosis not present

## 2018-10-03 DIAGNOSIS — M2550 Pain in unspecified joint: Secondary | ICD-10-CM

## 2018-10-03 DIAGNOSIS — E7800 Pure hypercholesterolemia, unspecified: Secondary | ICD-10-CM | POA: Diagnosis not present

## 2018-10-03 DIAGNOSIS — E119 Type 2 diabetes mellitus without complications: Secondary | ICD-10-CM

## 2018-10-03 DIAGNOSIS — F32A Depression, unspecified: Secondary | ICD-10-CM

## 2018-10-03 DIAGNOSIS — N3000 Acute cystitis without hematuria: Secondary | ICD-10-CM

## 2018-10-03 DIAGNOSIS — F329 Major depressive disorder, single episode, unspecified: Secondary | ICD-10-CM

## 2018-10-03 LAB — POCT URINALYSIS DIPSTICK
RBC UA: NEGATIVE
pH, UA: 5 (ref 5.0–8.0)

## 2018-10-03 MED ORDER — HYDROCODONE-ACETAMINOPHEN 10-325 MG PO TABS: 1.0000 | ORAL_TABLET | Freq: Four times a day (QID) | ORAL | 0 refills | Status: DC | PRN

## 2018-10-03 MED ORDER — METFORMIN HCL 500 MG PO TABS: 500.0000 mg | ORAL_TABLET | Freq: Two times a day (BID) | ORAL | 5 refills | Status: DC

## 2018-10-03 MED ORDER — DULOXETINE HCL 60 MG PO CPEP: 60.0000 mg | ORAL_CAPSULE | Freq: Every day | ORAL | 5 refills | Status: DC

## 2018-10-03 MED ORDER — ALPRAZOLAM 1 MG PO TABS: ORAL_TABLET | ORAL | 5 refills | Status: DC

## 2018-10-03 MED ORDER — NITROFURANTOIN MONOHYD MACRO 100 MG PO CAPS: 100.0000 mg | ORAL_CAPSULE | Freq: Two times a day (BID) | ORAL | 0 refills | Status: AC

## 2018-10-03 MED ORDER — GLIPIZIDE ER 5 MG PO TB24: ORAL_TABLET | ORAL | 5 refills | Status: DC

## 2018-10-03 NOTE — Progress Notes (Signed)
Subjective:    Patient ID: Catherine Moore, female    DOB: 09/08/1956, 62 y.o.   MRN: [id number]  HPI This patient was seen today for chronic pain. Taking for chronic arthritis pain mostly to back, hands and knees. Reports pain medication allows her to function better throughout the day.  The medication list was reviewed and updated.   -Compliance with medication: Yes   - Number patient states they take daily: 3 per day  -when was the last dose patient took? Last night at 10 pm.  The patient was advised the importance of maintaining medication and not using illegal substances with these.  Here for refills and follow up  The patient was educated that we can provide 3 monthly scripts for their medication, it is their responsibility to follow the instructions.  Side effects or complications from medications: Reports drowsiness occasionally, does not drive when she feels this way.   Patient is aware that pain medications are meant to minimize the severity of the pain to allow their pain levels to improve to allow for better function. They are aware of that pain medications cannot totally remove their pain.  Due for UDT ( at least once per year) : 06/2019  Patient also is complaining of burning when urinating and feels like she has to go but only urinates several drops. Reports frequency. Symptoms x 2 weeks. No fever, N/V, flank pain. Denies hematuria. Denies hx of frequent UTIs.   Also wanted to mention her hot flashes.( Advised that we may have to make a separate appointment for the hot flashes.)  Reports taking xanax 1/2 tablet in the mornings and 1 - 1 1/2 tab in the evenings for anxiety. Reports does not take with her pain meds. Reports is effective for her.  Taking cymbalta daily - reports mood is stable. Denies SI/HI  Reports compliance with diabetes medications, typically fasting blood sugars 105-110. Denies hypoglycemia episodes. No adverse effects.      Review of Systems    Constitutional: Negative for chills and fever.  Respiratory: Negative for shortness of breath.   Cardiovascular: Negative for chest pain.  Gastrointestinal: Negative for abdominal pain, nausea and vomiting.  Genitourinary: Positive for difficulty urinating, dysuria and frequency. Negative for flank pain and hematuria.  Psychiatric/Behavioral: Negative for suicidal ideas.       Objective:   Physical Exam Vitals signs and nursing note reviewed.  Constitutional:      General: She is not in acute distress.    Appearance: Normal appearance. She is well-developed.  HENT:     Head: Normocephalic and atraumatic.  Neck:     Musculoskeletal: Neck supple.  Cardiovascular:     Rate and Rhythm: Normal rate and regular rhythm.     Heart sounds: Normal heart sounds. No murmur.  Pulmonary:     Effort: Pulmonary effort is normal. No respiratory distress.     Breath sounds: Normal breath sounds.  Abdominal:     Tenderness: There is no right CVA tenderness or left CVA tenderness.  Skin:    General: Skin is warm and dry.  Neurological:     Mental Status: She is alert and oriented to person, place, and time.     Results for orders placed or performed in visit on 10/03/18  POCT urinalysis dipstick  Result Value Ref Range   Color, UA     Clarity, UA     Glucose, UA     Bilirubin, UA     Ketones, UA  Spec Grav, UA >=1.030 (A) 1.010 - 1.025   Blood, UA Negative    pH, UA 5.0 5.0 - 8.0   Protein, UA     Urobilinogen, UA     Nitrite, UA     Leukocytes, UA Moderate (2+) (A) Negative   Appearance     Odor     Microscopic exam of the urine with clusters of WBCs noted, no RBCs noted.    Assessment & Plan:  1. Encounter for long-term opiate analgesic use The patient was seen in followup for chronic pain. A review over at their current pain status was discussed. Drug registry was checked. Prescriptions were given. Discussion was held regarding the importance of compliance with  medication as well as pain medication contract.  Time for questions regarding pain management plan occurred. Importance of regular followup visits was discussed. Patient was informed that medication may cause drowsiness and should not be combined  with other medications/alcohol or street drugs. Patient was cautioned that medication could cause drowsiness. If the patient feels medication is causing altered alertness then do not drive or operate dangerous equipment.  2. Arthralgia, unspecified joint Chronic pain management for arthritis as discussed above in #1.  3. Acute cystitis without hematuria UTI as evidenced by exam of the urine, will treat with macrobid x 5 days. Will culture urine and notify pt of results. Warning signs discussed, f/u if symptoms worsen or fail to improve.  4. Dysuria - Plan: POCT urinalysis dipstick, Urine Culture See #3  5. Pure hypercholesterolemia - Plan: Lipid panel Doing well on crestor, will continue. Will check lipid panel. F/u in 6 months.   6. Type 2 diabetes mellitus without complication, without long-term current use of insulin ([place]) - Plan: Hemoglobin A1c Doing well on current medications, will check A1c with lab work. F/u in 6 months.  7. Anxiety and depression Doing well on current medications, not suicidal, mood is stable would like to continue. Refills given. PMP database checked.  Dr. Mickie Hillier was consulted on this case and is in agreement with the above treatment plan.

## 2018-10-05 ENCOUNTER — Other Ambulatory Visit: Payer: Self-pay | Admitting: Family Medicine

## 2018-10-05 LAB — URINE CULTURE

## 2018-10-05 LAB — SPECIMEN STATUS REPORT

## 2018-10-05 MED ORDER — CIPROFLOXACIN HCL 250 MG PO TABS: ORAL_TABLET | ORAL | 0 refills | Status: DC

## 2018-12-28 ENCOUNTER — Other Ambulatory Visit: Payer: Self-pay | Admitting: Family Medicine

## 2018-12-31 ENCOUNTER — Encounter: Payer: Self-pay | Admitting: Family Medicine

## 2018-12-31 ENCOUNTER — Ambulatory Visit: Payer: BLUE CROSS/BLUE SHIELD | Admitting: Family Medicine

## 2018-12-31 ENCOUNTER — Other Ambulatory Visit: Payer: Self-pay

## 2018-12-31 VITALS — BP 120/86 | Ht 66.0 in | Wt 200.0 lb

## 2018-12-31 DIAGNOSIS — E119 Type 2 diabetes mellitus without complications: Secondary | ICD-10-CM | POA: Diagnosis not present

## 2018-12-31 DIAGNOSIS — Z79891 Long term (current) use of opiate analgesic: Secondary | ICD-10-CM | POA: Diagnosis not present

## 2018-12-31 MED ORDER — HYDROCODONE-ACETAMINOPHEN 10-325 MG PO TABS: 1.0000 | ORAL_TABLET | Freq: Four times a day (QID) | ORAL | 0 refills | Status: DC | PRN

## 2018-12-31 MED ORDER — PHENTERMINE HCL 37.5 MG PO CAPS: 37.5000 mg | ORAL_CAPSULE | ORAL | 2 refills | Status: DC

## 2018-12-31 MED ORDER — DULOXETINE HCL 60 MG PO CPEP: 60.0000 mg | ORAL_CAPSULE | Freq: Every day | ORAL | 5 refills | Status: DC

## 2018-12-31 MED ORDER — CETIRIZINE HCL 10 MG PO TABS: ORAL_TABLET | ORAL | 5 refills | Status: DC

## 2018-12-31 MED ORDER — METFORMIN HCL 500 MG PO TABS: 500.0000 mg | ORAL_TABLET | Freq: Two times a day (BID) | ORAL | 5 refills | Status: DC

## 2018-12-31 MED ORDER — GLIPIZIDE ER 5 MG PO TB24: ORAL_TABLET | ORAL | 5 refills | Status: DC

## 2018-12-31 NOTE — Progress Notes (Signed)
   Subjective:    Patient ID: Catherine Moore, female    DOB: April 19, 1956, 63 y.o.   MRN: [id number]  HPI Patient is here today to follow up on her chronic health issues and pain management.  Gerd: Takes Prilosec 20 mg once per day.  Hyperlipidemia:Rosuvastin 10 mg once per day.  Anxiety and depression xanax 1 mg one bid,Cymbalta 60 mg once per day.  This patient was seen today for chronic pain  The medication list was reviewed and updated.   -Compliance with medication: Yes  - Number patient states they take daily: 3 per day  -when was the last dose patient took? Last night  The patient was advised the importance of maintaining medication and not using illegal substances with these.  Here for refills and follow up  The patient was educated that we can provide 3 monthly scripts for their medication, it is their responsibility to follow the instructions.  Side effects or complications from medications: None  Patient is aware that pain medications are meant to minimize the severity of the pain to allow their pain levels to improve to allow for better function. They are aware of that pain medications cannot totally remove their pain.  Due for UDT ( at least once per year) : 07/05/2019    Overall  Things are doing ok       Review of Systems No headache, no major weight loss or weight gain, no chest pain no back pain abdominal pain no change in bowel habits complete ROS otherwise negative     Objective:   Physical Exam  Alert and oriented, vitals reviewed and stable, NAD ENT-TM's and ext canals WNL bilat via otoscopic exam Soft palate, tonsils and post pharynx WNL via oropharyngeal exam Neck-symmetric, no masses; thyroid nonpalpable and nontender Pulmonary-no tachypnea or accessory muscle use; Clear without wheezes via auscultation Card--no abnrml murmurs, rhythm reg and rate WNL Carotid pulses symmetric, without bruits       Assessment & Plan:  Impression  chronic pain.  Discussed  Impression: Chronic pain. Patient compliant with medication. No substantial side effects. [place] controlled substance registry reviewed to ensure compliance and proper use of medication. Patient aware goal of medicine is not complete resolution of pain but to control his symptoms to improve his functional capacity. Aware of potential adverse side effects  #2 chronic depression/anxiety clinically stable reviewed to maintain same meds  3.  Hyperlipidemia recent blood work.  Reviewed discussed  4.  Morbid obesity.  Patient requests phentermine will a lot 3 months for

## 2019-01-23 ENCOUNTER — Ambulatory Visit: Payer: BLUE CROSS/BLUE SHIELD | Admitting: Nurse Practitioner

## 2019-02-07 ENCOUNTER — Telehealth: Payer: Self-pay | Admitting: Family Medicine

## 2019-02-07 MED ORDER — BLOOD GLUCOSE METER KIT: PACK | 0 refills | Status: DC

## 2019-02-07 NOTE — Telephone Encounter (Signed)
Script printed awaiting signature.

## 2019-02-07 NOTE — Telephone Encounter (Signed)
Patient is requesting a prescription for a libra diabetic machine called into [facility].

## 2019-02-08 NOTE — Telephone Encounter (Signed)
Prescription was faxed to pharmacy 02/07/19

## 2019-02-11 ENCOUNTER — Other Ambulatory Visit: Payer: Self-pay

## 2019-02-11 ENCOUNTER — Telehealth: Payer: Self-pay | Admitting: Family Medicine

## 2019-02-11 MED ORDER — BLOOD GLUCOSE METER KIT: PACK | 0 refills | Status: DC

## 2019-02-11 NOTE — Telephone Encounter (Signed)
Script printed awaiting signature

## 2019-02-11 NOTE — Telephone Encounter (Signed)
Pt is calling today insurance will not cover the libra diabetic machine.   She needs the Dexcon G6 sensor called in to [id number] [place], [place].

## 2019-02-12 NOTE — Telephone Encounter (Signed)
Script for blood glucose meter and supplies faxed to pharmacy. Left message to return call.

## 2019-02-12 NOTE — Telephone Encounter (Signed)
Pt returned call. Informed her we faxed the pharmacy this morning.

## 2019-02-19 ENCOUNTER — Telehealth: Payer: Self-pay | Admitting: Family Medicine

## 2019-02-19 NOTE — Telephone Encounter (Signed)
Fax from [facility] regarding Dexcom G6 monitor. Please sign bottom of form. Form in provider office. Please advise. Thank you

## 2019-02-19 NOTE — Telephone Encounter (Signed)
done

## 2019-02-19 NOTE — Telephone Encounter (Signed)
Pt calling to inquire about prior auth for Dexcom G6  States she contacted her insurance [place] states it can be covered with prior auth  Form in prior auth basket at nurses station  Pt states she's been trying to get this done for a couple weeks, please advise & call pt when done  Please fax approval information to [facility]

## 2019-02-20 NOTE — Telephone Encounter (Signed)
Form faxed to [facility]; pt aware and verbalized understanding.

## 2019-02-26 NOTE — Telephone Encounter (Signed)
Fax from [facility] stating that [place] sensor denied due to patient not using insulin for diabetes. Left message to return call

## 2019-02-26 NOTE — Telephone Encounter (Signed)
Pt returned call and verbalized understanding.

## 2019-03-25 DIAGNOSIS — E119 Type 2 diabetes mellitus without complications: Secondary | ICD-10-CM | POA: Diagnosis not present

## 2019-03-25 DIAGNOSIS — E7800 Pure hypercholesterolemia, unspecified: Secondary | ICD-10-CM | POA: Diagnosis not present

## 2019-03-26 LAB — HEMOGLOBIN A1C
Est. average glucose Bld gHb Est-mCnc: 148 mg/dL
Hgb A1c MFr Bld: 6.8 % — ABNORMAL HIGH (ref 4.8–5.6)

## 2019-03-26 LAB — LIPID PANEL
CHOL/HDL RATIO: 3.3 ratio (ref 0.0–4.4)
Cholesterol, Total: 132 mg/dL (ref 100–199)
HDL: 40 mg/dL (ref >39)
LDL CALC: 65 mg/dL (ref 0–99)
TRIGLYCERIDES: 133 mg/dL (ref 0–149)
VLDL Cholesterol Cal: 27 mg/dL (ref 5–40)

## 2019-03-28 ENCOUNTER — Other Ambulatory Visit: Payer: Self-pay | Admitting: Family Medicine

## 2019-03-31 ENCOUNTER — Other Ambulatory Visit: Payer: Self-pay | Admitting: Family Medicine

## 2019-04-01 NOTE — Telephone Encounter (Signed)
Three mo ok

## 2019-04-02 ENCOUNTER — Other Ambulatory Visit: Payer: Self-pay | Admitting: *Deleted

## 2019-04-02 ENCOUNTER — Ambulatory Visit: Payer: BLUE CROSS/BLUE SHIELD | Admitting: Family Medicine

## 2019-04-02 ENCOUNTER — Ambulatory Visit (INDEPENDENT_AMBULATORY_CARE_PROVIDER_SITE_OTHER): Payer: BC Managed Care – PPO | Admitting: Family Medicine

## 2019-04-02 ENCOUNTER — Telehealth: Payer: Self-pay | Admitting: Family Medicine

## 2019-04-02 ENCOUNTER — Other Ambulatory Visit: Payer: Self-pay

## 2019-04-02 VITALS — BP 128/84 | Temp 98.4°F | Ht 66.0 in | Wt 200.2 lb

## 2019-04-02 DIAGNOSIS — Z79891 Long term (current) use of opiate analgesic: Secondary | ICD-10-CM

## 2019-04-02 DIAGNOSIS — M1712 Unilateral primary osteoarthritis, left knee: Secondary | ICD-10-CM

## 2019-04-02 MED ORDER — HYDROCODONE-ACETAMINOPHEN 10-325 MG PO TABS: 1.0000 | ORAL_TABLET | Freq: Four times a day (QID) | ORAL | 0 refills | Status: DC | PRN

## 2019-04-02 MED ORDER — ALPRAZOLAM 1 MG PO TABS: ORAL_TABLET | ORAL | 5 refills | Status: DC

## 2019-04-02 MED ORDER — PHENTERMINE HCL 37.5 MG PO CAPS: 37.5000 mg | ORAL_CAPSULE | ORAL | 2 refills | Status: DC

## 2019-04-02 NOTE — Progress Notes (Signed)
   Subjective:    Patient ID: Catherine Moore, female    DOB: 09-22-1956, 63 y.o.   MRN: [id number]  HPI  This patient was seen today for chronic pain  The medication list was reviewed and updated.   -Compliance with medication: yes  - Number patient states they take daily: 3  -when was the last dose patient took? today  The patient was advised the importance of maintaining medication and not using illegal substances with these.  Here for refills and follow up  The patient was educated that we can provide 3 monthly scripts for their medication, it is their responsibility to follow the instructions.  Side effects or complications from medications: none  Patient is aware that pain medications are meant to minimize the severity of the pain to allow their pain levels to improve to allow for better function. They are aware of that pain medications cannot totally remove their pain.   Patient continues to take lipid medication regularly. No obvious side effects from it. Generally does not miss a dose. Prior blood work results are reviewed with patient. Patient continues to work on fat intake in diet  Takes chol med faithfully        Review of Systems No headache, no major weight loss or weight gain, no chest pain no back pain abdominal pain no change in bowel habits complete ROS otherwise negative     Objective:   Physical Exam  Virtual      Assessment & Plan:  Impression: Chronic pain. Patient compliant with medication. No substantial side effects. [place] controlled substance registry reviewed to ensure compliance and proper use of medication. Patient aware goal of medicine is not complete resolution of pain but to control his symptoms to improve his functional capacity. Aware of potential adverse side effects  Hyperlipidemia.  Compliance discussed medications discussed

## 2019-04-02 NOTE — Telephone Encounter (Signed)
Pt is checking on her medications being called in to pharmacy. She had a OV today and will be out of her hydrocodone tonight

## 2019-04-13 ENCOUNTER — Encounter: Payer: Self-pay | Admitting: Family Medicine

## 2019-05-18 IMAGING — US US ABDOMEN LIMITED
1 series · 14 of 25 positions shown · non-contrast
Comparison: 03/07/2017

CLINICAL DATA: Cirrhosis

EXAM:
ULTRASOUND ABDOMEN LIMITED RIGHT UPPER QUADRANT

[Series 1: us abdomen limited · 0.21mm/px · 14 of 91 slices shown]
[im 1/91]
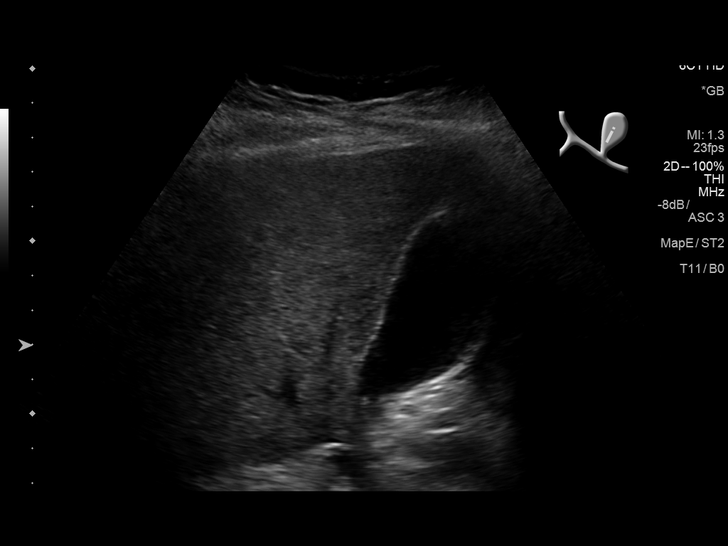
[im 8/91]
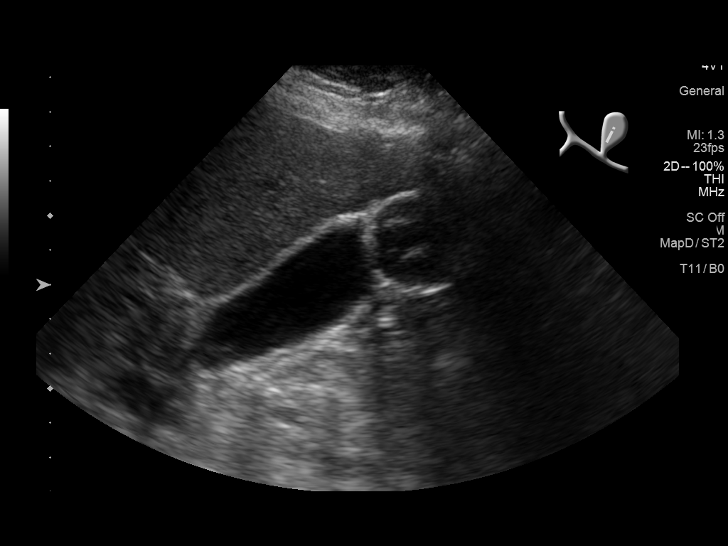
[im 16/91]
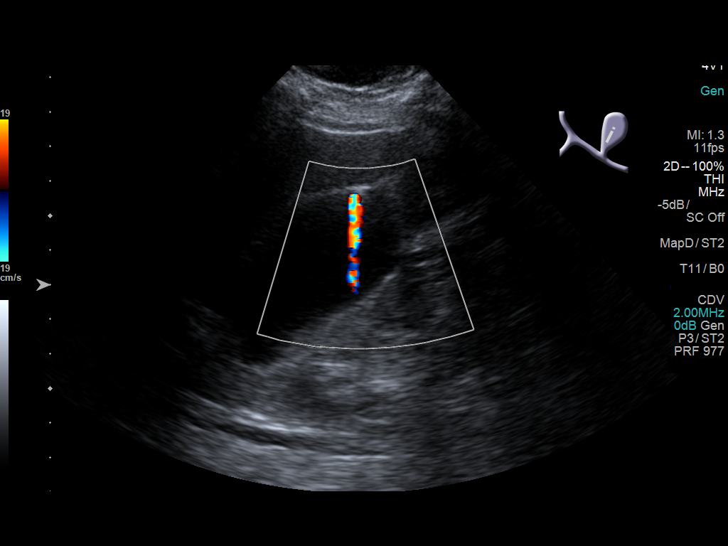
[im 23/91]
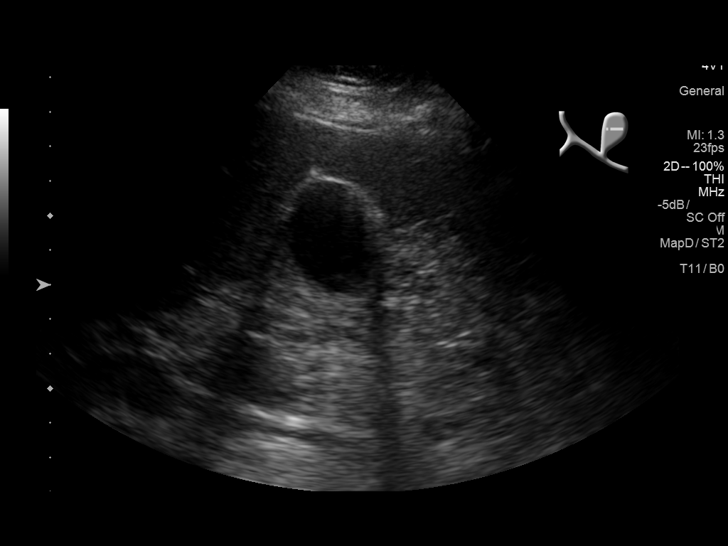
[im 31/91]
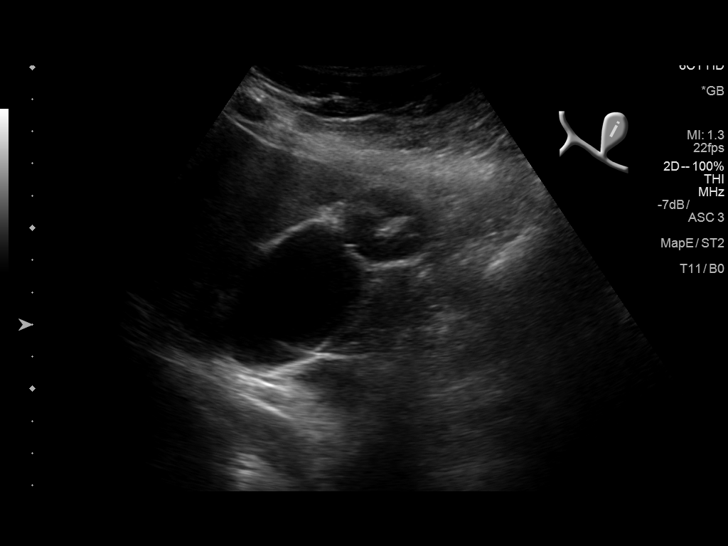
[im 34/91]
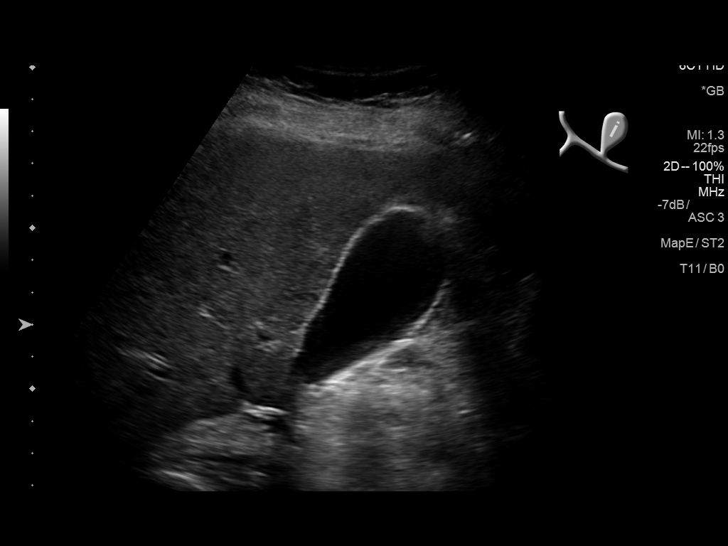
[im 42/91]
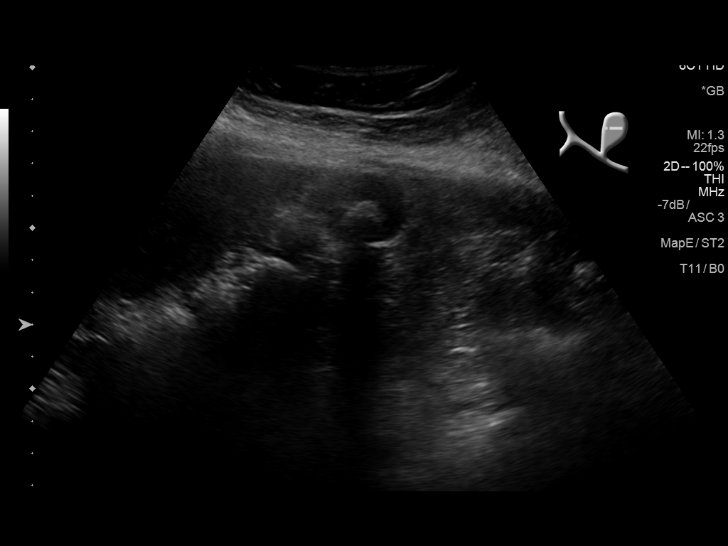
[im 49/91]
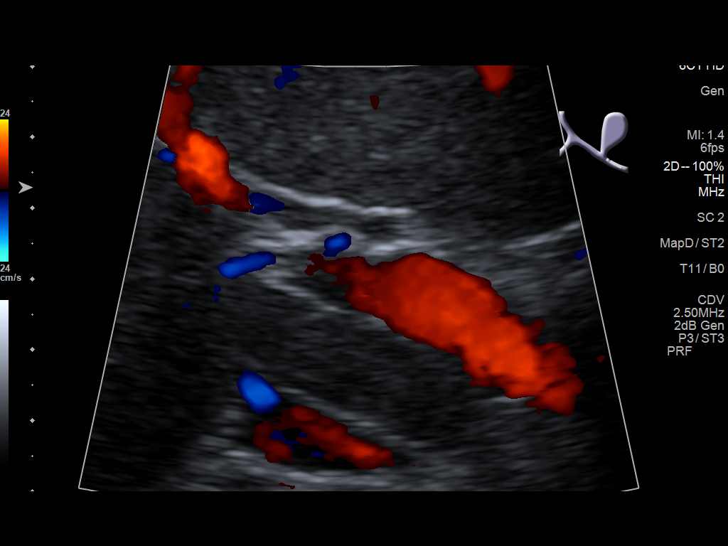
[im 57/91]
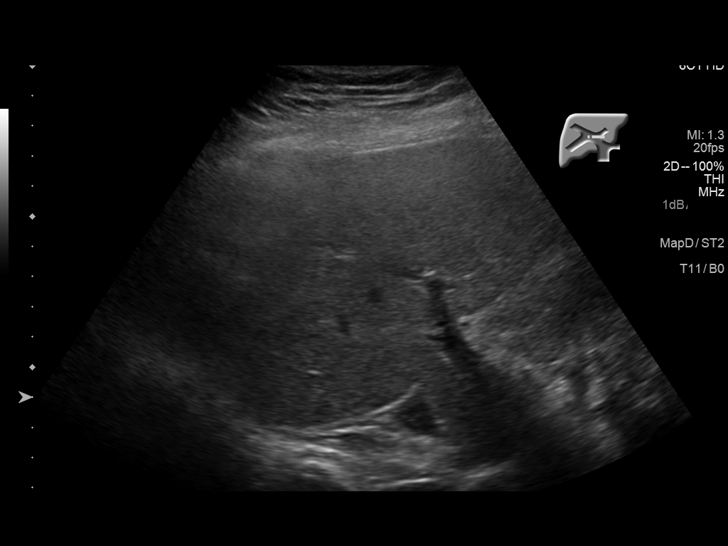
[im 61/91]
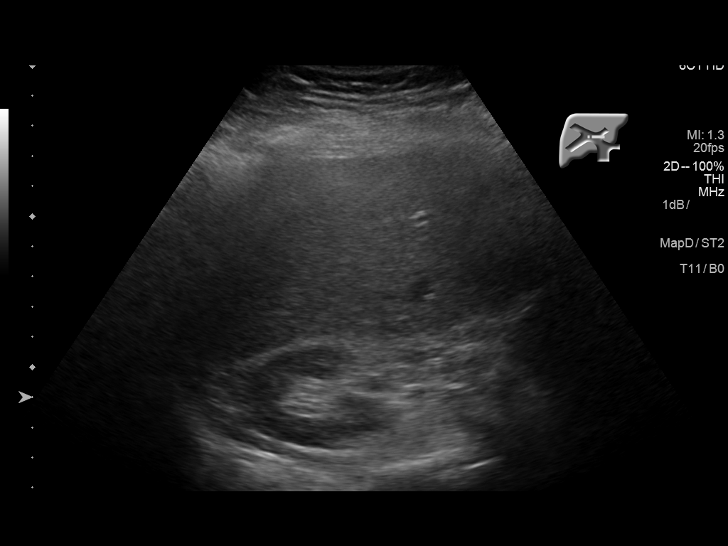
[im 68/91]
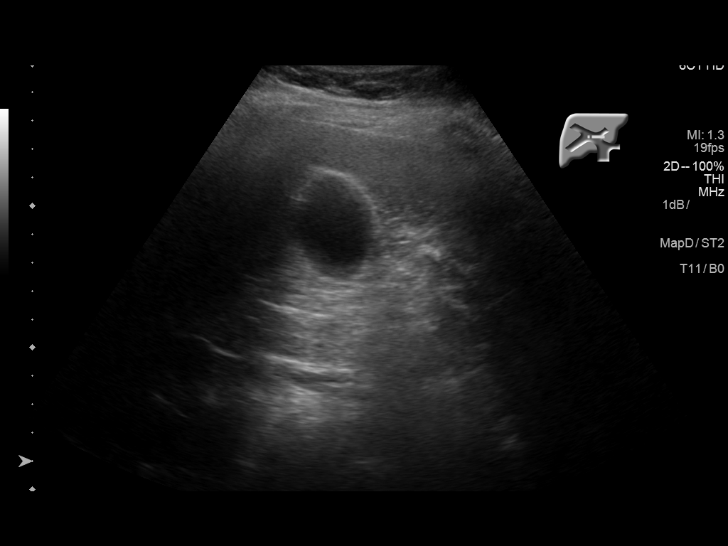
[im 76/91]
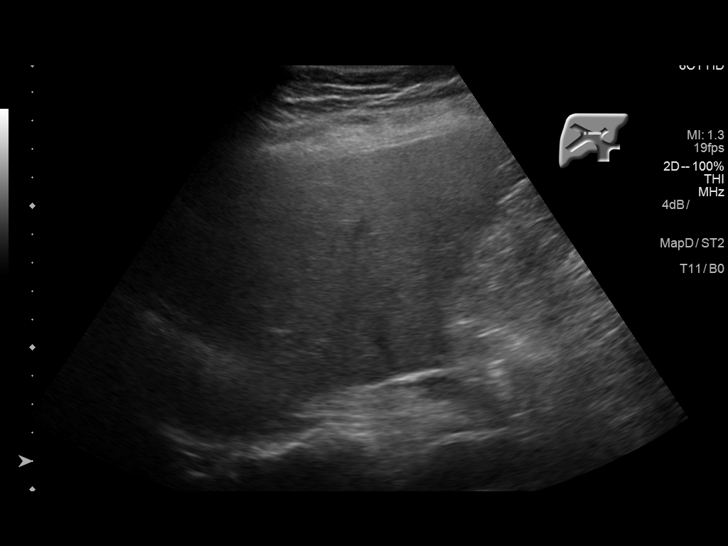
[im 83/91]
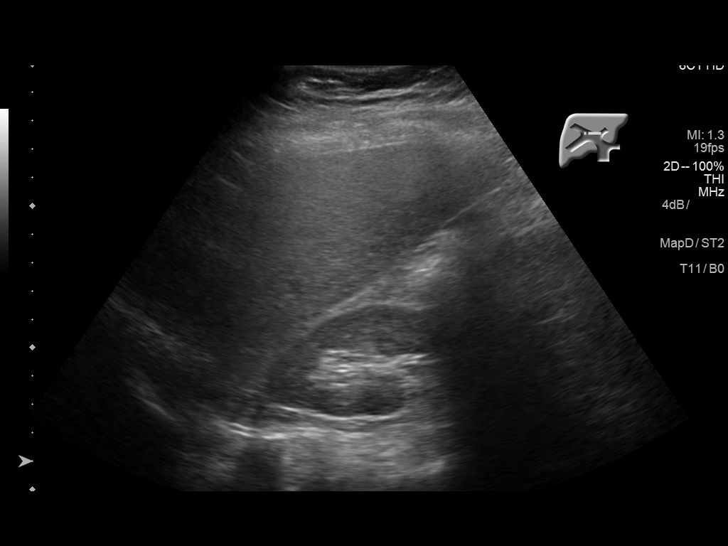
[im 91/91]
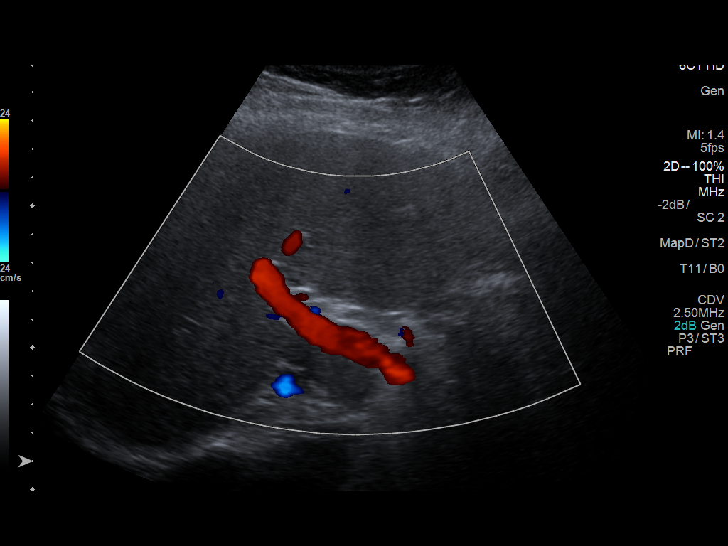

[14 of 25 positions shown; findings below may reference images not displayed]

FINDINGS: Gallbladder:

Gallbladder is well distended with evidence of cholelithiasis. No
wall thickening or pericholecystic fluid is noted.

Common bile duct:

Diameter: 2.6 mm.

Liver:

Mild increased echogenicity consistent with fatty infiltration. No
focal mass is noted. Portal vein is patent on color Doppler imaging
with normal direction of blood flow towards the liver.
IMPRESSION: Cholelithiasis without complicating factors.

Fatty liver.

## 2019-06-26 ENCOUNTER — Other Ambulatory Visit: Payer: Self-pay

## 2019-06-26 ENCOUNTER — Other Ambulatory Visit: Payer: Self-pay | Admitting: Family Medicine

## 2019-06-26 ENCOUNTER — Encounter: Payer: Self-pay | Admitting: Family Medicine

## 2019-06-26 ENCOUNTER — Ambulatory Visit (INDEPENDENT_AMBULATORY_CARE_PROVIDER_SITE_OTHER): Payer: BC Managed Care – PPO | Admitting: Family Medicine

## 2019-06-26 DIAGNOSIS — F329 Major depressive disorder, single episode, unspecified: Secondary | ICD-10-CM

## 2019-06-26 DIAGNOSIS — F419 Anxiety disorder, unspecified: Secondary | ICD-10-CM | POA: Diagnosis not present

## 2019-06-26 DIAGNOSIS — E7800 Pure hypercholesterolemia, unspecified: Secondary | ICD-10-CM

## 2019-06-26 DIAGNOSIS — Z79891 Long term (current) use of opiate analgesic: Secondary | ICD-10-CM

## 2019-06-26 DIAGNOSIS — E119 Type 2 diabetes mellitus without complications: Secondary | ICD-10-CM

## 2019-06-26 DIAGNOSIS — F32A Depression, unspecified: Secondary | ICD-10-CM

## 2019-06-26 MED ORDER — HYDROCODONE-ACETAMINOPHEN 10-325 MG PO TABS: 1.0000 | ORAL_TABLET | Freq: Four times a day (QID) | ORAL | 0 refills | Status: DC | PRN

## 2019-06-26 MED ORDER — ALPRAZOLAM 1 MG PO TABS: ORAL_TABLET | ORAL | 5 refills | Status: DC

## 2019-06-26 MED ORDER — GLIPIZIDE ER 5 MG PO TB24: ORAL_TABLET | ORAL | 5 refills | Status: DC

## 2019-06-26 MED ORDER — OMEPRAZOLE 20 MG PO CPDR: 20.0000 mg | DELAYED_RELEASE_CAPSULE | Freq: Every day | ORAL | 5 refills | Status: DC

## 2019-06-26 MED ORDER — METFORMIN HCL 500 MG PO TABS: 500.0000 mg | ORAL_TABLET | Freq: Two times a day (BID) | ORAL | 5 refills | Status: DC

## 2019-06-26 MED ORDER — ROSUVASTATIN CALCIUM 10 MG PO TABS: 10.0000 mg | ORAL_TABLET | Freq: Every day | ORAL | 5 refills | Status: DC

## 2019-06-26 MED ORDER — DULOXETINE HCL 60 MG PO CPEP: 60.0000 mg | ORAL_CAPSULE | Freq: Every day | ORAL | 5 refills | Status: DC

## 2019-06-26 MED ORDER — PHENTERMINE HCL 37.5 MG PO CAPS: 37.5000 mg | ORAL_CAPSULE | ORAL | 2 refills | Status: DC

## 2019-06-26 NOTE — Progress Notes (Signed)
Subjective:  Audio only  Patient concepts today with numerous concerns  Patient ID: Catherine Moore, female    DOB: [birth year], 63 y.o.   MRN: [id number]  HPI  This patient was seen today for chronic pain  The medication list was reviewed and updated.   -Compliance with medication: yes  - Number patient states they take daily: 3  -when was the last dose patient took? today  The patient was advised the importance of maintaining medication and not using illegal substances with these.  Here for refills and follow up  The patient was educated that we can provide 3 monthly scripts for their medication, it is their responsibility to follow the instructions.  Side effects or complications from medications: none  Patient is aware that pain medications are meant to minimize the severity of the pain to allow their pain levels to improve to allow for better function. They are aware of that pain medications cannot totally remove their pain.   Virtual Visit via Video Note  I connected with Catherine Moore on 06/26/19 at  9:00 AM EDT by a video enabled telemedicine application and verified that I am speaking with the correct person using two identifiers.  Location: Patient: home Provider: office   I discussed the limitations of evaluation and management by telemedicine and the availability of in person appointments. The patient expressed understanding and agreed to proceed.  History of Present Illness:    Observations/Objective:   Assessment and Plan:   Follow Up Instructions:    I discussed the assessment and treatment plan with the patient. The patient was provided an opportunity to ask questions and all were answered. The patient agreed with the plan and demonstrated an understanding of the instructions.   The patient was advised to call back or seek an in-person evaluation if the symptoms worsen or if the condition fails to improve as anticipated.  I provided 25 minutes of  non-face-to-face time during this encounter.  Patient compliant with pain medication. Continues to experience the pain which led to initiation of analgesic intervention. No significant negative side effects. States definitely needs the pain medication to maintain current level of functioning. Does not receive controlled substance pain medication elsewhere.  Patient claims compliance with diabetes medication. No obvious side effects. Reports no substantial low sugar spells. Most numbers are generally in good range when checked fasting. Generally does not miss a dose of medication. Watching diabetic diet closely  Patient utilizes phentermine for weight loss.  Just starting exercise program now.  Would really like to have 3 more months.  No obvious side effects discussed  Patient notes ongoing compliance with antidepressant medication. No obvious side effects. Reports does not miss a dose. Overall continues to help depression substantially. No thoughts of homicide or suicide. Would like to maintain medication. Patient states both her anxiety and depression are stable on current medication.  Wishes to maintain      Review of Systems No headache, no major weight loss or weight gain, no chest pain no back pain abdominal pain no change in bowel habits complete ROS otherwise negative     Objective:   Physical Exam  Virtual      Assessment & Plan:  Impression: Chronic pain. Patient compliant with medication. No substantial side effects. [place] controlled substance registry reviewed to ensure compliance and proper use of medication. Patient aware goal of medicine is not complete resolution of pain but to control his symptoms to improve his functional capacity. Aware  of potential adverse side effects  #2 type 2 diabetes.  Diet reviewed compliance reviewed patient's to start checking sugars more often  3.  Hyperlipidemia prior blood work reviewed maintain same meds diet discussed compliance  discussed  4.  Obesity substantial will refill medications rationale discussed  5.  Chronic depression anxiety clinically stable medicine maintain follow-up in months

## 2019-07-03 ENCOUNTER — Ambulatory Visit: Payer: BC Managed Care – PPO | Admitting: Family Medicine

## 2019-08-23 ENCOUNTER — Other Ambulatory Visit: Payer: Self-pay

## 2019-08-24 ENCOUNTER — Other Ambulatory Visit (INDEPENDENT_AMBULATORY_CARE_PROVIDER_SITE_OTHER): Payer: BC Managed Care – PPO | Admitting: *Deleted

## 2019-08-24 DIAGNOSIS — Z23 Encounter for immunization: Secondary | ICD-10-CM | POA: Diagnosis not present

## 2019-09-24 ENCOUNTER — Other Ambulatory Visit: Payer: Self-pay

## 2019-09-24 ENCOUNTER — Ambulatory Visit (INDEPENDENT_AMBULATORY_CARE_PROVIDER_SITE_OTHER): Payer: BC Managed Care – PPO | Admitting: Family Medicine

## 2019-09-24 ENCOUNTER — Encounter: Payer: Self-pay | Admitting: Family Medicine

## 2019-09-24 DIAGNOSIS — Z79891 Long term (current) use of opiate analgesic: Secondary | ICD-10-CM

## 2019-09-24 MED ORDER — GLIPIZIDE ER 5 MG PO TB24: ORAL_TABLET | ORAL | 5 refills | Status: DC

## 2019-09-24 MED ORDER — OMEPRAZOLE 20 MG PO CPDR: 20.0000 mg | DELAYED_RELEASE_CAPSULE | Freq: Every day | ORAL | 5 refills | Status: DC

## 2019-09-24 MED ORDER — ALPRAZOLAM 1 MG PO TABS: ORAL_TABLET | ORAL | 5 refills | Status: DC

## 2019-09-24 MED ORDER — DULOXETINE HCL 60 MG PO CPEP: 60.0000 mg | ORAL_CAPSULE | Freq: Every day | ORAL | 5 refills | Status: DC

## 2019-09-24 MED ORDER — HYDROCODONE-ACETAMINOPHEN 10-325 MG PO TABS: 1.0000 | ORAL_TABLET | Freq: Four times a day (QID) | ORAL | 0 refills | Status: DC | PRN

## 2019-09-24 MED ORDER — ROSUVASTATIN CALCIUM 10 MG PO TABS: 10.0000 mg | ORAL_TABLET | Freq: Every day | ORAL | 5 refills | Status: DC

## 2019-09-24 MED ORDER — METFORMIN HCL 500 MG PO TABS: 500.0000 mg | ORAL_TABLET | Freq: Two times a day (BID) | ORAL | 5 refills | Status: DC

## 2019-09-24 NOTE — Progress Notes (Signed)
   Subjective:    Patient ID: Catherine Moore, female    DOB: [birth year], 63 y.o.   MRN: [id number]  HPI  This patient was seen today for chronic pain  The medication list was reviewed and updated.   -Compliance with medication: yes  - Number patient states they take daily: 3 a day  -when was the last dose patient took? today  The patient was advised the importance of maintaining medication and not using illegal substances with these.  Here for refills and follow up  The patient was educated that we can provide 3 monthly scripts for their medication, it is their responsibility to follow the instructions.  Side effects or complications from medications: none  Patient is aware that pain medications are meant to minimize the severity of the pain to allow their pain levels to improve to allow for better function. They are aware of that pain medications cannot totally remove their pain.      Virtual Visit via Video Note  I connected with Catherine Moore on 09/24/19 at 11:00 AM EST by a video enabled telemedicine application and verified that I am speaking with the correct person using two identifiers.  Location: Patient: home Provider: office   I discussed the limitations of evaluation and management by telemedicine and the availability of in person appointments. The patient expressed understanding and agreed to proceed.  History of Present Illness:    Observations/Objective:   Assessment and Plan:   Follow Up Instructions:    I discussed the assessment and treatment plan with the patient. The patient was provided an opportunity to ask questions and all were answered. The patient agreed with the plan and demonstrated an understanding of the instructions.   The patient was advised to call back or seek an in-person evaluation if the symptoms worsen or if the condition fails to improve as anticipated.  I provided 15 minutes of non-face-to-face time during this encounter.      Review of Systems No headache, no major weight loss or weight gain, no chest pain no back pain abdominal pain no change in bowel habits complete ROS otherwise negative     Objective:   Physical Exam   Virtual     Assessment & Plan:  Impression chronic pain overall controlled good on current meds.  Chronic anxiety.  Compliant with these meds also  Impression: Chronic pain. Patient compliant with medication. No substantial side effects. [place] controlled substance registry reviewed to ensure compliance and proper use of medication. Patient aware goal of medicine is not complete resolution of pain but to control his symptoms to improve his functional capacity. Aware of potential adverse side effects

## 2019-10-28 ENCOUNTER — Telehealth: Payer: Self-pay | Admitting: Family Medicine

## 2019-10-28 NOTE — Telephone Encounter (Signed)
Pt's hydrocodone needs a prior auth, pt's insurance changed as of 10/18/2019 - see info below  [facility]  ID# [id number], group# [facility] Ph# [phone]  Please advise & call pt when done    [facility] (pt states the pharmacy has already sent [place] a fax requesting prior auth for this medicine)

## 2019-10-28 NOTE — Telephone Encounter (Signed)
PA attempted through Cover My Meds. Await decision

## 2019-10-31 NOTE — Telephone Encounter (Signed)
Pt returned call and informed that PA has been approved

## 2019-10-31 NOTE — Telephone Encounter (Signed)
PA approved from 10/28/2019-01/26/2020. Left message to return call

## 2019-11-01 ENCOUNTER — Ambulatory Visit (INDEPENDENT_AMBULATORY_CARE_PROVIDER_SITE_OTHER): Payer: 59 | Admitting: Nurse Practitioner

## 2019-11-01 ENCOUNTER — Encounter: Payer: Self-pay | Admitting: Nurse Practitioner

## 2019-11-01 ENCOUNTER — Other Ambulatory Visit: Payer: Self-pay

## 2019-11-01 VITALS — BP 126/82 | Temp 97.0°F | Ht 66.0 in | Wt 194.6 lb

## 2019-11-01 DIAGNOSIS — E7800 Pure hypercholesterolemia, unspecified: Secondary | ICD-10-CM | POA: Diagnosis not present

## 2019-11-01 DIAGNOSIS — Z01419 Encounter for gynecological examination (general) (routine) without abnormal findings: Secondary | ICD-10-CM | POA: Diagnosis not present

## 2019-11-01 DIAGNOSIS — R748 Abnormal levels of other serum enzymes: Secondary | ICD-10-CM | POA: Diagnosis not present

## 2019-11-01 DIAGNOSIS — E119 Type 2 diabetes mellitus without complications: Secondary | ICD-10-CM

## 2019-11-01 DIAGNOSIS — M8580 Other specified disorders of bone density and structure, unspecified site: Secondary | ICD-10-CM

## 2019-11-01 DIAGNOSIS — Z780 Asymptomatic menopausal state: Secondary | ICD-10-CM | POA: Diagnosis not present

## 2019-11-01 DIAGNOSIS — Z1231 Encounter for screening mammogram for malignant neoplasm of breast: Secondary | ICD-10-CM

## 2019-11-01 NOTE — Progress Notes (Signed)
   Subjective:    Patient ID: Catherine Moore, female    DOB: 1956/07/16, 64 y.o.   MRN: [id number]  HPI  The patient comes in today for a wellness visit.    A review of their health history was completed.  A review of medications was also completed.  Any needed refills; yes  Eating habits: some days good and some days not so good  Falls/  MVA accidents in past few months: none  Regular exercise: knees hurt when tries to walk  Specialist pt sees on regular basis: Dr G[facility]  Preventative health issues were discussed.   Additional concerns: none  Review of Systems     Objective:   Physical Exam        Assessment & Plan:

## 2019-11-01 NOTE — Progress Notes (Signed)
Subjective:    Patient ID: Catherine Moore, female    DOB: 12/30/1955, 64 y.o.   MRN: [id number]  HPI Patient presents today for annual wellness exam. Patient reports previously losing insurance and being unable to afford dental exams, eye exams, or obtain a mammogram. However, patient denies any areas of concern today.   She reports a moderately healthy diet with exercise limitations due to arthritic pains in BIL knees.    Colonoscopy and Pap smear are up to date.   Review of Systems  General: Patient reports adequate energy level and regular sleeping pattern. Patient denies weight loss or weight gain.  HEENT: Patient denies vision changes, hearing changes, or nasal congestion. Patient denies sore throat or difficulty swallowing.  Cardiac: Patient denies chest pain, pressure, or palpitations. Patient denies Syncope or dizziness.  Pulmonary: Patient denies shortness of breath, dyspnea, or cough.  GI: Patient denies reflux or N/V/D. She reports a bowel movement every 2-3 days and is followed by GI.  GU: Patient denies frequency, urgency, or change in color or odor of urine.  Breast: Patient denies tenderness, pain, or swelling. Patient denies nipple discharge, swelling, or tenderness.  GYN: Patient denies any vaginal bleeding, itching, or discomfort.     Objective:   Physical Exam  General: WDWN. Appears calm and pleasant demeanor. Mild kyphosis noted.   Thyroid: midline without visible enlargement, nodules, or tenderness on palpation.  Lymph: cervical lymph nodes without enlargement, or tenderness. Pulmonary: No adventitious sounds on auscultation. Chest expansion symmetrical Cardiovascular: RRR. S1, S2 intact. No murmurs. No peripheral edema.  Abdomen: Flat, soft, non-tender. No masses, tenderness or organomegaly on palpation.  Breast: breasts symmetrical and smooth without nodules or masses. Nipples symmetrical without discharge or deformity. No tenderness noted to palpation. Axilla  lymph nodes without enlargement or tenderness.  GYN: External genitalia without erythema, lesions, swelling or masses. Urethra without erythema, and midline. Vaginal mucosa pink without discharge. No CMT noted. Uterus palpated by bimanual exam, midline, and without enlargement or masses.  Diabetic Foot Exam - Simple   Simple Foot Form Diabetic Foot exam was performed with the following findings: Yes 11/01/2019  9:50 AM  Visual Inspection No deformities, no ulcerations, no other skin breakdown bilaterally: Yes Sensation Testing Intact to touch and monofilament testing bilaterally: Yes Pulse Check Posterior Tibialis and Dorsalis pulse intact bilaterally: Yes Comments        Assessment & Plan:   Problem List Items Addressed This Visit      Endocrine   Type 2 diabetes mellitus without complication, without long-term current use of insulin (HCC)   Relevant Orders   Lipid Profile (Completed)   Basic Metabolic Panel (BMET) (Completed)   Hemoglobin A1c (Completed)   Hepatic function panel (Completed)   CBC with Diff (Completed)     Other   Elevated liver enzymes   Relevant Orders   Basic Metabolic Panel (BMET) (Completed)   Hepatic function panel (Completed)   CBC with Diff (Completed)   Hyperlipemia   Relevant Orders   Lipid Profile (Completed)   Basic Metabolic Panel (BMET) (Completed)   CBC with Diff (Completed)    Other Visit Diagnoses    Well woman exam    -  Primary   Post-menopausal       Relevant Orders   DG Bone Density   Osteopenia, unspecified location       Relevant Orders   DG Bone Density   Screening mammogram, encounter for       Relevant Orders  MM Digital Screening       -labs: cbc w/ diff, liver panel, hgb A1C, BMP, lipid -Education: continue healthy diet. Patient educated on low impact exercise options. Continue follow up with her specialists.  -Follow Up: based on lab results. Routine follow up for pain management.

## 2019-11-02 LAB — HEMOGLOBIN A1C
Est. average glucose Bld gHb Est-mCnc: 137 mg/dL
Hgb A1c MFr Bld: 6.4 % — ABNORMAL HIGH (ref 4.8–5.6)

## 2019-11-02 LAB — LIPID PANEL
Chol/HDL Ratio: 3.3 ratio (ref 0.0–4.4)
Cholesterol, Total: 147 mg/dL (ref 100–199)
HDL: 45 mg/dL (ref >39)
LDL Chol Calc (NIH): 81 mg/dL (ref 0–99)
Triglycerides: 119 mg/dL (ref 0–149)
VLDL Cholesterol Cal: 21 mg/dL (ref 5–40)

## 2019-11-02 LAB — HEPATIC FUNCTION PANEL
ALT: 26 IU/L (ref 0–32)
AST: 26 IU/L (ref 0–40)
Albumin: 4.8 g/dL (ref 3.8–4.8)
Alkaline Phosphatase: 66 IU/L (ref 39–117)
Bilirubin Total: 0.4 mg/dL (ref 0.0–1.2)
Bilirubin, Direct: 0.11 mg/dL (ref 0.00–0.40)
Total Protein: 7.3 g/dL (ref 6.0–8.5)

## 2019-11-02 LAB — BASIC METABOLIC PANEL
BUN/Creatinine Ratio: 16 (ref 12–28)
BUN: 14 mg/dL (ref 8–27)
CO2: 24 mmol/L (ref 20–29)
Calcium: 9.6 mg/dL (ref 8.7–10.3)
Chloride: 98 mmol/L (ref 96–106)
Creatinine, Ser: 0.86 mg/dL (ref 0.57–1.00)
GFR calc Af Amer: 83 mL/min/{1.73_m2} (ref >59)
GFR calc non Af Amer: 72 mL/min/{1.73_m2} (ref >59)
Glucose: 133 mg/dL — ABNORMAL HIGH (ref 65–99)
Potassium: 4.1 mmol/L (ref 3.5–5.2)
Sodium: 137 mmol/L (ref 134–144)

## 2019-11-02 LAB — CBC WITH DIFFERENTIAL/PLATELET
Basophils Absolute: 0.1 10*3/uL (ref 0.0–0.2)
Basos: 1 %
EOS (ABSOLUTE): 0.5 10*3/uL — ABNORMAL HIGH (ref 0.0–0.4)
Eos: 5 %
Hematocrit: 43 % (ref 34.0–46.6)
Hemoglobin: 14.8 g/dL (ref 11.1–15.9)
Immature Grans (Abs): 0 10*3/uL (ref 0.0–0.1)
Immature Granulocytes: 0 %
Lymphocytes Absolute: 3.7 10*3/uL — ABNORMAL HIGH (ref 0.7–3.1)
Lymphs: 36 %
MCH: 28.5 pg (ref 26.6–33.0)
MCHC: 34.4 g/dL (ref 31.5–35.7)
MCV: 83 fL (ref 79–97)
Monocytes Absolute: 0.5 10*3/uL (ref 0.1–0.9)
Monocytes: 5 %
Neutrophils Absolute: 5.6 10*3/uL (ref 1.4–7.0)
Neutrophils: 53 %
Platelets: 305 10*3/uL (ref 150–450)
RBC: 5.2 x10E6/uL (ref 3.77–5.28)
RDW: 12.7 % (ref 11.7–15.4)
WBC: 10.4 10*3/uL (ref 3.4–10.8)

## 2019-11-03 ENCOUNTER — Encounter: Payer: Self-pay | Admitting: Nurse Practitioner

## 2019-11-08 ENCOUNTER — Ambulatory Visit (HOSPITAL_COMMUNITY)
Admission: RE | Admit: 2019-11-08 | Discharge: 2019-11-08 | Disposition: A | Discharge status: Home / Self Care | Payer: 59 | Source: Ambulatory Visit | Attending: Nurse Practitioner | Admitting: Nurse Practitioner

## 2019-11-08 ENCOUNTER — Other Ambulatory Visit: Payer: Self-pay

## 2019-11-08 DIAGNOSIS — M8580 Other specified disorders of bone density and structure, unspecified site: Secondary | ICD-10-CM | POA: Diagnosis present

## 2019-11-08 DIAGNOSIS — Z1231 Encounter for screening mammogram for malignant neoplasm of breast: Secondary | ICD-10-CM | POA: Insufficient documentation

## 2019-11-08 DIAGNOSIS — Z780 Asymptomatic menopausal state: Secondary | ICD-10-CM | POA: Diagnosis not present

## 2019-11-21 ENCOUNTER — Encounter: Payer: Self-pay | Admitting: Family Medicine

## 2019-12-24 ENCOUNTER — Other Ambulatory Visit: Payer: Self-pay

## 2019-12-24 ENCOUNTER — Ambulatory Visit (INDEPENDENT_AMBULATORY_CARE_PROVIDER_SITE_OTHER): Payer: 59 | Admitting: Family Medicine

## 2019-12-24 ENCOUNTER — Other Ambulatory Visit: Payer: Self-pay | Admitting: Family Medicine

## 2019-12-24 DIAGNOSIS — M1712 Unilateral primary osteoarthritis, left knee: Secondary | ICD-10-CM | POA: Diagnosis not present

## 2019-12-24 DIAGNOSIS — Z79891 Long term (current) use of opiate analgesic: Secondary | ICD-10-CM | POA: Diagnosis not present

## 2019-12-24 MED ORDER — HYDROCODONE-ACETAMINOPHEN 10-325 MG PO TABS: 1.0000 | ORAL_TABLET | Freq: Four times a day (QID) | ORAL | 0 refills | Status: DC | PRN

## 2019-12-24 NOTE — Progress Notes (Signed)
   Subjective:    Patient ID: Catherine Moore, female    DOB: 10/24/1955, 64 y.o.   MRN: [id number]  HPI This patient was seen today for chronic pain.  Pain in knees, back, hands and shoulder  The medication list was reviewed and updated.   -Compliance with medication: yes  - Number patient states they take daily: takes 3 a day  -when was the last dose patient took? today  The patient was advised the importance of maintaining medication and not using illegal substances with these.  Here for refills and follow up  The patient was educated that we can provide 3 monthly scripts for their medication, it is their responsibility to follow the instructions.  Side effects or complications from medications: none  Patient is aware that pain medications are meant to minimize the severity of the pain to allow their pain levels to improve to allow for better function. They are aware of that pain medications cannot totally remove their pain.  Due for UDT ( at least once per year) : last one 07/04/18. Due today but unable to get urine sample since she is doing a virtual visit.   Virtual Visit via Video Note  I connected with Catherine Moore on 12/24/19 at  2:00 PM EST by a video enabled telemedicine application and verified that I am speaking with the correct person using two identifiers.  Location: Patient: home Provider: office   I discussed the limitations of evaluation and management by telemedicine and the availability of in person appointments. The patient expressed understanding and agreed to proceed.  History of Present Illness:    Observations/Objective:   Assessment and Plan:   Follow Up Instructions:    I discussed the assessment and treatment plan with the patient. The patient was provided an opportunity to ask questions and all were answered. The patient agreed with the plan and demonstrated an understanding of the instructions.   The patient was advised to call back or  seek an in-person evaluation if the symptoms worsen or if the condition fails to improve as anticipated.  I provided 20 minutes of non-face-to-face time during this encounter.            Review of Systems No headache no chest pain no shortness of breath    Objective:   Physical Exam  Virtual      Assessment & Plan:  Impression chronic joint pain secondary to osteoarthritis and rheumatoid arthritis.  Patient uses 3 narcotic tablets per day to assist her pain control.  Handles it well.  No obvious side effects  Impression: Chronic pain. Patient compliant with medication. No substantial side effects. [place] controlled substance registry reviewed to ensure compliance and proper use of medication. Patient aware goal of medicine is not complete resolution of pain but to control his symptoms to improve his functional capacity. Aware of potential adverse side effects  Medications refilled follow-up in 3 months.

## 2020-01-30 ENCOUNTER — Other Ambulatory Visit: Payer: Self-pay | Admitting: Family Medicine

## 2020-02-13 ENCOUNTER — Telehealth: Payer: Self-pay

## 2020-02-13 NOTE — Telephone Encounter (Signed)
Pt called with c/o watery diarrhea, nausea, vomiting and abdominal cramping that started yesterday 02/12/20. Pts symptoms started yesterday morning when pt got up. Pt called out of work this morning. Pt reports diarrhea up until 3:00 AM today. Pt vomited yesterday morning twice. Pt doesn't feel this is related to anything she has eaten. When pt drinks any liquids, it goes straight through her system. This morning pt feels very weak. Pt is trying to avoid the ED, she states. Pt is scheduled for next week 02/19/20 with [facility]. Pt does report that these symptoms have happen to her four other times. Pt reports an incident in October 2020, 2 weeks ago and a couple other times in April. Pt states that it's hard for her to make it to the bathroom before diarrhea starts. Pt is taking Imodium AD when diarrhea starts and Omeprazole once daily for Gerd. Pt is having abdominal severe cramping in her mid abdomen. Pt was advised to go to the ED, if she continues to feel weak and can't keep any fluid down.   [phone]

## 2020-02-13 NOTE — Telephone Encounter (Signed)
FYI Spoke with pt. She is at the ED in [place]. Pt wants to keep her apt for 02/19/2020

## 2020-02-13 NOTE — Telephone Encounter (Signed)
I haven't seen her since Oct 2019. With her severe cramping, I do recommend ED evaluation.

## 2020-02-19 ENCOUNTER — Other Ambulatory Visit: Payer: Self-pay

## 2020-02-19 ENCOUNTER — Encounter: Payer: Self-pay | Admitting: Gastroenterology

## 2020-02-19 ENCOUNTER — Ambulatory Visit (INDEPENDENT_AMBULATORY_CARE_PROVIDER_SITE_OTHER): Payer: 59 | Admitting: Gastroenterology

## 2020-02-19 VITALS — BP 137/76 | HR 84 | Temp 96.3°F | Ht 66.0 in | Wt 196.0 lb

## 2020-02-19 DIAGNOSIS — R197 Diarrhea, unspecified: Secondary | ICD-10-CM | POA: Diagnosis not present

## 2020-02-19 DIAGNOSIS — K7581 Nonalcoholic steatohepatitis (NASH): Secondary | ICD-10-CM | POA: Diagnosis not present

## 2020-02-19 DIAGNOSIS — K219 Gastro-esophageal reflux disease without esophagitis: Secondary | ICD-10-CM | POA: Diagnosis not present

## 2020-02-19 DIAGNOSIS — K7460 Unspecified cirrhosis of liver: Secondary | ICD-10-CM

## 2020-02-19 DIAGNOSIS — D72829 Elevated white blood cell count, unspecified: Secondary | ICD-10-CM

## 2020-02-19 DIAGNOSIS — R109 Unspecified abdominal pain: Secondary | ICD-10-CM

## 2020-02-19 NOTE — Progress Notes (Signed)
Primary Care Physician: Mikey Kirschner, MD  Primary Gastroenterologist:  Garfield Cornea, MD   Chief Complaint  Patient presents with  . Abdominal Pain    went to [facility] ED 02/13/20; occ pain now  . Diarrhea    better    HPI: Catherine Moore is a 64 y.o. female here for further evaluation of recurrent abdominal pain, diarrhea.Patient was last seen in October 2019.    Patient reports recent acute onset abdominal cramping and diarrhea.  Symptoms began Wednesday.  Symptoms started out with acute onset left-sided abdominal cramping.  Starts in the left upper quadrant and works its way down to the left lower abdomen and across the bottom of the abdomen.  Symptoms always associated with fecal urgency and multiple episodes of diarrhea.  Usually no vomiting although she had a single episode of vomiting with the last episode last week.  Patient had similar episode back in October as well.  This one she classified as her "first" episode.  She had another episode approximately 2 weeks prior to this last episode which lasted for less than 24 hours and not as severe.  She went to the ED for evaluation on February 13, 2020.  White blood cell count 14,200 with left shift, hemoglobin 15.2, LFTs normal, CT unremarkable as outlined below.  In between episodes she has no abdominal pain.  She states her stools are not "normal" are regular.  May go a couple of days without a bowel movement but then will usually pass a normal stool or may be slightly harder stool.  She denies melena or rectal bleeding.  She denies any ill contacts.  She has a dog at home.  No livestock or chickens.  Heartburn well controlled.  She was given Bentyl in the ER, not taking at this time.  Currently doing better, last episode of diarrhea on Sunday.  Continues to have some abdominal soreness but overall much improved.  Retired 05/2018. Was taken care of Mother, then she passed away January 15, 2020. Has went back to work, PT [employer].   Patient  also has a history of early cirrhosis. She has a history of biopsy-proven Karlene Lineman with wedge pressures consistent with portal hypertension.  Elastography with metavir F3/F4, early cirrhosis on biopsy.  Negative hep C antibody, ANA negative, hepatitis B negative, autoimmune, PBC, celiac serologies unremarkable.  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  Current CT, liver unremarkable.  Gallbladder unremarkable.  EGD in October 2019 showed multiple gastric polyps, pathology fundic gland, gastric biopsies with no H. Pylori.  Last abdominal ultrasound October 2019 showing cholelithiasis, possible adenomyomatosis, hepatic steatosis but no focal liver lesions.  Current Outpatient Medications  Medication Sig Dispense Refill  . ALPRAZolam (XANAX) 1 MG tablet Take 1/2 to 1 tablet po bid prn anxiety 60 tablet 5  . blood glucose meter kit and supplies Dispense based on patient and insurance preference. Use up to four times daily as directed. (FOR ICD-10 E10.9, E11.9). 1 each 0  . cetirizine (ZYRTEC) 10 MG tablet TAKE 1 TABLET BY MOUTH EVERY DAY 30 tablet 5  . dicyclomine (BENTYL) 10 MG capsule Take 10 mg by mouth 4 (four) times daily as needed.    . DULoxetine (CYMBALTA) 60 MG capsule Take 1 capsule (60 mg total) by mouth daily. 30 capsule 5  . fluticasone (FLONASE) 50 MCG/ACT nasal spray Place 2 sprays into both nostrils daily. (Patient taking differently: Place 2 sprays into both nostrils as needed. ) 16 g 3  . glipiZIDE (  GLUCOTROL XL) 5 MG 24 hr tablet TAKE ONE TABLET BY MOUTH EVERY DAY WITH BREAKFAST 30 tablet 5  . HYDROcodone-acetaminophen (NORCO) 10-325 MG tablet Take 1 tablet by mouth every 6 (six) hours as needed for severe pain. 90 tablet 0  . HYDROcodone-acetaminophen (NORCO) 10-325 MG tablet Take 1 tablet by mouth every 6 (six) hours as needed for severe pain. 90 tablet 0  . HYDROcodone-acetaminophen (NORCO) 10-325 MG tablet Take 1 tablet by mouth every 6 (six) hours as needed for severe pain. 90 tablet  0  . Lactobacillus Rhamnosus, GG, (CULTURELLE PO) Take by mouth daily.    . metFORMIN (GLUCOPHAGE) 500 MG tablet Take 1 tablet (500 mg total) by mouth 2 (two) times daily with a meal. 60 tablet 5  . omeprazole (PRILOSEC) 20 MG capsule Take 1 capsule (20 mg total) by mouth daily. 30 capsule 5  . rosuvastatin (CRESTOR) 10 MG tablet Take 1 tablet (10 mg total) by mouth daily. 30 tablet 5  . vitamin B-12 (CYANOCOBALAMIN) 1000 MCG tablet Take 1,000 mcg by mouth daily.     No current facility-administered medications for this visit.    Allergies as of 02/19/2020 - Review Complete 02/19/2020  Allergen Reaction Noted  . Zocor [simvastatin] Nausea Only 02/01/2013  . Penicillins Swelling and Rash 07/19/2010   Past Medical History:  Diagnosis Date  . Anxiety   . Arthritis   . Cirrhosis ([place])   . Complication of anesthesia   . Depression   . Diabetes ([place])   . GERD (gastroesophageal reflux disease)   . Hyperlipidemia   . PONV (postoperative nausea and vomiting)    Past Surgical History:  Procedure Laterality Date  . BIOPSY  08/16/2016   Procedure: BIOPSY;  Surgeon: Daneil Dolin, MD;  [facility]: [facility];  Service: Endoscopy;;  . BIOPSY  08/16/2018   Procedure: BIOPSY;  Surgeon: Daneil Dolin, MD;  Location: [facility];  Service: Endoscopy;;  gastric  . COLONOSCOPY  08/09/2010   IHK:[id number] rectum colon  . ENDOMETRIAL ABLATION    . ESOPHAGOGASTRODUODENOSCOPY N/A 08/16/2016   Dr. Gala Romney: normal esophagus, GAVE, multiple gastric polyps s/p biopsy (hyperplastic), normal duodenum   . ESOPHAGOGASTRODUODENOSCOPY (EGD) WITH PROPOFOL N/A 08/16/2018   Rourk: Multiple gastric polyps, pathology revealed fundic gland, gastric biopsies negative for H. pylori.  . IR GENERIC HISTORICAL  06/13/2016   IR TRANSCATHETER BX 06/13/2016 [facility]  . IR GENERIC HISTORICAL  06/13/2016   IR US GUIDE VASC ACCESS RIGHT 06/13/2016 [facility]  . IR GENERIC HISTORICAL  06/13/2016   IR VENOGRAM HEPATIC W  HEMODYNAMIC EVALUATION 06/13/2016 [facility]   Family History  Problem Relation Age of Onset  . Diabetes Father   . Hypertension Father   . Heart attack Father 23       MI  . Stroke Father   . Osteoporosis Mother   . Arthritis Sister   . Arthritis Brother   . Colon cancer Neg Hx   . Liver disease Neg Hx    Social History   Tobacco Use  . Smoking status: Former Smoker    Packs/day: 1.50    Years: 20.00    Pack years: 30.00    Types: Cigarettes    Start date: 05/23/1971    Quit date: 06/16/1991    Years since quitting: 28.6  . Smokeless tobacco: Never Used  Substance Use Topics  . Alcohol use: No    Alcohol/week: 0.0 standard drinks  . Drug use: No    ROS:  General: Negative for anorexia, weight loss, fever, chills, fatigue, weakness. ENT: Negative for hoarseness, difficulty swallowing , nasal congestion. CV: Negative for chest pain, angina, palpitations, dyspnea on exertion, peripheral edema.  Respiratory: Negative for dyspnea at rest, dyspnea on exertion, cough, sputum, wheezing.  GI: See history of present illness. GU:  Negative for dysuria, hematuria, urinary incontinence, urinary frequency, nocturnal urination.  Endo: Negative for unusual weight change.    Physical Examination:   BP 137/76   Pulse 84   Temp (!) 96.3 F (35.7 C) (Temporal)   Ht 5' 6"  (1.676 m)   Wt 196 lb (88.9 kg)   BMI 31.64 kg/m   General: Well-nourished, well-developed in no acute distress.  Eyes: No icterus. Mouth: Oropharyngeal mucosa moist and pink , no lesions erythema or exudate. Lungs: Clear to auscultation bilaterally.  Heart: Regular rate and rhythm, no murmurs rubs or gallops.  Abdomen: Bowel sounds are normal, nontender, nondistended, no hepatosplenomegaly or masses, no abdominal bruits or hernia , no rebound or guarding.   Extremities: No lower extremity edema. No clubbing or deformities. Neuro: Alert and oriented x 4   Skin: Warm and dry, no jaundice.   Psych: Alert and  cooperative, normal mood and affect.  Labs:  Lab Results  Component Value Date   WBC 10.4 11/01/2019   HGB 14.8 11/01/2019   HCT 43.0 11/01/2019   MCV 83 11/01/2019   PLT 305 11/01/2019   Lab Results  Component Value Date   CREATININE 0.86 11/01/2019   BUN 14 11/01/2019   NA 137 11/01/2019   K 4.1 11/01/2019   CL 98 11/01/2019   CO2 24 11/01/2019   Lab Results  Component Value Date   ALT 26 11/01/2019   AST 26 11/01/2019   ALKPHOS 66 11/01/2019   BILITOT 0.4 11/01/2019   Lab Results  Component Value Date   HGBA1C 6.4 (H) 11/01/2019     Labs from 02/13/2020: White blood cell count 14,200, hemoglobin 15.2, platelets 308,000, albumin 4.5, alk phos 68, total bilirubin 0.6, ALT 22, sodium 139, lipase 33,  Imaging Studies:    CLINICAL DATA: LEFT LOWER QUADRANT tenderness. Possible colitis versus diverticulitis.  EXAM: CT ABDOMEN AND PELVIS WITH CONTRAST  TECHNIQUE: Multidetector CT imaging of the abdomen and pelvis was performed using the standard protocol following bolus administration of intravenous contrast.  CONTRAST: 100 ml Isovue 370  COMPARISON: Ultrasound of the abdomen on 07/27/2018  FINDINGS: Lower chest: No acute abnormality.  Hepatobiliary: No focal liver abnormality is seen. No radiopaque gallstones, biliary dilatation, or pericholecystic inflammatory changes.  Pancreas: Unremarkable. No pancreatic ductal dilatation or surrounding inflammatory changes.  Spleen: Normal in size without focal abnormality.  Adrenals/Urinary Tract: Adrenal glands are unremarkable. Kidneys are normal, without renal calculi, focal lesion, or hydronephrosis. Bladder is unremarkable.  Stomach/Bowel: Normal appearance of the stomach and small bowel loops. The appendix is well seen and has a normal appearance. Colon is unremarkable. No significant diverticular disease. No inflammatory changes or mass.  Vascular/Lymphatic: There is atherosclerotic calcification of  the abdominal aorta. No aneurysm. No retroperitoneal or mesenteric adenopathy.  Reproductive: Uterus is present. No adnexal mass.  Other: No abdominal wall hernia or abnormality. No abdominopelvic ascites.  Musculoskeletal: No acute or significant osseous findings.  IMPRESSION: No evidence for acute abnormality of the abdomen or pelvis.  Aortic atherosclerosis. (ICD10-I70.0)   Electronically Signed By: Nolon Nations M.D. On: 02/13/2020 17:06    Assessment/plan:  Very pleasant 64 year old female with biopsy-proven Karlene Lineman with early cirrhosis (2017), elevated wedge  pressures consistent with cirrhosis/portal hypertension with extensive work-up in 2017 for significantly elevated ferritin (since has normalized) presenting for follow-up of episodic acute onset left-sided abdominal pain associated with diarrhea.  Left-sided abdominal pain with diarrhea: For episodes since October 2020.  First episode significant, 2 milder episodes in between.  Last episode 1 week ago, the most severe yet, lasting 3 days.  ED evaluation with mild leukocytosis with left shift, CT unremarkable.  Symptoms resolved spontaneously.  Would query infectious etiology versus postinfectious IBS versus IBD.  Less likely microscopic colitis given episodic nature of symptoms.  Less likely medication effect given episodic nature of symptoms.  If she has recurrent diarrhea she will obtain stool studies.  Order was provided.  We'll recheck CBC to follow-up on elevated white blood cells.  Recently with left shift, 2 months ago she had elevated lymphocytes.  Patient is currently due for 10-year follow-up colonoscopy.  Given symptoms, would pursue colonoscopy for further evaluation.  Plan for deep sedation given polypharmacy.  Patient would like antiemetics during her procedure due to history of post anesthesia vomiting.  I have discussed the risks, alternatives, benefits with regards to but not limited to the risk of reaction to  medication, bleeding, infection, perforation and the patient is agreeable to proceed. Written consent to be obtained.  GERD: Well-controlled on PPI.  Karlene Lineman with early cirrhosis: Liver imaging up-to-date.  Recent LFTs unremarkable.  No evidence of thrombocytopenia.  Last EGD in 2017.  Continue to monitor for evidence of decompensation.  We'll have her follow-up in 6 months.

## 2020-02-19 NOTE — Patient Instructions (Signed)
1. Colonoscopy as scheduled. See separate instructions.  2. You can use bentyl for abdominal cramping and loose stool. Hold for constipation. 3. If you have recurrent diarrhea, please take stool to lab for testing.  4. Go for blood work in two weeks to follow up on elevated white blood cells.  5. Call with recurrent symptoms. 6. We will have you come back in six months for recheck on liver.

## 2020-02-25 ENCOUNTER — Telehealth: Payer: Self-pay | Admitting: Internal Medicine

## 2020-02-25 NOTE — Telephone Encounter (Signed)
Patient aware we awaiting our august schedule. Once it is received we will call to schedule. She voiced understanding.

## 2020-02-25 NOTE — Telephone Encounter (Signed)
Patient called inquiring about when her procedure will be scheduled

## 2020-03-05 ENCOUNTER — Telehealth: Payer: Self-pay | Admitting: Family Medicine

## 2020-03-05 NOTE — Telephone Encounter (Signed)
Pt returned call. Pt states she would like to be switched to [place] if possible. Pt states that [name] knows her situation. Pt states she does not want to go to Pain Management. Informed patient that we would see if we can get her switched over to [place]. Pt transferred up front to schedule appt with [name].

## 2020-03-05 NOTE — Telephone Encounter (Signed)
Catherine Moore, [name], DO  P [facility] Clinical Pool  Any patient needing appt for controlled substances or "med checks" needs in person appt and to establish care with me.   Pls call pt and switch to in person visit for 03/18/20.   And let her know that we will discuss her mediations and she may or may not be getting refills on these medications from me, but will be the discretion of the provider to give refills or refer. She may be referred to pain management or psychiatry for continued chronic medications.   Thanks,   Dr. Lovena Moore    Left message to return call

## 2020-03-05 NOTE — Telephone Encounter (Signed)
Please let her know that she may keep the appt if she wishes but will be discussing tapering off these medications or referred out.  I do not prescribe this combination or this high of a dose of pain medication. So we can submit the referral now or she can wait.   Thank you,   Dr. Lovena Le

## 2020-03-05 NOTE — Telephone Encounter (Signed)
Left message to return call 

## 2020-03-05 NOTE — Telephone Encounter (Signed)
Pt returned call. Pt states she spoke to [place] before he left (at her last appt) and pt states that [place] told her that her medications will not be transferred and that he was going to speak to Dr.Taylor regarding this. Please advise. Thank you.

## 2020-03-10 ENCOUNTER — Telehealth: Payer: Self-pay

## 2020-03-10 ENCOUNTER — Other Ambulatory Visit: Payer: Self-pay

## 2020-03-10 NOTE — Telephone Encounter (Signed)
PA for TCS submitted via [employer] website. Ref# [id number].

## 2020-03-10 NOTE — Telephone Encounter (Signed)
Pre-op/covid test 04/03/20 at 11:30am. Appt letter mailed with procedure instructions.

## 2020-03-10 NOTE — Telephone Encounter (Signed)
Tried to call pt to schedule TCS w/Propofol, no answer, LMOVM for return call.

## 2020-03-10 NOTE — Telephone Encounter (Signed)
Colonoscopy approved. Request case# [id number], valid 03/10/20-06/08/20.

## 2020-03-10 NOTE — Telephone Encounter (Signed)
Pt called office, TCS w/Propofol scheduled for 04/07/20 at 7:30am. Orders entered.

## 2020-03-18 ENCOUNTER — Ambulatory Visit (INDEPENDENT_AMBULATORY_CARE_PROVIDER_SITE_OTHER): Payer: 59 | Admitting: Nurse Practitioner

## 2020-03-18 ENCOUNTER — Ambulatory Visit: Payer: 59 | Admitting: Family Medicine

## 2020-03-18 ENCOUNTER — Other Ambulatory Visit: Payer: Self-pay

## 2020-03-18 VITALS — BP 132/82 | Temp 98.1°F | Wt 194.6 lb

## 2020-03-18 DIAGNOSIS — Z79891 Long term (current) use of opiate analgesic: Secondary | ICD-10-CM

## 2020-03-18 DIAGNOSIS — F32A Depression, unspecified: Secondary | ICD-10-CM

## 2020-03-18 DIAGNOSIS — F419 Anxiety disorder, unspecified: Secondary | ICD-10-CM

## 2020-03-18 DIAGNOSIS — F329 Major depressive disorder, single episode, unspecified: Secondary | ICD-10-CM | POA: Diagnosis not present

## 2020-03-18 MED ORDER — HYDROCODONE-ACETAMINOPHEN 5-325 MG PO TABS: ORAL_TABLET | ORAL | 0 refills | Status: DC

## 2020-03-18 NOTE — Progress Notes (Signed)
Subjective:    Patient ID: Catherine Moore, female    DOB: 1955/10/20, 64 y.o.   MRN: [id number]  HPI  This patient was seen today for chronic pain  The medication list was reviewed and updated.   -Compliance with medication: yes  - Number patient states they take daily: 4 a day  -when was the last dose patient took? today  The patient was advised the importance of maintaining medication and not using illegal substances with these.  Here for refills and follow up  The patient was educated that we can provide 3 monthly scripts for their medication, it is their responsibility to follow the instructions.  Side effects or complications from medications: none  Patient is aware that pain medications are meant to minimize the severity of the pain to allow their pain levels to improve to allow for better function. They are aware of that pain medications cannot totally remove their pain.  Pain medication bring pain to a tolerable level. Working part time at [employer] in the office. Has multiple joint pain including the back due to arthritis.  Has to limit Tylenol and Ibuprofen use based on advice from her GI specialist.  Currently taking Xanax 1 mg mainly at night for sleep.     Review of Systems     Objective:   Physical Exam NAD. Alert, oriented. Very upset and crying at times during visit. Lungs clear. Heart RRR.        Assessment & Plan:   Problem List Items Addressed This Visit      Other   Anxiety and depression    Other Visit Diagnoses    Encounter for long-term use of opiate analgesic    -  Primary     Discussed current recommendations from the CDC which cautions against the use of Benzodiazepines and opiates together. Patient feels she can stop the Xanax. Was willing to try Trazodone for sleep. Has some Xanax left so she can wean off med. Consulted with Dr. Lovena Le who is her designated PCP. She needs to reduce her hydrocodone dosing to 5 mg with 30 per month.    When this was discussed with patient she became very upset. States she was told by Dr. Richardson Landry that her meds would not change with new provider.  At that point, she requested that we DC her Glipizide, Metformin and Crestor at her pharmacy as well.  It is not clear at this point if patient wishes to continue care at our office. If so, follow up in 3 months.  Meds ordered this encounter  Medications  . DISCONTD: HYDROcodone-acetaminophen (NORCO/VICODIN) 5-325 MG tablet    Sig: Take one tab po TID prn pain    Dispense:  30 tablet    Refill:  0    Order Specific Question:   Supervising Provider    Answer:   Sallee Lange A [[id number]]  . DISCONTD: HYDROcodone-acetaminophen (NORCO/VICODIN) 5-325 MG tablet    Sig: Take one tab po TID prn pain    Dispense:  30 tablet    Refill:  0    May fill 30 days from 03/18/20    Order Specific Question:   Supervising Provider    Answer:   Sallee Lange A [[id number]]  . HYDROcodone-acetaminophen (NORCO/VICODIN) 5-325 MG tablet    Sig: Take one tab po TID prn pain    Dispense:  30 tablet    Refill:  0    May fill 60 days from 03/18/20  Order Specific Question:   Supervising Provider    Answer:   Sallee Lange A [phone]

## 2020-03-19 ENCOUNTER — Encounter: Payer: Self-pay | Admitting: Nurse Practitioner

## 2020-03-19 ENCOUNTER — Telehealth: Payer: Self-pay | Admitting: Family Medicine

## 2020-03-19 NOTE — Telephone Encounter (Signed)
Pt asked to DC Crestor, glipizide, and metformin yesterday. It is noted in her chart but she needs the pharmacy notified to discontinue these medications as well.   [facility].

## 2020-03-19 NOTE — Telephone Encounter (Signed)
I saw that the meds were discontinued in her chart but I didn't see anything in the note -- ok to cancel any scripts at pharmacy?

## 2020-03-19 NOTE — Telephone Encounter (Signed)
Sorry, I discontinued in the chart. Please DC at the pharmacy per patient request. Thanks.

## 2020-03-19 NOTE — Telephone Encounter (Signed)
[facility] notified.

## 2020-03-22 ENCOUNTER — Other Ambulatory Visit: Payer: Self-pay | Admitting: Family Medicine

## 2020-03-24 ENCOUNTER — Other Ambulatory Visit: Payer: Self-pay

## 2020-03-24 ENCOUNTER — Ambulatory Visit (INDEPENDENT_AMBULATORY_CARE_PROVIDER_SITE_OTHER): Payer: 59 | Admitting: Family Medicine

## 2020-03-24 ENCOUNTER — Other Ambulatory Visit: Payer: Self-pay | Admitting: Family Medicine

## 2020-03-24 ENCOUNTER — Encounter: Payer: Self-pay | Admitting: Family Medicine

## 2020-03-24 VITALS — BP 128/76 | HR 89 | Temp 98.0°F | Ht 66.0 in | Wt 194.0 lb

## 2020-03-24 DIAGNOSIS — M1712 Unilateral primary osteoarthritis, left knee: Secondary | ICD-10-CM

## 2020-03-24 DIAGNOSIS — M1812 Unilateral primary osteoarthritis of first carpometacarpal joint, left hand: Secondary | ICD-10-CM

## 2020-03-24 DIAGNOSIS — G4700 Insomnia, unspecified: Secondary | ICD-10-CM

## 2020-03-24 DIAGNOSIS — G894 Chronic pain syndrome: Secondary | ICD-10-CM | POA: Diagnosis not present

## 2020-03-24 NOTE — Progress Notes (Signed)
Patient ID: Catherine Moore, female    DOB: [birth year], 64 y.o.   MRN: [id number]   Chief Complaint  Patient presents with  . Medication Problem   Subjective:    HPI  pt arrives to discuss xanax and hydrocodone medication refills. Pt seen last week by NP and was decreased on some of her medications due to my recommendations.  Pt wanting to stay on xanax and hydrocodone.  Pt stating was taking 31m hydrocodone 4x per day for "pain all over."  Pt stating was taking xanax at night for sleep.  "Can't sleep without it'" however, told the NP on her last visit she was willing to taper off of it and try trazodone.  Pt declining to take psychiatry referral for the anxiety/insomnia medications. Pt stating she would like to find a pain doctor that would prescribe these medications again.   Medical History CEishahas a past medical history of Anxiety, Arthritis, Cirrhosis ([place], Complication of anesthesia, Depression, Diabetes ([place], GERD (gastroesophageal reflux disease), Hyperlipidemia, and PONV (postoperative nausea and vomiting).   Outpatient Encounter Medications as of 03/24/2020  Medication Sig  . blood glucose meter kit and supplies Dispense based on patient and insurance preference. Use up to four times daily as directed. (FOR ICD-10 E10.9, E11.9).  . cetirizine (ZYRTEC) 10 MG tablet TAKE 1 TABLET BY MOUTH EVERY DAY (Patient taking differently: Take 10 mg by mouth daily. TAKE 1 TABLET BY MOUTH EVERY DAY)  . dicyclomine (BENTYL) 10 MG capsule Take 10 mg by mouth 4 (four) times daily as needed for spasms.   . DULoxetine (CYMBALTA) 60 MG capsule TAKE 1 CAPSULE BY MOUTH EVERY DAY  . fluticasone (FLONASE) 50 MCG/ACT nasal spray Place 2 sprays into both nostrils daily. (Patient taking differently: Place 2 sprays into both nostrils daily as needed for allergies. )  . HYDROcodone-acetaminophen (NORCO/VICODIN) 5-325 MG tablet Take 1 tablet by mouth.  .Marland Kitchenomeprazole (PRILOSEC) 20 MG capsule Take 1  capsule (20 mg total) by mouth daily.  . vitamin B-12 (CYANOCOBALAMIN) 1000 MCG tablet Take 1,000 mcg by mouth daily.  . Lactobacillus Rhamnosus, GG, (CULTURELLE PO) Take 1 capsule by mouth daily.   . [DISCONTINUED] DULoxetine (CYMBALTA) 60 MG capsule Take 1 capsule (60 mg total) by mouth daily.   No facility-administered encounter medications on file as of 03/24/2020.     Review of Systems  Musculoskeletal:       +"pain all over"  Psychiatric/Behavioral: Positive for sleep disturbance.    Vitals BP 128/76   Pulse 89   Temp 98 F (36.7 C)   Ht 5' 6"  (1.676 m)   Wt 194 lb (88 kg)   SpO2 99%   BMI 31.31 kg/m   Objective:   Physical Exam Vitals and nursing note reviewed.  Constitutional:      General: She is not in acute distress.    Appearance: Normal appearance.  HENT:     Nose: Nose normal.  Cardiovascular:     Rate and Rhythm: Normal rate and regular rhythm.  Musculoskeletal:        General: Normal range of motion.  Skin:    General: Skin is warm and dry.     Findings: No rash.  Neurological:     General: No focal deficit present.     Mental Status: She is alert and oriented to person, place, and time.     Cranial Nerves: No cranial nerve deficit.  Psychiatric:        Mood and Affect: Mood  normal.        Behavior: Behavior normal.      Assessment and Plan   1. Chronic pain syndrome - Ambulatory referral to Pain Clinic  2. Arthritis of knee, left - Ambulatory referral to Pain Clinic  3. Primary osteoarthritis of first carpometacarpal joint of left hand - Ambulatory referral to Pain Clinic  4. Insomnia, unspecified type    I advised NP [name] 1 wk ago on pt last visit, that I would be okay with 46m hydrocodone tid prn pain, 30 tab per month for as needed pain.  And NP discussed pt needing to taper off the xanax and try trazodone for sleep.  That was discussed last week at the appt. Reviewed reasons that we are not prescribing these combination of  medications due to risks of sedation, falls, etc.  Pt decided she wanted to come in to discuss the issue with me in person. I advised pt that I can give her a referral for pain and anxiety, however, wasn't going to be able to continue to prescribe at those doses.  Pt not wanting to decrease or taper off.  Gave referral for pain management.

## 2020-03-25 LAB — CBC WITH DIFFERENTIAL/PLATELET
Basophils Absolute: 0.1 10*3/uL (ref 0.0–0.2)
Basos: 1 %
EOS (ABSOLUTE): 0.4 10*3/uL (ref 0.0–0.4)
Eos: 6 %
Hematocrit: 41.8 % (ref 34.0–46.6)
Hemoglobin: 13.9 g/dL (ref 11.1–15.9)
Immature Grans (Abs): 0 10*3/uL (ref 0.0–0.1)
Immature Granulocytes: 0 %
Lymphocytes Absolute: 2.5 10*3/uL (ref 0.7–3.1)
Lymphs: 40 %
MCH: 28.5 pg (ref 26.6–33.0)
MCHC: 33.3 g/dL (ref 31.5–35.7)
MCV: 86 fL (ref 79–97)
Monocytes Absolute: 0.4 10*3/uL (ref 0.1–0.9)
Monocytes: 6 %
Neutrophils Absolute: 2.9 10*3/uL (ref 1.4–7.0)
Neutrophils: 47 %
Platelets: 276 10*3/uL (ref 150–450)
RBC: 4.88 x10E6/uL (ref 3.77–5.28)
RDW: 13 % (ref 11.7–15.4)
WBC: 6.3 10*3/uL (ref 3.4–10.8)

## 2020-03-27 ENCOUNTER — Encounter: Payer: Self-pay | Admitting: Family Medicine

## 2020-04-01 ENCOUNTER — Telehealth: Payer: Self-pay | Admitting: Emergency Medicine

## 2020-04-01 NOTE — Telephone Encounter (Signed)
If her diarrhea and abdominal pain has resolved (ov was more than 6 weeks ago), I'm okay with colonoscopy being a screening colonoscopy but patient needs to be cautioned that based on findings and intervention at time of colonoscopy, ie polyp removed etc, billing could change.   Let's run this by RMR and Rosendo Gros first.

## 2020-04-01 NOTE — Telephone Encounter (Signed)
Diarrhea with left side abdominal pain was diagnosis used for procedure. Will await advice from [facility].

## 2020-04-01 NOTE — Telephone Encounter (Signed)
Communication noted.  

## 2020-04-01 NOTE — Patient Instructions (Signed)
Catherine Moore  04/01/2020     @PREFPERIOPPHARMACY @   Your procedure is scheduled on  04/07/2020   Report to [facility] at  [place]M.  Call this number if you have problems the morning of surgery:  [phone]   Remember:  Follow the diet and prep instructions given to you by Dr Roseanne Kaufman office.                    Take these medicines the morning of surgery with A SIP OF WATER  Zyrtec,bentyl(if needed), cymbalta, hydrocodone(if needed), prilosec.    Do not wear jewelry, make-up or nail polish.  Do not wear lotions, powders, or perfumes. Please wear deodorant and brush your teeth.  Do not shave 48 hours prior to surgery.  Men may shave face and neck.  Do not bring valuables to the hospital.  [facility] is not responsible for any belongings or valuables.  Contacts, dentures or bridgework may not be worn into surgery.  Leave your suitcase in the car.  After surgery it may be brought to your room.  For patients admitted to the hospital, discharge time will be determined by your treatment team.  Patients discharged the day of surgery will not be allowed to drive home.   Name and phone number of your driver:   family Special instructions:  DO NOT smoke the morning of your procedure.  Please read over the following fact sheets that you were given. Anesthesia Post-op Instructions and Care and Recovery After Surgery       Colonoscopy, Adult, Care After This sheet gives you information about how to care for yourself after your procedure. Your health care provider may also give you more specific instructions. If you have problems or questions, contact your health care provider. What can I expect after the procedure? After the procedure, it is common to have:  A small amount of blood in your stool for 24 hours after the procedure.  Some gas.  Mild cramping or bloating of your abdomen. Follow these instructions at home: Eating and drinking   Drink enough fluid to  keep your urine pale yellow.  Follow instructions from your health care provider about eating or drinking restrictions.  Resume your normal diet as instructed by your health care provider. Avoid heavy or fried foods that are hard to digest. Activity  Rest as told by your health care provider.  Avoid sitting for a long time without moving. Get up to take short walks every 1-2 hours. This is important to improve blood flow and breathing. Ask for help if you feel weak or unsteady.  Return to your normal activities as told by your health care provider. Ask your health care provider what activities are safe for you. Managing cramping and bloating   Try walking around when you have cramps or feel bloated.  Apply heat to your abdomen as told by your health care provider. Use the heat source that your health care provider recommends, such as a moist heat pack or a heating pad. ? Place a towel between your skin and the heat source. ? Leave the heat on for 20-30 minutes. ? Remove the heat if your skin turns bright red. This is especially important if you are unable to feel pain, heat, or cold. You may have a greater risk of getting burned. General instructions  For the first 24 hours after the procedure: ? Do not drive or use machinery. ?  Do not sign important documents. ? Do not drink alcohol. ? Do your regular daily activities at a slower pace than normal. ? Eat soft foods that are easy to digest.  Take over-the-counter and prescription medicines only as told by your health care provider.  Keep all follow-up visits as told by your health care provider. This is important. Contact a health care provider if:  You have blood in your stool 2-3 days after the procedure. Get help right away if you have:  More than a small spotting of blood in your stool.  Large blood clots in your stool.  Swelling of your abdomen.  Nausea or vomiting.  A fever.  Increasing pain in your abdomen that  is not relieved with medicine. Summary  After the procedure, it is common to have a small amount of blood in your stool. You may also have mild cramping and bloating of your abdomen.  For the first 24 hours after the procedure, do not drive or use machinery, sign important documents, or drink alcohol.  Get help right away if you have a lot of blood in your stool, nausea or vomiting, a fever, or increased pain in your abdomen. This information is not intended to replace advice given to you by your health care provider. Make sure you discuss any questions you have with your health care provider. Document Revised: 04/29/2019 Document Reviewed: 04/29/2019 Elsevier Patient Education  [place] After These instructions provide you with information about caring for yourself after your procedure. Your health care provider may also give you more specific instructions. Your treatment has been planned according to current medical practices, but problems sometimes occur. Call your health care provider if you have any problems or questions after your procedure. What can I expect after the procedure? After your procedure, you may:  Feel sleepy for several hours.  Feel clumsy and have poor balance for several hours.  Feel forgetful about what happened after the procedure.  Have poor judgment for several hours.  Feel nauseous or vomit.  Have a sore throat if you had a breathing tube during the procedure. Follow these instructions at home: For at least 24 hours after the procedure:      Have a responsible adult stay with you. It is important to have someone help care for you until you are awake and alert.  Rest as needed.  Do not: ? Participate in activities in which you could fall or become injured. ? Drive. ? Use heavy machinery. ? Drink alcohol. ? Take sleeping pills or medicines that cause drowsiness. ? Make important decisions or sign legal  documents. ? Take care of children on your own. Eating and drinking  Follow the diet that is recommended by your health care provider.  If you vomit, drink water, juice, or soup when you can drink without vomiting.  Make sure you have little or no nausea before eating solid foods. General instructions  Take over-the-counter and prescription medicines only as told by your health care provider.  If you have sleep apnea, surgery and certain medicines can increase your risk for breathing problems. Follow instructions from your health care provider about wearing your sleep device: ? Anytime you are sleeping, including during daytime naps. ? While taking prescription pain medicines, sleeping medicines, or medicines that make you drowsy.  If you smoke, do not smoke without supervision.  Keep all follow-up visits as told by your health care provider. This is important. Contact a health  care provider if:  You keep feeling nauseous or you keep vomiting.  You feel light-headed.  You develop a rash.  You have a fever. Get help right away if:  You have trouble breathing. Summary  For several hours after your procedure, you may feel sleepy and have poor judgment.  Have a responsible adult stay with you for at least 24 hours or until you are awake and alert. This information is not intended to replace advice given to you by your health care provider. Make sure you discuss any questions you have with your health care provider. Document Revised: 01/01/2018 Document Reviewed: 01/24/2016 Elsevier Patient Education  [place].

## 2020-04-01 NOTE — Telephone Encounter (Signed)
Pt called and stated she is scheduled to have a colonoscopy on 6/22. However, she contacted her insurance company and they stated that with the dx code being used for the procedure she will have to pay $2,000 out of pocket. The pt stated is not able to pay that out of pocket and  stated that she is no longer having diarrhea she wanted to know if we are able to use the dx code "wellness visit " since she is no longer having any issues. If not the pt request the procedure be cancelled.

## 2020-04-02 NOTE — Telephone Encounter (Signed)
Left a detailed message for pt. Pt was notified that if RMR finds any polyps or anything suspicious when he does the tcs, that this could change her diagnosis and there are no guarantees until the claim is submitted.

## 2020-04-02 NOTE — Telephone Encounter (Signed)
Pt called and wants to know if we have called the hospital to change the TCS coding. Pt has to have her covid test tomorrow and won't go unless this has been changed. Please advise on how to change this, MB isn't in the office.

## 2020-04-02 NOTE — Telephone Encounter (Signed)
I spoke with Rosendo Gros, I have changed it to a screening tcs on the orders, please let the pt know that if RMR finds any polyps or anything suspicious when he does the tcs, that this could change her diagnosis and there are no guarantees until the claim is submitted.

## 2020-04-03 ENCOUNTER — Other Ambulatory Visit: Payer: Self-pay

## 2020-04-03 ENCOUNTER — Encounter (HOSPITAL_COMMUNITY)
Admission: RE | Admit: 2020-04-03 | Discharge: 2020-04-03 | Disposition: A | Discharge status: Home / Self Care | Payer: 59 | Source: Ambulatory Visit | Attending: Internal Medicine | Admitting: Internal Medicine

## 2020-04-03 ENCOUNTER — Other Ambulatory Visit (HOSPITAL_COMMUNITY)
Admission: RE | Admit: 2020-04-03 | Discharge: 2020-04-03 | Disposition: A | Discharge status: Home / Self Care | Payer: 59 | Source: Ambulatory Visit | Attending: Internal Medicine | Admitting: Internal Medicine

## 2020-04-03 DIAGNOSIS — Z01812 Encounter for preprocedural laboratory examination: Secondary | ICD-10-CM | POA: Insufficient documentation

## 2020-04-03 DIAGNOSIS — Z20822 Contact with and (suspected) exposure to covid-19: Secondary | ICD-10-CM | POA: Diagnosis not present

## 2020-04-03 LAB — SARS CORONAVIRUS 2 (TAT 6-24 HRS): SARS Coronavirus 2: NEGATIVE

## 2020-04-03 LAB — COMPREHENSIVE METABOLIC PANEL
ALT: 42 U/L (ref 0–44)
AST: 33 U/L (ref 15–41)
Albumin: 4.1 g/dL (ref 3.5–5.0)
Alkaline Phosphatase: 57 U/L (ref 38–126)
Anion gap: 14 (ref 5–15)
BUN: 14 mg/dL (ref 8–23)
CO2: 26 mmol/L (ref 22–32)
Calcium: 9.1 mg/dL (ref 8.9–10.3)
Chloride: 99 mmol/L (ref 98–111)
Creatinine, Ser: 0.68 mg/dL (ref 0.44–1.00)
GFR calc Af Amer: >60 mL/min (ref >60)
GFR calc non Af Amer: >60 mL/min (ref >60)
Glucose, Bld: 238 mg/dL — ABNORMAL HIGH (ref 70–99)
Potassium: 3.8 mmol/L (ref 3.5–5.1)
Sodium: 139 mmol/L (ref 135–145)
Total Bilirubin: 0.6 mg/dL (ref 0.3–1.2)
Total Protein: 7.2 g/dL (ref 6.5–8.1)

## 2020-04-03 LAB — PROTIME-INR
INR: 1 (ref 0.8–1.2)
Prothrombin Time: 12.8 seconds (ref 11.4–15.2)

## 2020-04-03 NOTE — Telephone Encounter (Signed)
Reviewed

## 2020-04-07 ENCOUNTER — Encounter (HOSPITAL_COMMUNITY)
Admission: RE | Disposition: A | Discharge status: Home / Self Care | Payer: Self-pay | Source: Home / Self Care | Attending: Internal Medicine

## 2020-04-07 ENCOUNTER — Ambulatory Visit (HOSPITAL_COMMUNITY)
Admission: RE | Admit: 2020-04-07 | Discharge: 2020-04-07 | Disposition: A | Discharge status: Home / Self Care | Payer: 59 | Attending: Internal Medicine | Admitting: Internal Medicine

## 2020-04-07 ENCOUNTER — Ambulatory Visit (HOSPITAL_COMMUNITY): Payer: 59 | Admitting: Anesthesiology

## 2020-04-07 ENCOUNTER — Encounter: Payer: Self-pay | Admitting: Nurse Practitioner

## 2020-04-07 DIAGNOSIS — F419 Anxiety disorder, unspecified: Secondary | ICD-10-CM | POA: Diagnosis not present

## 2020-04-07 DIAGNOSIS — E119 Type 2 diabetes mellitus without complications: Secondary | ICD-10-CM | POA: Diagnosis not present

## 2020-04-07 DIAGNOSIS — Z1211 Encounter for screening for malignant neoplasm of colon: Secondary | ICD-10-CM | POA: Diagnosis present

## 2020-04-07 DIAGNOSIS — K7460 Unspecified cirrhosis of liver: Secondary | ICD-10-CM | POA: Insufficient documentation

## 2020-04-07 DIAGNOSIS — K219 Gastro-esophageal reflux disease without esophagitis: Secondary | ICD-10-CM | POA: Diagnosis not present

## 2020-04-07 DIAGNOSIS — Z87891 Personal history of nicotine dependence: Secondary | ICD-10-CM | POA: Diagnosis not present

## 2020-04-07 DIAGNOSIS — D120 Benign neoplasm of cecum: Secondary | ICD-10-CM | POA: Insufficient documentation

## 2020-04-07 DIAGNOSIS — M1990 Unspecified osteoarthritis, unspecified site: Secondary | ICD-10-CM | POA: Diagnosis not present

## 2020-04-07 DIAGNOSIS — D124 Benign neoplasm of descending colon: Secondary | ICD-10-CM | POA: Insufficient documentation

## 2020-04-07 HISTORY — PX: COLONOSCOPY WITH PROPOFOL: SHX5780

## 2020-04-07 HISTORY — PX: POLYPECTOMY: SHX5525

## 2020-04-07 LAB — GLUCOSE, CAPILLARY
Glucose-Capillary: 252 mg/dL — ABNORMAL HIGH (ref 70–99)
Glucose-Capillary: 245 mg/dL — ABNORMAL HIGH (ref 70–99)

## 2020-04-07 SURGERY — COLONOSCOPY WITH PROPOFOL
Anesthesia: General

## 2020-04-07 MED ORDER — METFORMIN HCL 500 MG PO TABS: 500.0000 mg | ORAL_TABLET | Freq: Two times a day (BID) | ORAL | 2 refills | Status: DC

## 2020-04-07 MED ORDER — ONDANSETRON HCL 4 MG/2ML IJ SOLN
INTRAMUSCULAR | Status: AC
  Filled 2020-04-07: qty 2

## 2020-04-07 MED ORDER — CHLORHEXIDINE GLUCONATE CLOTH 2 % EX PADS: 6.0000 | MEDICATED_PAD | Freq: Once | CUTANEOUS | Status: DC

## 2020-04-07 MED ORDER — LACTATED RINGERS IV SOLN: INTRAVENOUS | Status: DC | PRN

## 2020-04-07 MED ORDER — LACTATED RINGERS IV SOLN: Freq: Once | INTRAVENOUS | Status: AC

## 2020-04-07 MED ORDER — PROPOFOL 500 MG/50ML IV EMUL
INTRAVENOUS | Status: DC | PRN
  Administered 2020-04-07: 150 ug/kg/min via INTRAVENOUS

## 2020-04-07 MED ORDER — GLIPIZIDE ER 5 MG PO TB24: ORAL_TABLET | ORAL | 2 refills | Status: DC

## 2020-04-07 MED ORDER — GLYCOPYRROLATE 0.2 MG/ML IJ SOLN
INTRAMUSCULAR | Status: DC | PRN
  Administered 2020-04-07: .2 mg via INTRAVENOUS

## 2020-04-07 NOTE — Anesthesia Preprocedure Evaluation (Signed)
Anesthesia Evaluation  Patient identified by MRN, date of birth, ID band Patient awake    Reviewed: Allergy & Precautions, NPO status , Patient's Chart, lab work & pertinent test results  History of Anesthesia Complications (+) PONV and history of anesthetic complications  Airway Mallampati: III  TM Distance: >3 FB Neck ROM: Full    Dental  (+) Partial Lower, Caps, Dental Advisory Given   Pulmonary former smoker,    Pulmonary exam normal breath sounds clear to auscultation       Cardiovascular Exercise Tolerance: Good METS: 3 - Mets Normal cardiovascular exam Rhythm:Regular Rate:Normal - Systolic murmurs, - Diastolic murmurs and - Friction Rub    Neuro/Psych PSYCHIATRIC DISORDERS Anxiety Depression    GI/Hepatic GERD  Medicated,(+) Cirrhosis       , Hepatitis -C/o nausea   Endo/Other  diabetes (not on meds), Poorly Controlled, Type 2  Renal/GU      Musculoskeletal  (+) Arthritis , Osteoarthritis,    Abdominal   Peds  Hematology   Anesthesia Other Findings   Reproductive/Obstetrics                           Anesthesia Physical Anesthesia Plan  ASA: III  Anesthesia Plan: General   Post-op Pain Management:    Induction: Intravenous  PONV Risk Score and Plan: 3 and TIVA and Ondansetron  Airway Management Planned: Natural Airway and Nasal Cannula  Additional Equipment:   Intra-op Plan:   Post-operative Plan:   Informed Consent: I have reviewed the patients History and Physical, chart, labs and discussed the procedure including the risks, benefits and alternatives for the proposed anesthesia with the patient or authorized representative who has indicated his/her understanding and acceptance.     Dental advisory given  Plan Discussed with: CRNA and Surgeon  Anesthesia Plan Comments:        Anesthesia Quick Evaluation

## 2020-04-07 NOTE — Op Note (Signed)
[facility] Patient Name: Catherine Moore Procedure Date: 04/07/2020 7:13 AM MRN: [id number] Date of Birth: 1956-08-03 Attending MD: Norvel Richards , MD CSN: [id number] Age: 64 Admit Type: Outpatient Procedure:                Colonoscopy Indications:              Screening for colorectal malignant neoplasm (no                            diarrhea in 3 months) Providers:                Norvel Richards, MD, Crystal Page, Rosina Lowenstein, RN, Nelma Rothman, Technician Referring MD:              Medicines:                Propofol per Anesthesia Complications:            No immediate complications. Estimated Blood Loss:     Estimated blood loss was minimal. Procedure:                Pre-Anesthesia Assessment:                           - Prior to the procedure, a History and Physical                            was performed, and patient medications and                            allergies were reviewed. The patient's tolerance of                            previous anesthesia was also reviewed. The risks                            and benefits of the procedure and the sedation                            options and risks were discussed with the patient.                            All questions were answered, and informed consent                            was obtained. Prior Anticoagulants: The patient has                            taken no previous anticoagulant or antiplatelet                            agents. ASA Grade Assessment: II - A patient with  mild systemic disease. After reviewing the risks                            and benefits, the patient was deemed in                            satisfactory condition to undergo the procedure.                           After obtaining informed consent, the colonoscope                            was passed under direct vision. Throughout the                            procedure, the  patient's blood pressure, pulse, and                            oxygen saturations were monitored continuously. The                            CF-[id number] ([id number]) scope was introduced through                            the anus and advanced to the the cecum, identified                            by appendiceal orifice and ileocecal valve. The                            colonoscopy was performed without difficulty. The                            patient tolerated the procedure well. The quality                            of the bowel preparation was adequate. Scope In: 7:40:19 AM Scope Out: 8:02:11 AM Scope Withdrawal Time: 0 hours 14 minutes 52 seconds  Total Procedure Duration: 0 hours 21 minutes 52 seconds  Findings:      The perianal and digital rectal examinations were normal.      Two sessile polyps were found in the descending colon and cecum. The       polyps were 4 to 6 mm in size. These polyps were removed with a cold       snare. Resection and retrieval were complete. Estimated blood loss was       minimal.      A 3 mm polyp was found in the cecum. The polyp was sessile. The polyp       was removed with a cold biopsy forceps. Resection and retrieval were       complete. Estimated blood loss was minimal.      The exam was otherwise without abnormality on direct and retroflexion       views. Impression:               -  Two 4 to 6 mm polyps in the descending colon and                            in the cecum, removed with a cold snare. Resected                            and retrieved.                           - One 3 mm polyp in the cecum, removed with a cold                            biopsy forceps. Resected and retrieved.                           - The examination was otherwise normal on direct                            and retroflexion views. Moderate Sedation:      Moderate (conscious) sedation was personally administered by an       anesthesia professional. The  following parameters were monitored: oxygen       saturation, heart rate, blood pressure, and response to care. Recommendation:           - Patient has a contact number available for                            emergencies. The signs and symptoms of potential                            delayed complications were discussed with the                            patient. Return to normal activities tomorrow.                            Written discharge instructions were provided to the                            patient.                           - Resume previous diet.                           - Continue present medications.                           - Repeat colonoscopy date to be determined after                            pending pathology results are reviewed for                            surveillance.                           -  Return to GI office in 3 months. Procedure Code(s):        --- Professional ---                           [phone], Colonoscopy, flexible; with removal of                            tumor(s), polyp(s), or other lesion(s) by snare                            technique                           45380, 53, Colonoscopy, flexible; with biopsy,                            single or multiple Diagnosis Code(s):        --- Professional ---                           K63.5, Polyp of colon                           Z12.11, Encounter for screening for malignant                            neoplasm of colon CPT copyright 2019 American Medical Association. All rights reserved. The codes documented in this report are preliminary and upon coder review may  be revised to meet current compliance requirements. Cristopher Estimable. Ezana Hubbert, MD Norvel Richards, MD 04/07/2020 8:12:43 AM This report has been signed electronically. Number of Addenda: 0

## 2020-04-07 NOTE — Transfer of Care (Signed)
Immediate Anesthesia Transfer of Care Note  Patient: Catherine Moore  Procedure(s) Performed: COLONOSCOPY WITH PROPOFOL (N/A ) POLYPECTOMY  Patient Location: PACU  Anesthesia Type:MAC  Level of Consciousness: awake, alert , oriented and patient cooperative  Airway & Oxygen Therapy: Patient Spontanous Breathing and Patient connected to nasal cannula oxygen  Post-op Assessment: Report given to RN, Post -op Vital signs reviewed and stable and Patient moving all extremities  Post vital signs: Reviewed and stable  Last Vitals:  Vitals Value Taken Time  BP 105/53 04/07/20 0805  Temp    Pulse 98 04/07/20 0806  Resp 15 04/07/20 0806  SpO2 98 % 04/07/20 0806  Vitals shown include unvalidated device data.  Last Pain:  Vitals:   04/07/20 0735  TempSrc:   PainSc: 0-No pain         Complications: No complications documented.

## 2020-04-07 NOTE — Anesthesia Postprocedure Evaluation (Signed)
Anesthesia Post Note  Patient: Catherine Moore  Procedure(s) Performed: COLONOSCOPY WITH PROPOFOL (N/A ) POLYPECTOMY  Patient location during evaluation: PACU Anesthesia Type: General Level of consciousness: awake, oriented, awake and alert and patient cooperative Pain management: pain level controlled Vital Signs Assessment: post-procedure vital signs reviewed and stable Respiratory status: spontaneous breathing, nonlabored ventilation and respiratory function stable Cardiovascular status: blood pressure returned to baseline and stable Postop Assessment: no headache and no backache Anesthetic complications: no   No complications documented.   Last Vitals:  Vitals:   04/07/20 0638  BP: (!) 166/72  Pulse: 97  Resp: 16  Temp: (!) 36.3 C  SpO2: 96%    Last Pain:  Vitals:   04/07/20 0735  TempSrc:   PainSc: 0-No pain                 Tacy Learn

## 2020-04-07 NOTE — H&P (Signed)
_0 @   Primary Care Physician:  Catherine Simmer, NP Primary Gastroenterologist:  Dr. Gala Moore  Pre-Procedure History & Physical: HPI:  Catherine Moore is a 64 y.o. female here for screening colonoscopy.  History of chronic diarrhea.  Last colonoscopy 2011 (normal).  States since she started a probiotic diarrhea has resolved.  She had absolutely no diarrhea and over 3 months.  Past Medical History:  Diagnosis Date  . Anxiety   . Arthritis   . Cirrhosis ([place])   . Complication of anesthesia   . Depression   . Diabetes ([place])   . GERD (gastroesophageal reflux disease)   . Hyperlipidemia   . PONV (postoperative nausea and vomiting)     Past Surgical History:  Procedure Laterality Date  . BIOPSY  08/16/2016   Procedure: BIOPSY;  Surgeon: Catherine Dolin, MD;  [facility]: [facility];  Service: Endoscopy;;  . BIOPSY  08/16/2018   Procedure: BIOPSY;  Surgeon: Catherine Dolin, MD;  Location: [facility];  Service: Endoscopy;;  gastric  . COLONOSCOPY  08/09/2010   MPN:TIRWER rectum colon  . ENDOMETRIAL ABLATION    . ESOPHAGOGASTRODUODENOSCOPY N/A 08/16/2016   Dr. Gala Moore: normal esophagus, GAVE, multiple gastric polyps s/p biopsy (hyperplastic), normal duodenum   . ESOPHAGOGASTRODUODENOSCOPY (EGD) WITH PROPOFOL N/A 08/16/2018   Catherine Moore: Multiple gastric polyps, pathology revealed fundic gland, gastric biopsies negative for H. pylori.  . IR GENERIC HISTORICAL  06/13/2016   IR TRANSCATHETER BX 06/13/2016 [facility]  . IR GENERIC HISTORICAL  06/13/2016   IR US GUIDE VASC ACCESS RIGHT 06/13/2016 [facility]  . IR GENERIC HISTORICAL  06/13/2016   IR VENOGRAM HEPATIC W HEMODYNAMIC EVALUATION 06/13/2016 [facility]    Prior to Admission medications   Medication Sig Start Date End Date Taking? Authorizing Provider  cetirizine (ZYRTEC) 10 MG tablet TAKE 1 TABLET BY MOUTH EVERY DAY Patient taking differently: Take 10 mg by mouth daily. TAKE 1 TABLET BY MOUTH EVERY DAY 12/24/19  Yes Catherine Kirschner, MD  dicyclomine (BENTYL) 10 MG capsule Take 10 mg by mouth 4 (four) times daily as needed for spasms.  02/13/20  Yes [provider]  DULoxetine (CYMBALTA) 60 MG capsule TAKE 1 CAPSULE BY MOUTH EVERY DAY 03/24/20  Yes Catherine Simmer, NP  fluticasone (FLONASE) 50 MCG/ACT nasal spray Place 2 sprays into both nostrils daily. Patient taking differently: Place 2 sprays into both nostrils daily as needed for allergies.  09/03/18  Yes Catherine Kirschner, MD  Lactobacillus Rhamnosus, GG, (CULTURELLE PO) Take 1 capsule by mouth daily.    Yes [provider]  omeprazole (PRILOSEC) 20 MG capsule Take 1 capsule (20 mg total) by mouth daily. 09/24/19  Yes Catherine Kirschner, MD  vitamin B-12 (CYANOCOBALAMIN) 1000 MCG tablet Take 1,000 mcg by mouth daily.   Yes [provider]  blood glucose meter kit and supplies Dispense based on patient and insurance preference. Use up to four times daily as directed. (FOR ICD-10 E10.9, E11.9). 02/11/19   Catherine Kirschner, MD  HYDROcodone-acetaminophen (NORCO/VICODIN) 5-325 MG tablet Take 1 tablet by mouth.    [provider]    Allergies as of 03/10/2020 - Review Complete 02/19/2020  Allergen Reaction Noted  . Zocor [simvastatin] Nausea Only 02/01/2013  . Penicillins Swelling and Rash 07/19/2010    Family History  Problem Relation Age of Onset  . Diabetes Father   . Hypertension Father   . Heart attack Father 98       MI  .  Stroke Father   . Osteoporosis Mother   . Arthritis Sister   . Arthritis Brother   . Colon cancer Neg Hx   . Liver disease Neg Hx     Social History   Socioeconomic History  . Marital status: Married    Spouse name: Not on file  . Number of children: Not on file  . Years of education: Not on file  . Highest education level: Not on file  Occupational History  . Not on file  Tobacco Use  . Smoking status: Former Smoker    Packs/day: 1.50    Years: 20.00    Pack years: 30.00    Types:  Cigarettes    Start date: 05/23/1971    Quit date: 06/16/1991    Years since quitting: 28.8  . Smokeless tobacco: Never Used  Vaping Use  . Vaping Use: Never used  Substance and Sexual Activity  . Alcohol use: No    Alcohol/week: 0.0 standard drinks  . Drug use: No  . Sexual activity: Yes    Birth control/protection: Post-menopausal, Surgical  Other Topics Concern  . Not on file  Social History Narrative  . Not on file   Social Determinants of Health   Financial Resource Strain:   . Difficulty of Paying Living Expenses:   Food Insecurity:   . Worried About Charity fundraiser in the Last Year:   . Arboriculturist in the Last Year:   Transportation Needs:   . Film/video editor (Medical):   Marland Kitchen Lack of Transportation (Non-Medical):   Physical Activity:   . Days of Exercise per Week:   . Minutes of Exercise per Session:   Stress:   . Feeling of Stress :   Social Connections:   . Frequency of Communication with Friends and Family:   . Frequency of Social Gatherings with Friends and Family:   . Attends Religious Services:   . Active Member of Clubs or Organizations:   . Attends Archivist Meetings:   Marland Kitchen Marital Status:   Intimate Partner Violence:   . Fear of Current or Ex-Partner:   . Emotionally Abused:   Marland Kitchen Physically Abused:   . Sexually Abused:     Review of Systems: See HPI, otherwise negative ROS  Physical Exam: BP (!) 166/72   Pulse 97   Temp (!) 97.4 F (36.3 C) (Oral)   Resp 16   Ht _0  (1.676 m)   Wt 86.2 kg   SpO2 96%   BMI 30.67 kg/m  General:   Alert,  Well-developed, well-nourished, pleasant and cooperative in NAD Neck:  Supple; no masses or thyromegaly. No significant cervical adenopathy. Lungs:  Clear throughout to auscultation.   No wheezes, crackles, or rhonchi. No acute distress. Heart:  Regular rate and rhythm; no murmurs, clicks, rubs,  or gallops. Abdomen: Non-distended, normal bowel sounds.  Soft and nontender without  appreciable mass or hepatosplenomegaly.  Pulses:  Normal pulses noted. Extremities:  Without clubbing or edema.  Impression/Plan: 64 year old lady due for average rescreening colonoscopy.  Prior history of diarrhea has now been resolved for some time.  I have offered the patient a screening colonoscopy today per plan. The risks, benefits, limitations, alternatives and imponderables have been reviewed with the patient. Questions have been answered. All parties are agreeable.      Notice: This dictation was prepared with Dragon dictation along with smaller phrase technology. Any transcriptional errors that result from this process are unintentional and may not  be corrected upon review.

## 2020-04-07 NOTE — Discharge Instructions (Signed)
Colonoscopy Discharge Instructions  Read the instructions outlined below and refer to this sheet in the next few weeks. These discharge instructions provide you with general information on caring for yourself after you leave the hospital. Your doctor may also give you specific instructions. While your treatment has been planned according to the most current medical practices available, unavoidable complications occasionally occur. If you have any problems or questions after discharge, call Dr. Gala Romney at [phone]. ACTIVITY  You may resume your regular activity, but move at a slower pace for the next 24 hours.   Take frequent rest periods for the next 24 hours.   Walking will help get rid of the air and reduce the bloated feeling in your belly (abdomen).   No driving for 24 hours (because of the medicine (anesthesia) used during the test).    Do not sign any important legal documents or operate any machinery for 24 hours (because of the anesthesia used during the test).  NUTRITION  Drink plenty of fluids.   You may resume your normal diet as instructed by your doctor.   Begin with a light meal and progress to your normal diet. Heavy or fried foods are harder to digest and may make you feel sick to your stomach (nauseated).   Avoid alcoholic beverages for 24 hours or as instructed.  MEDICATIONS  You may resume your normal medications unless your doctor tells you otherwise.  WHAT YOU CAN EXPECT TODAY  Some feelings of bloating in the abdomen.   Passage of more gas than usual.   Spotting of blood in your stool or on the toilet paper.  IF YOU HAD POLYPS REMOVED DURING THE COLONOSCOPY:  No aspirin products for 7 days or as instructed.   No alcohol for 7 days or as instructed.   Eat a soft diet for the next 24 hours.  FINDING OUT THE RESULTS OF YOUR TEST Not all test results are available during your visit. If your test results are not back during the visit, make an appointment  with your caregiver to find out the results. Do not assume everything is normal if you have not heard from your caregiver or the medical facility. It is important for you to follow up on all of your test results.  SEEK IMMEDIATE MEDICAL ATTENTION IF:  You have more than a spotting of blood in your stool.   Your belly is swollen (abdominal distention).   You are nauseated or vomiting.   You have a temperature over 101.   You have abdominal pain or discomfort that is severe or gets worse throughout the day.    3 polyps removed from your colon today  Further recommendations to follow pending review of pathology report  At patient request I called Genella Mech at [phone] -left message  PATIENT INSTRUCTIONS POST-ANESTHESIA  IMMEDIATELY FOLLOWING SURGERY:  Do not drive or operate machinery for the first twenty four hours after surgery.  Do not make any important decisions for twenty four hours after surgery or while taking narcotic pain medications or sedatives.  If you develop intractable nausea and vomiting or a severe headache please notify your doctor immediately.  FOLLOW-UP:  Please make an appointment with your surgeon as instructed. You do not need to follow up with anesthesia unless specifically instructed to do so.  WOUND CARE INSTRUCTIONS (if applicable):  Keep a dry clean dressing on the anesthesia/puncture wound site if there is drainage.  Once the wound has quit draining you may leave it open  to air.  Generally you should leave the bandage intact for twenty four hours unless there is drainage.  If the epidural site drains for more than 36-48 hours please call the anesthesia department.  QUESTIONS?:  Please feel free to call your physician or the hospital operator if you have any questions, and they will be happy to assist you.      Colon Polyps  Polyps are tissue growths inside the body. Polyps can grow in many places, including the large intestine (colon). A polyp may  be a round bump or a mushroom-shaped growth. You could have one polyp or several. Most colon polyps are noncancerous (benign). However, some colon polyps can become cancerous over time. Finding and removing the polyps early can help prevent this. What are the causes? The exact cause of colon polyps is not known. What increases the risk? You are more likely to develop this condition if you:  Have a family history of colon cancer or colon polyps.  Are older than 6 or older than 45 if you are African American.  Have inflammatory bowel disease, such as ulcerative colitis or Crohn's disease.  Have certain hereditary conditions, such as: ? Familial adenomatous polyposis. ? Lynch syndrome. ? Turcot syndrome. ? Peutz-Jeghers syndrome.  Are overweight.  Smoke cigarettes.  Do not get enough exercise.  Drink too much alcohol.  Eat a diet that is high in fat and red meat and low in fiber.  Had childhood cancer that was treated with abdominal radiation. What are the signs or symptoms? Most polyps do not cause symptoms. If you have symptoms, they may include:  Blood coming from your rectum when having a bowel movement.  Blood in your stool. The stool may look dark red or black.  Abdominal pain.  A change in bowel habits, such as constipation or diarrhea. How is this diagnosed? This condition is diagnosed with a colonoscopy. This is a procedure in which a lighted, flexible scope is inserted into the anus and then passed into the colon to examine the area. Polyps are sometimes found when a colonoscopy is done as part of routine cancer screening tests. How is this treated? Treatment for this condition involves removing any polyps that are found. Most polyps can be removed during a colonoscopy. Those polyps will then be tested for cancer. Additional treatment may be needed depending on the results of testing. Follow these instructions at home: Lifestyle  Maintain a healthy weight, or  lose weight if recommended by your health care provider.  Exercise every day or as told by your health care provider.  Do not use any products that contain nicotine or tobacco, such as cigarettes and e-cigarettes. If you need help quitting, ask your health care provider.  If you drink alcohol, limit how much you have: ? 0-1 drink a day for women. ? 0-2 drinks a day for men.  Be aware of how much alcohol is in your drink. In the [place] one drink equals one 12 oz bottle of beer (355 mL), one 5 oz glass of wine (148 mL), or one 1 oz shot of hard liquor (44 mL). Eating and drinking   Eat foods that are high in fiber, such as fruits, vegetables, and whole grains.  Eat foods that are high in calcium and vitamin D, such as milk, cheese, yogurt, eggs, liver, fish, and broccoli.  Limit foods that are high in fat, such as fried foods and desserts.  Limit the amount of red meat and processed meat  you eat, such as hot dogs, sausage, bacon, and lunch meats. General instructions  Keep all follow-up visits as told by your health care provider. This is important. ? This includes having regularly scheduled colonoscopies. ? Talk to your health care provider about when you need a colonoscopy. Contact a health care provider if:  You have new or worsening bleeding during a bowel movement.  You have new or increased blood in your stool.  You have a change in bowel habits.  You lose weight for no known reason. Summary  Polyps are tissue growths inside the body. Polyps can grow in many places, including the colon.  Most colon polyps are noncancerous (benign), but some can become cancerous over time.  This condition is diagnosed with a colonoscopy.  Treatment for this condition involves removing any polyps that are found. Most polyps can be removed during a colonoscopy. This information is not intended to replace advice given to you by your health care provider. Make sure you discuss any questions  you have with your health care provider. Document Revised: 01/18/2018 Document Reviewed: 01/18/2018 Elsevier Patient Education  [place].

## 2020-04-08 ENCOUNTER — Encounter: Payer: Self-pay | Admitting: Internal Medicine

## 2020-04-08 ENCOUNTER — Other Ambulatory Visit: Payer: Self-pay | Admitting: *Deleted

## 2020-04-08 DIAGNOSIS — R748 Abnormal levels of other serum enzymes: Secondary | ICD-10-CM

## 2020-04-08 DIAGNOSIS — E119 Type 2 diabetes mellitus without complications: Secondary | ICD-10-CM

## 2020-04-08 DIAGNOSIS — E7800 Pure hypercholesterolemia, unspecified: Secondary | ICD-10-CM

## 2020-04-08 LAB — SURGICAL PATHOLOGY

## 2020-04-10 ENCOUNTER — Encounter (HOSPITAL_COMMUNITY): Payer: Self-pay | Admitting: Internal Medicine

## 2020-04-27 ENCOUNTER — Telehealth: Payer: Self-pay | Admitting: Nurse Practitioner

## 2020-04-27 NOTE — Telephone Encounter (Signed)
Patient is requesting a new prescription for omeprazole 20 mg for twice a day instead of once a day called into [facility].

## 2020-04-27 NOTE — Telephone Encounter (Signed)
Please advise. Thank you

## 2020-04-28 ENCOUNTER — Other Ambulatory Visit: Payer: Self-pay | Admitting: Nurse Practitioner

## 2020-04-28 MED ORDER — OMEPRAZOLE 20 MG PO CPDR: 20.0000 mg | DELAYED_RELEASE_CAPSULE | Freq: Two times a day (BID) | ORAL | 5 refills | Status: DC

## 2020-04-28 NOTE — Telephone Encounter (Signed)
Done

## 2020-05-04 ENCOUNTER — Other Ambulatory Visit: Payer: Self-pay | Admitting: *Deleted

## 2020-05-04 MED ORDER — FLUTICASONE PROPIONATE 50 MCG/ACT NA SUSP: 2.0000 | Freq: Every day | NASAL | 1 refills | Status: DC

## 2020-06-19 ENCOUNTER — Other Ambulatory Visit: Payer: Self-pay | Admitting: Family Medicine

## 2020-07-20 ENCOUNTER — Other Ambulatory Visit: Payer: Self-pay | Admitting: Family Medicine

## 2020-08-10 ENCOUNTER — Telehealth: Payer: Self-pay | Admitting: *Deleted

## 2020-08-10 NOTE — Telephone Encounter (Signed)
Agree. Thx. Dr. Lovena Le

## 2020-08-10 NOTE — Telephone Encounter (Signed)
Patient called and stated she started having chest pain at work and feeling bad. Patient states her husband will be there in a minute to pick her up. Advised patient to have her husband driver her to nearest ER. Patient states she is really close to [facility] in [place] and will go right there.

## 2020-08-14 ENCOUNTER — Ambulatory Visit: Payer: 59 | Admitting: Gastroenterology

## 2020-08-21 ENCOUNTER — Ambulatory Visit: Payer: 59 | Admitting: Gastroenterology

## 2020-08-28 ENCOUNTER — Encounter: Payer: Self-pay | Admitting: Internal Medicine

## 2020-09-17 ENCOUNTER — Other Ambulatory Visit: Payer: Self-pay | Admitting: Nurse Practitioner

## 2020-09-18 NOTE — Telephone Encounter (Signed)
Please tell patient that she needs regular follow up appointment with [place]. It can be with me, Santiago Glad or Dr. Lovena Le. Thanks.

## 2020-09-21 NOTE — Telephone Encounter (Signed)
Please call and schedule appt. See carolyn's message below

## 2020-09-23 NOTE — Telephone Encounter (Signed)
Pt made Med Check appt on 12/28 with [name] will pt need blood work

## 2020-09-23 NOTE — Telephone Encounter (Signed)
Sent patient my chart message and called also

## 2020-10-06 ENCOUNTER — Other Ambulatory Visit: Payer: Self-pay | Admitting: Family Medicine

## 2020-10-13 ENCOUNTER — Encounter: Payer: Self-pay | Admitting: Family Medicine

## 2020-10-13 ENCOUNTER — Ambulatory Visit (INDEPENDENT_AMBULATORY_CARE_PROVIDER_SITE_OTHER): Payer: 59 | Admitting: Family Medicine

## 2020-10-13 ENCOUNTER — Other Ambulatory Visit: Payer: Self-pay

## 2020-10-13 VITALS — BP 128/78 | HR 90 | Temp 97.6°F | Wt 188.6 lb

## 2020-10-13 DIAGNOSIS — E119 Type 2 diabetes mellitus without complications: Secondary | ICD-10-CM | POA: Diagnosis not present

## 2020-10-13 NOTE — Progress Notes (Signed)
Patient ID: Catherine Moore, female    DOB: Apr 10, 1956, 64 y.o.   MRN: [id number]   Chief Complaint  Patient presents with   Diabetes   Subjective:    HPI   Pt f/u for diabetes today.   Pt has h/o chronic pain and anxiety- seeing Dr. Janith Lima office for this. Seeing PA Florene Glen, at Dr. Merlene Laughter for her. At the neurology office.  Seeing her for pain medications and anxiety medications.  Seeing Dr. Sydell Axon, had colonosocopy. Ok to return in 10 yrs in 2031 for repeat.   Dm2- Compliant with medications. Checking blood glucose.   Not seeing any high or low numbers.  Denies polyuria or polydipsia.  Eye exam: overdue, pt stating she will call for appt. Foot exam: no concerns.  Medical History Ankita has a past medical history of Anxiety, Arthritis, Cirrhosis ([place]), Complication of anesthesia, Depression, Diabetes ([place]), GERD (gastroesophageal reflux disease), Hyperlipidemia, and PONV (postoperative nausea and vomiting).   Outpatient Encounter Medications as of 10/13/2020  Medication Sig   ALPRAZolam (XANAX) 1 MG tablet Take 1 mg by mouth daily as needed.   blood glucose meter kit and supplies Dispense based on patient and insurance preference. Use up to four times daily as directed. (FOR ICD-10 E10.9, E11.9).   cetirizine (ZYRTEC) 10 MG tablet TAKE 1 TABLET BY MOUTH EVERY DAY   dicyclomine (BENTYL) 10 MG capsule Take 10 mg by mouth 4 (four) times daily as needed for spasms.    DULoxetine (CYMBALTA) 60 MG capsule TAKE 1 CAPSULE BY MOUTH EVERY DAY   fluticasone (FLONASE) 50 MCG/ACT nasal spray INSTILL 2 SPRAYS IN EACH NOSTRIL EVERY DAY   gabapentin (NEURONTIN) 300 MG capsule gabapentin 300 mg capsule  TAKE TWO CAPSULES BY MOUTH EVERY DAY   glipiZIDE (GLUCOTROL XL) 5 MG 24 hr tablet TAKE 1 TABLET BY MOUTH EVERY DAY WITH BREAKFAST   HYDROcodone-acetaminophen (NORCO) 10-325 MG tablet hydrocodone 10 mg-acetaminophen 325 mg tablet  TAKE 1 TABLET BY MOUTH THREE TIMES DAILY AS  NEEDED   Lactobacillus Rhamnosus, GG, (CULTURELLE PO) Take 1 capsule by mouth daily.    metFORMIN (GLUCOPHAGE) 500 MG tablet TAKE 1 TABLET BY MOUTH TWICE DAILY WITH A MEAL   omeprazole (PRILOSEC) 20 MG capsule Take 1 capsule (20 mg total) by mouth 2 (two) times daily before a meal.   rosuvastatin (CRESTOR) 10 MG tablet rosuvastatin 10 mg tablet  TAKE 1 TABLET BY MOUTH EVERY DAY   vitamin B-12 (CYANOCOBALAMIN) 1000 MCG tablet Take 1,000 mcg by mouth daily.   [DISCONTINUED] glipiZIDE (GLUCOTROL XL) 5 MG 24 hr tablet TAKE ONE TABLET BY MOUTH EVERY DAY WITH BREAKFAST   [DISCONTINUED] HYDROcodone-acetaminophen (NORCO/VICODIN) 5-325 MG tablet Take 1 tablet by mouth. No longer prescribed by Dr. Lovena Le   [DISCONTINUED] metFORMIN (GLUCOPHAGE) 500 MG tablet Take 1 tablet (500 mg total) by mouth 2 (two) times daily with a meal.   No facility-administered encounter medications on file as of 10/13/2020.     Review of Systems  Constitutional: Negative for chills and fever.  HENT: Negative for congestion, rhinorrhea and sore throat.   Respiratory: Negative for cough, shortness of breath and wheezing.   Cardiovascular: Negative for chest pain and leg swelling.  Gastrointestinal: Negative for abdominal pain, diarrhea, nausea and vomiting.  Genitourinary: Negative for dysuria and frequency.  Musculoskeletal: Negative for arthralgias and back pain.  Skin: Negative for rash.  Neurological: Negative for dizziness, weakness and headaches.     Vitals BP 128/78    Pulse 90  Temp 97.6 F (36.4 C)    Wt 188 lb 9.6 oz (85.5 kg)    SpO2 98%    BMI 30.44 kg/m   Objective:   Physical Exam Vitals and nursing note reviewed.  Constitutional:      Appearance: Normal appearance.  HENT:     Head: Normocephalic and atraumatic.     Nose: Nose normal.     Mouth/Throat:     Mouth: Mucous membranes are moist.     Pharynx: Oropharynx is clear.  Eyes:     Extraocular Movements: Extraocular movements  intact.     Conjunctiva/sclera: Conjunctivae normal.     Pupils: Pupils are equal, round, and reactive to light.  Cardiovascular:     Rate and Rhythm: Normal rate and regular rhythm.     Pulses: Normal pulses.     Heart sounds: Normal heart sounds.  Pulmonary:     Effort: Pulmonary effort is normal.     Breath sounds: Normal breath sounds. No wheezing, rhonchi or rales.  Musculoskeletal:        General: Normal range of motion.     Right lower leg: No edema.     Left lower leg: No edema.  Skin:    General: Skin is warm and dry.     Findings: No lesion or rash.  Neurological:     General: No focal deficit present.     Mental Status: She is alert and oriented to person, place, and time.  Psychiatric:        Mood and Affect: Mood normal.        Behavior: Behavior normal.      Assessment and Plan   1. Type 2 diabetes mellitus without complication, without long-term current use of insulin (HCC) - CBC - CMP14+EGFR - Hemoglobin A1c - Lipid panel - Microalbumin, urine   Pt h/o chronic pain and insomnia- seeing Neurology.  DM2- needing labs rechecked.  Then will order refills. Pt will call for diabetic eye exam.  Pt to get labs  In the next 1-2 wks.  F/u [address] prn.

## 2020-10-19 ENCOUNTER — Other Ambulatory Visit: Payer: Self-pay | Admitting: Nurse Practitioner

## 2020-11-15 ENCOUNTER — Other Ambulatory Visit: Payer: Self-pay | Admitting: Nurse Practitioner

## 2020-11-20 LAB — CMP14+EGFR
ALT: 19 IU/L (ref 0–32)
AST: 16 IU/L (ref 0–40)
Albumin/Globulin Ratio: 2.2 (ref 1.2–2.2)
Albumin: 4.6 g/dL (ref 3.8–4.8)
Alkaline Phosphatase: 59 IU/L (ref 44–121)
BUN/Creatinine Ratio: 16 (ref 12–28)
BUN: 13 mg/dL (ref 8–27)
Bilirubin Total: 0.3 mg/dL (ref 0.0–1.2)
CO2: 26 mmol/L (ref 20–29)
Calcium: 9.3 mg/dL (ref 8.7–10.3)
Chloride: 104 mmol/L (ref 96–106)
Creatinine, Ser: 0.81 mg/dL (ref 0.57–1.00)
GFR calc Af Amer: 89 mL/min/{1.73_m2} (ref >59)
GFR calc non Af Amer: 77 mL/min/{1.73_m2} (ref >59)
Globulin, Total: 2.1 g/dL (ref 1.5–4.5)
Glucose: 100 mg/dL — ABNORMAL HIGH (ref 65–99)
Potassium: 4.5 mmol/L (ref 3.5–5.2)
Sodium: 146 mmol/L — ABNORMAL HIGH (ref 134–144)
Total Protein: 6.7 g/dL (ref 6.0–8.5)

## 2020-11-20 LAB — LIPID PANEL
Chol/HDL Ratio: 5 ratio — ABNORMAL HIGH (ref 0.0–4.4)
Cholesterol, Total: 273 mg/dL — ABNORMAL HIGH (ref 100–199)
HDL: 55 mg/dL (ref >39)
LDL Chol Calc (NIH): 195 mg/dL — ABNORMAL HIGH (ref 0–99)
Triglycerides: 130 mg/dL (ref 0–149)
VLDL Cholesterol Cal: 23 mg/dL (ref 5–40)

## 2020-11-20 LAB — CBC
Hematocrit: 40.7 % (ref 34.0–46.6)
Hemoglobin: 13.3 g/dL (ref 11.1–15.9)
MCH: 27.7 pg (ref 26.6–33.0)
MCHC: 32.7 g/dL (ref 31.5–35.7)
MCV: 85 fL (ref 79–97)
Platelets: 267 10*3/uL (ref 150–450)
RBC: 4.8 x10E6/uL (ref 3.77–5.28)
RDW: 13.1 % (ref 11.7–15.4)
WBC: 7.2 10*3/uL (ref 3.4–10.8)

## 2020-11-20 LAB — HEMOGLOBIN A1C
Est. average glucose Bld gHb Est-mCnc: 126 mg/dL
Hgb A1c MFr Bld: 6 % — ABNORMAL HIGH (ref 4.8–5.6)

## 2020-11-20 LAB — MICROALBUMIN, URINE: Microalbumin, Urine: 15.6 ug/mL

## 2020-11-25 ENCOUNTER — Other Ambulatory Visit: Payer: Self-pay | Admitting: Nurse Practitioner

## 2020-11-26 ENCOUNTER — Ambulatory Visit (HOSPITAL_COMMUNITY): Payer: 59 | Attending: Sports Medicine | Admitting: Physical Therapy

## 2020-11-26 ENCOUNTER — Other Ambulatory Visit: Payer: Self-pay

## 2020-11-26 ENCOUNTER — Encounter (HOSPITAL_COMMUNITY): Payer: Self-pay | Admitting: Physical Therapy

## 2020-11-26 DIAGNOSIS — R293 Abnormal posture: Secondary | ICD-10-CM | POA: Diagnosis present

## 2020-11-26 DIAGNOSIS — M546 Pain in thoracic spine: Secondary | ICD-10-CM | POA: Insufficient documentation

## 2020-11-26 MED ORDER — ROSUVASTATIN CALCIUM 10 MG PO TABS: ORAL_TABLET | ORAL | 0 refills | Status: DC

## 2020-11-26 NOTE — Addendum Note (Signed)
Addended by: Dairl Ponder on: 11/26/2020 01:11 PM   Modules accepted: Orders

## 2020-11-26 NOTE — Patient Instructions (Addendum)
Trigger Point Dry Needling  . What is Trigger Point Dry Needling (DN)? o DN is a physical therapy technique used to treat muscle pain and dysfunction. Specifically, DN helps deactivate muscle trigger points (muscle knots).  o A thin filiform needle is used to penetrate the skin and stimulate the underlying trigger point. The goal is for a local twitch response (LTR) to occur and for the trigger point to relax. No medication of any kind is injected during the procedure.   . What Does Trigger Point Dry Needling Feel Like?  o The procedure feels different for each individual patient. Some patients report that they do not actually feel the needle enter the skin and overall the process is not painful. Very mild bleeding may occur. However, many patients feel a deep cramping in the muscle in which the needle was inserted. This is the local twitch response.   Marland Kitchen How Will I feel after the treatment? o Soreness is normal, and the onset of soreness may not occur for a few hours. Typically this soreness does not last longer than two days.  o Bruising is uncommon, however; ice can be used to decrease any possible bruising.  o In rare cases feeling tired or nauseous after the treatment is normal. In addition, your symptoms may get worse before they get better, this period will typically not last longer than 24 hours.   . What Can I do After My Treatment? o Increase your hydration by drinking more water for the next 24 hours. o You may place ice or heat on the areas treated that have become sore, however, do not use heat on inflamed or bruised areas. Heat often brings more relief post needling. o You can continue your regular activities, but vigorous activity is not recommended initially after the treatment for 24 hours. o DN is best combined with other physical therapy such as strengthening, stretching, and other therapies.    Access Code: [email or web address] URL: [email or web address] Date:  11/26/2020 Prepared by: Josue Hector  Exercises Correct Seated Posture - 2 x daily - 7 x weekly - 1 sets - 10 reps - 5 seconds hold Seated Scapular Retraction - 2 x daily - 7 x weekly - 1-2 sets - 10 reps - 5 seconds hold Seated Cervical Retraction - 2 x daily - 7 x weekly - 1-2 sets - 10 reps - 5 seconds hold Seated Upper Trapezius Stretch - 2 x daily - 7 x weekly - 1 sets - 3 reps - 30 seconds hold

## 2020-11-26 NOTE — Therapy (Signed)
[place], [place], [address] Phone: [phone]   Fax:  [phone]  Physical Therapy Evaluation  Patient Details  Name: Catherine Moore MRN: [id number] Date of Birth: 05-21-1956 Referring Provider (PT): Wandra Feinstein MD   Encounter Date: 11/26/2020   PT End of Session - 11/26/20 1648    Visit Number 1    Number of Visits 8    Date for PT Re-Evaluation 12/24/20    Authorization Type [employer] (No auth, 30 VL)    Authorization - Visit Number 1    Authorization - Number of Visits 30    PT Start Time 3532    PT Stop Time 1640    PT Time Calculation (min) 55 min    Activity Tolerance Patient tolerated treatment well    Behavior During Therapy [facility] for tasks assessed/performed           Past Medical History:  Diagnosis Date  . Anxiety   . Arthritis   . Cirrhosis ([place])   . Complication of anesthesia   . Depression   . Diabetes ([place])   . GERD (gastroesophageal reflux disease)   . Hyperlipidemia   . PONV (postoperative nausea and vomiting)     Past Surgical History:  Procedure Laterality Date  . BIOPSY  08/16/2016   Procedure: BIOPSY;  Surgeon: Daneil Dolin, MD;  [facility];  Service: Endoscopy;;  . BIOPSY  08/16/2018   Procedure: BIOPSY;  Surgeon: Daneil Dolin, MD;  [facility];  Service: Endoscopy;;  gastric  . COLONOSCOPY  08/09/2010   DJM:EQASTM rectum colon  . COLONOSCOPY WITH PROPOFOL N/A 04/07/2020   Procedure: COLONOSCOPY WITH PROPOFOL;  Surgeon: Daneil Dolin, MD;  [facility];  Service: Endoscopy;  Laterality: N/A;  7:30am  . ENDOMETRIAL ABLATION    . ESOPHAGOGASTRODUODENOSCOPY N/A 08/16/2016   Dr. Gala Romney: normal esophagus, GAVE, multiple gastric polyps s/p biopsy (hyperplastic), normal duodenum   . ESOPHAGOGASTRODUODENOSCOPY (EGD) WITH PROPOFOL N/A 08/16/2018   Rourk: Multiple gastric polyps, pathology revealed fundic gland, gastric biopsies  negative for H. pylori.  . IR GENERIC HISTORICAL  06/13/2016   IR TRANSCATHETER BX 06/13/2016 [facility]  . IR GENERIC HISTORICAL  06/13/2016   IR US GUIDE VASC ACCESS RIGHT 06/13/2016 [facility]  . IR GENERIC HISTORICAL  06/13/2016   IR VENOGRAM HEPATIC W HEMODYNAMIC EVALUATION 06/13/2016 [facility]  . POLYPECTOMY  04/07/2020   Procedure: POLYPECTOMY;  Surgeon: Daneil Dolin, MD;  [facility];  Service: Endoscopy;;    There were no vitals filed for this visit.    Subjective Assessment - 11/26/20 1551    Subjective Patient presents to physical therapy with complaint of mid back and neck pain. She notes in the last 4 months she began having increasing scapular/ thoracic pain. She had xrays which showed arthritis in her neck and was instructed to begin physical therapy for stretching and strengthening exercise to avoid poor slouching posture.    Limitations Lifting;Standing;House hold activities    How long can you stand comfortably? 2 hours    Diagnostic tests xrays    Patient Stated Goals not to be hump back    Currently in Pain? Yes    Pain Score 6     Pain Location Thoracic    Pain Orientation Posterior;Mid    Pain Descriptors / Indicators Aching;Dull;Constant    Pain Type Acute pain    Pain Onset More than a month  ago    Pain Frequency Intermittent    Aggravating Factors  standing, lifting    Pain Relieving Factors rest, meds, laying flat, sitting in recliner    Effect of Pain on Daily Activities Limits              [facility] PT Assessment - 11/26/20 0001      Assessment   Medical Diagnosis Periscapular pai, kyphosis, cervical DDD    Referring Provider (PT) Wandra Feinstein MD    Prior Therapy No      Precautions   Precautions None      Restrictions   Weight Bearing Restrictions No      [address] residence      Prior Function   Level of Independence Independent      Cognition   Overall Cognitive Status Within  Functional Limits for tasks assessed      Observation/Other Assessments   Focus on Therapeutic Outcomes (FOTO)  52% function      ROM / Strength   AROM / PROM / Strength AROM;Strength      AROM   Overall AROM Comments Bilateral shoulder AROM WFL    AROM Assessment Site Cervical    Cervical Flexion WFL    Cervical Extension 22    Cervical - Right Rotation 45   pain   Cervical - Left Rotation 53   pain     Flexibility   Soft Tissue Assessment /Muscle Length --   Mod restriction in bilateral upper trapezius with pain     Palpation   Palpation comment Mod TTP about bilateral upper traps, RT periscapular (rhomboids) area                      Objective measurements completed on examination: See above findings.       [facility] Adult PT Treatment/Exercise - 11/26/20 0001      Exercises   Exercises Neck      Neck Exercises: Seated   Neck Retraction 10 reps    Other Seated Exercise scapular retraction 10 x 5"    Other Seated Exercise posture corrections x5      Neck Exercises: Stretches   Upper Trapezius Stretch Right;Left;2 reps;30 seconds                  PT Education - 11/26/20 1554    Education Details On evaluation findings, POC, and HEP    Person(s) Educated Patient    Methods Explanation;Handout    Comprehension Verbalized understanding            PT Short Term Goals - 11/26/20 1652      PT SHORT TERM GOAL #1   Title Patient will be independent with initial HEP and self-management strategies to improve functional outcomes    Time 2    Period Weeks    Status New    Target Date 12/10/20             PT Long Term Goals - 11/26/20 1653      PT LONG TERM GOAL #1   Title Patient will improve FOTO score by 10% to indicate improvement in functional outcomes    Time 4    Period Weeks    Status New    Target Date 12/24/20      PT LONG TERM GOAL #2   Title Patient will report at least 75% overall improvement in subjective complaint to  indicate improvement in ability to perform ADLs.  Time 4    Period Weeks    Status New    Target Date 12/24/20      PT LONG TERM GOAL #3   Title Patient improve RT cervical rotation by at least 5 degrees in order to improve ability to scan environment for safety and while driving.    Time 4    Period Weeks    Status New    Target Date 12/24/20      PT LONG TERM GOAL #4   Title Patient will be able to stand at work up to 6-8 hours with mid back pain not to exceed 3/10 for improved ability to perform work tasks.    Time 4    Period Weeks    Status New    Target Date 12/24/20                  Plan - 11/26/20 1649    Clinical Impression Statement Patient is a 65 y.o. female who presents to physical therapy with complaint of thoracic/ periscapular pain. Patient demonstrates ROM restriction, reduced flexibility, increased tenderness to palpation and postural abnormalities which are likely contributing to symptoms of pain and are negatively impacting patient ability to perform ADLs. Patient will benefit from skilled physical therapy services to address these deficits to reduce pain and improve level of function with ADLs    Examination-Activity Limitations Carry;Lift;Stand    Examination-Participation Restrictions Yard Work;Cleaning;Community Activity;Occupation;Laundry    Stability/Clinical Decision Making Stable/Uncomplicated    Clinical Decision Making Low    Rehab Potential Good    PT Frequency 2x / week    PT Duration 4 weeks    PT Treatment/Interventions ADLs/Self Care Home Management;Vestibular;Scar mobilization;Balance training;DME Instruction;Iontophoresis 40m/ml Dexamethasone;Neuromuscular re-education;Gait training;Passive range of motion;Manual techniques;Functional mobility training;Stair training;Dry needling;Moist Heat;Aquatic Therapy;Traction;Biofeedback;Ultrasound;Canalith Repostioning;Parrafin;Therapeutic activities;Fluidtherapy;Cryotherapy;Electrical  Stimulation;Contrast Bath;Therapeutic exercise;Patient/family education;Orthotic Fit/Training;Manual lymph drainage;Compression bandaging;Energy conservation;Splinting;Spinal Manipulations;Joint Manipulations;Taping;Vasopneumatic Device;Visual/perceptual remediation/compensation    PT Next Visit Plan Review goals and HEP, f/u about dry needling, perform if indicated. Manuals as needed to address pain and restriciton. Progress postural strength. Add thoracic extension and band rows.    PT Home Exercise Plan Eval: posture corrections, chin tuck, scpaular retraction, upper trap stretching    Consulted and Agree with Plan of Care Patient           Patient will benefit from skilled therapeutic intervention in order to improve the following deficits and impairments:  Pain,Increased fascial restricitons,Postural dysfunction,Decreased range of motion,Impaired flexibility  Visit Diagnosis: Pain in thoracic spine  Abnormal posture     Problem List Patient Active Problem List   Diagnosis Date Noted  . Left sided abdominal pain 02/19/2020  . Diarrhea 02/19/2020  . Leukocytosis 02/19/2020  . LUQ pain 07/24/2018  . NASH (nonalcoholic steatohepatitis) 03/01/2017  . Liver cirrhosis secondary to NASH ([place] 08/03/2016  . Elevated ferritin 08/03/2016  . Hemochromatosis 01/05/2016  . Elevated liver enzymes 01/05/2016  . Arthralgia 01/05/2016  . Primary osteoarthritis of first carpometacarpal joint of left hand 10/22/2015  . Type 2 diabetes mellitus without complication, without long-term current use of insulin ([place] 10/16/2015  . Thumb tendonitis 10/06/2015  . Left wrist tendonitis 10/06/2015  . Humpback 10/06/2015  . Encounter for long-term opiate analgesic use 07/06/2015  . Arthritis of knee, left 01/20/2015  . Morbid obesity ([place] 12/30/2013  . GERD (gastroesophageal reflux disease) 09/27/2013  . Pain management 09/27/2013  . Anxiety and depression 09/27/2013  . Hyperlipemia 02/15/2013  .  Urinary incontinence in female 02/15/2013  . RECTAL BLEEDING 07/19/2010  4:59 PM, 11/26/20 Josue Hector PT DPT  Physical Therapist with [facility]  [phone] [place], [place], [address] Phone: [phone]   Fax:  [phone]  Name: Catherine Moore MRN: [id number] Date of Birth: 01-Oct-1956

## 2020-12-08 ENCOUNTER — Other Ambulatory Visit: Payer: Self-pay

## 2020-12-08 ENCOUNTER — Other Ambulatory Visit: Payer: Self-pay | Admitting: Nurse Practitioner

## 2020-12-08 ENCOUNTER — Ambulatory Visit (HOSPITAL_COMMUNITY): Payer: 59 | Admitting: Physical Therapy

## 2020-12-08 ENCOUNTER — Other Ambulatory Visit: Payer: Self-pay | Admitting: Family Medicine

## 2020-12-08 ENCOUNTER — Encounter (HOSPITAL_COMMUNITY): Payer: Self-pay | Admitting: Physical Therapy

## 2020-12-08 DIAGNOSIS — R293 Abnormal posture: Secondary | ICD-10-CM

## 2020-12-08 DIAGNOSIS — M546 Pain in thoracic spine: Secondary | ICD-10-CM

## 2020-12-08 NOTE — Therapy (Signed)
[place], [place], [address] Phone: [phone]   Fax:  [phone]  Physical Therapy Treatment  Patient Details  Name: Catherine Moore MRN: [id number] Date of Birth: 11/29/1955 Referring Provider (PT): Wandra Feinstein MD   Encounter Date: 12/08/2020   PT End of Session - 12/08/20 1712    Visit Number 2    Number of Visits 8    Date for PT Re-Evaluation 12/24/20    Authorization Type [employer] (No auth, 30 VL)    Authorization - Visit Number 2    Authorization - Number of Visits 30    PT Start Time 8527    PT Stop Time 1705    PT Time Calculation (min) 50 min    Activity Tolerance Patient tolerated treatment well    Behavior During Therapy [facility] for tasks assessed/performed           Past Medical History:  Diagnosis Date  . Anxiety   . Arthritis   . Cirrhosis ([place])   . Complication of anesthesia   . Depression   . Diabetes ([place])   . GERD (gastroesophageal reflux disease)   . Hyperlipidemia   . PONV (postoperative nausea and vomiting)     Past Surgical History:  Procedure Laterality Date  . BIOPSY  08/16/2016   Procedure: BIOPSY;  Surgeon: Daneil Dolin, MD;  [facility];  Service: Endoscopy;;  . BIOPSY  08/16/2018   Procedure: BIOPSY;  Surgeon: Daneil Dolin, MD;  [facility];  Service: Endoscopy;;  gastric  . COLONOSCOPY  08/09/2010   POE:UMPNTI rectum colon  . COLONOSCOPY WITH PROPOFOL N/A 04/07/2020   Procedure: COLONOSCOPY WITH PROPOFOL;  Surgeon: Daneil Dolin, MD;  [facility];  Service: Endoscopy;  Laterality: N/A;  7:30am  . ENDOMETRIAL ABLATION    . ESOPHAGOGASTRODUODENOSCOPY N/A 08/16/2016   Dr. Gala Romney: normal esophagus, GAVE, multiple gastric polyps s/p biopsy (hyperplastic), normal duodenum   . ESOPHAGOGASTRODUODENOSCOPY (EGD) WITH PROPOFOL N/A 08/16/2018   Rourk: Multiple gastric polyps, pathology revealed fundic gland, gastric biopsies  negative for H. pylori.  . IR GENERIC HISTORICAL  06/13/2016   IR TRANSCATHETER BX 06/13/2016 [facility]  . IR GENERIC HISTORICAL  06/13/2016   IR US GUIDE VASC ACCESS RIGHT 06/13/2016 [facility]  . IR GENERIC HISTORICAL  06/13/2016   IR VENOGRAM HEPATIC W HEMODYNAMIC EVALUATION 06/13/2016 [facility]  . POLYPECTOMY  04/07/2020   Procedure: POLYPECTOMY;  Surgeon: Daneil Dolin, MD;  [facility];  Service: Endoscopy;;    There were no vitals filed for this visit.   Subjective Assessment - 12/08/20 1712    Subjective States that her back is still bothering her and is about 8/10 pain in her mid back states that the neck hurts some.    Limitations Lifting;Standing;House hold activities    How long can you stand comfortably? 2 hours    Diagnostic tests xrays    Patient Stated Goals not to be hump back    Currently in Pain? Yes    Pain Score 8     Pain Location Back    Pain Orientation Mid;Posterior    Pain Onset More than a month ago              [facility] PT Assessment - 12/08/20 0001      Assessment   Medical Diagnosis Periscapular pai, kyphosis, cervical DDD    Referring Provider (PT) Wandra Feinstein MD  AROM   AROM Assessment Site Shoulder    Right/Left Shoulder Right;Left    Right Shoulder Flexion 170 Degrees   pain in shoulder   Right Shoulder ABduction 180 Degrees   pain in shoulder   Right Shoulder Internal Rotation 60 Degrees   at 90 abd - pain in shoulder   Right Shoulder External Rotation 70 Degrees   at 90 abd - pain in shoulder   Left Shoulder Flexion 180 Degrees    Left Shoulder ABduction 180 Degrees    Left Shoulder Internal Rotation 80 Degrees    Left Shoulder External Rotation 90 Degrees                         [facility] Adult PT Treatment/Exercise - 12/08/20 0001      Neck Exercises: Supine   Other Supine Exercise breath work - long exhale focus on lower rib decompression - 8 minutes practice    Other Supine Exercise revieewd  HEP exercises      Manual Therapy   Manual Therapy Joint mobilization;Soft tissue mobilization    Manual therapy comments all manual interventions performed independently of other interventions    Joint Mobilization PAs to T-spine grade II, UPAs to ribs grade II -performed bilaterally focus on right side - tollerated mildely well    Soft tissue mobilization STM to R lats, rhomboids, paraspinals, and trapezius            Trigger Point Dry Needling - 12/08/20 0001    Consent Given? Yes    Education Handout Provided Yes    Muscles Treated Upper Quadrant Latissimus dorsi    Dry Needling Comments needled right lat in supine- 2 needles    Latissimus dorsi Response Palpable increased muscle length                PT Education - 12/08/20 1711    Education Details on dry needling, on anatomy and how it relates to symptoms, on HEP, on how breath work can help with symptoms    Person(s) Educated Patient    Methods Explanation    Comprehension Verbalized understanding            PT Short Term Goals - 11/26/20 1652      PT SHORT TERM GOAL #1   Title Patient will be independent with initial HEP and self-management strategies to improve functional outcomes    Time 2    Period Weeks    Status New    Target Date 12/10/20             PT Long Term Goals - 11/26/20 1653      PT LONG TERM GOAL #1   Title Patient will improve FOTO score by 10% to indicate improvement in functional outcomes    Time 4    Period Weeks    Status New    Target Date 12/24/20      PT LONG TERM GOAL #2   Title Patient will report at least 75% overall improvement in subjective complaint to indicate improvement in ability to perform ADLs.    Time 4    Period Weeks    Status New    Target Date 12/24/20      PT LONG TERM GOAL #3   Title Patient improve RT cervical rotation by at least 5 degrees in order to improve ability to scan environment for safety and while driving.    Time 4    Period Weeks  Status New    Target Date 12/24/20      PT LONG TERM GOAL #4   Title Patient will be able to stand at work up to 6-8 hours with mid back pain not to exceed 3/10 for improved ability to perform work tasks.    Time 4    Period Weeks    Status New    Target Date 12/24/20                 Plan - 12/08/20 1713    Clinical Impression Statement Patient with notable restrictions in thoracic and rib mobility, tolerated mobilizations moderately well. Session focused on education and review of exercises as well as added additional breathing exercises to HEP. Patient with muscle guarding all along right trunk causing patient to hold herself in right rotation and shoulder in internal rotation. Added dry needling and patient tolerated this well with reported reduced tension in shoulder noted afterwards. Will continue with current POC as tolerated.    Examination-Activity Limitations Carry;Lift;Stand    Examination-Participation Restrictions Yard Work;Cleaning;Community Activity;Occupation;Laundry    Stability/Clinical Decision Making Stable/Uncomplicated    Rehab Potential Good    PT Frequency 2x / week    PT Duration 4 weeks    PT Treatment/Interventions ADLs/Self Care Home Management;Vestibular;Scar mobilization;Balance training;DME Instruction;Iontophoresis 23m/ml Dexamethasone;Neuromuscular re-education;Gait training;Passive range of motion;Manual techniques;Functional mobility training;Stair training;Dry needling;Moist Heat;Aquatic Therapy;Traction;Biofeedback;Ultrasound;Canalith Repostioning;Parrafin;Therapeutic activities;Fluidtherapy;Cryotherapy;Electrical Stimulation;Contrast Bath;Therapeutic exercise;Patient/family education;Orthotic Fit/Training;Manual lymph drainage;Compression bandaging;Energy conservation;Splinting;Spinal Manipulations;Joint Manipulations;Taping;Vasopneumatic Device;Visual/perceptual remediation/compensation    PT Next Visit Plan dry needling, perform if indicated.  Manuals as needed to address pain and restriciton. Progress postural strength. Add thoracic mobilityand band rows.    PT Home Exercise Plan Eval: posture corrections, chin tuck, scpaular retraction, upper trap stretching    Consulted and Agree with Plan of Care Patient           Patient will benefit from skilled therapeutic intervention in order to improve the following deficits and impairments:  Pain,Increased fascial restricitons,Postural dysfunction,Decreased range of motion,Impaired flexibility  Visit Diagnosis: Pain in thoracic spine  Abnormal posture     Problem List Patient Active Problem List   Diagnosis Date Noted  . Left sided abdominal pain 02/19/2020  . Diarrhea 02/19/2020  . Leukocytosis 02/19/2020  . LUQ pain 07/24/2018  . NASH (nonalcoholic steatohepatitis) 03/01/2017  . Liver cirrhosis secondary to NASH ([place] 08/03/2016  . Elevated ferritin 08/03/2016  . Hemochromatosis 01/05/2016  . Elevated liver enzymes 01/05/2016  . Arthralgia 01/05/2016  . Primary osteoarthritis of first carpometacarpal joint of left hand 10/22/2015  . Type 2 diabetes mellitus without complication, without long-term current use of insulin ([place] 10/16/2015  . Thumb tendonitis 10/06/2015  . Left wrist tendonitis 10/06/2015  . Humpback 10/06/2015  . Encounter for long-term opiate analgesic use 07/06/2015  . Arthritis of knee, left 01/20/2015  . Morbid obesity ([place] 12/30/2013  . GERD (gastroesophageal reflux disease) 09/27/2013  . Pain management 09/27/2013  . Anxiety and depression 09/27/2013  . Hyperlipemia 02/15/2013  . Urinary incontinence in female 02/15/2013  . RECTAL BLEEDING 07/19/2010   5:18 PM, 12/08/20 MJerene Pitch DPT [facility] [phone]  [place] [address]Phone: [phone]  Fax:  [phone] Name: Catherine YETTERMRN:  [id number] of Birth: [birth year]

## 2020-12-10 ENCOUNTER — Telehealth (HOSPITAL_COMMUNITY): Payer: Self-pay | Admitting: Physical Therapy

## 2020-12-10 ENCOUNTER — Ambulatory Visit (HOSPITAL_COMMUNITY): Payer: 59 | Admitting: Physical Therapy

## 2020-12-10 NOTE — Telephone Encounter (Signed)
Called patient about missed appointment today. She thought it was scheduled for tomorrow. Offered later appointment today, she is unable to make it. Reminded of next appointment 12/15/20.   4:04 PM, 12/10/20 Josue Hector PT DPT  Physical Therapist with [facility]  [phone]

## 2020-12-15 ENCOUNTER — Ambulatory Visit (HOSPITAL_COMMUNITY): Payer: 59 | Attending: Sports Medicine | Admitting: Physical Therapy

## 2020-12-15 ENCOUNTER — Encounter (HOSPITAL_COMMUNITY): Payer: Self-pay | Admitting: Physical Therapy

## 2020-12-15 ENCOUNTER — Other Ambulatory Visit: Payer: Self-pay

## 2020-12-15 DIAGNOSIS — R293 Abnormal posture: Secondary | ICD-10-CM | POA: Insufficient documentation

## 2020-12-15 DIAGNOSIS — M546 Pain in thoracic spine: Secondary | ICD-10-CM | POA: Insufficient documentation

## 2020-12-15 NOTE — Therapy (Signed)
[place], [place], [address] Phone: [phone]   Fax:  [phone]  Physical Therapy Treatment  Patient Details  Name: Catherine Moore MRN: [id number] Date of Birth: 12-06-1955 Referring Provider (PT): Wandra Feinstein MD   Encounter Date: 12/15/2020   PT End of Session - 12/15/20 1525    Visit Number 3    Number of Visits 8    Date for PT Re-Evaluation 12/24/20    Authorization Type [employer] (No auth, 30 VL)    Authorization - Visit Number 3    Authorization - Number of Visits 30    PT Start Time 8016    PT Stop Time 1558    PT Time Calculation (min) 40 min    Activity Tolerance Patient tolerated treatment well    Behavior During Therapy [facility] for tasks assessed/performed           Past Medical History:  Diagnosis Date  . Anxiety   . Arthritis   . Cirrhosis ([place])   . Complication of anesthesia   . Depression   . Diabetes ([place])   . GERD (gastroesophageal reflux disease)   . Hyperlipidemia   . PONV (postoperative nausea and vomiting)     Past Surgical History:  Procedure Laterality Date  . BIOPSY  08/16/2016   Procedure: BIOPSY;  Surgeon: Daneil Dolin, MD;  [facility];  Service: Endoscopy;;  . BIOPSY  08/16/2018   Procedure: BIOPSY;  Surgeon: Daneil Dolin, MD;  [facility];  Service: Endoscopy;;  gastric  . COLONOSCOPY  08/09/2010   PVV:ZSMOLM rectum colon  . COLONOSCOPY WITH PROPOFOL N/A 04/07/2020   Procedure: COLONOSCOPY WITH PROPOFOL;  Surgeon: Daneil Dolin, MD;  [facility];  Service: Endoscopy;  Laterality: N/A;  7:30am  . ENDOMETRIAL ABLATION    . ESOPHAGOGASTRODUODENOSCOPY N/A 08/16/2016   Dr. Gala Romney: normal esophagus, GAVE, multiple gastric polyps s/p biopsy (hyperplastic), normal duodenum   . ESOPHAGOGASTRODUODENOSCOPY (EGD) WITH PROPOFOL N/A 08/16/2018   Rourk: Multiple gastric polyps, pathology revealed fundic gland, gastric biopsies  negative for H. pylori.  . IR GENERIC HISTORICAL  06/13/2016   IR TRANSCATHETER BX 06/13/2016 [facility]  . IR GENERIC HISTORICAL  06/13/2016   IR US GUIDE VASC ACCESS RIGHT 06/13/2016 [facility]  . IR GENERIC HISTORICAL  06/13/2016   IR VENOGRAM HEPATIC W HEMODYNAMIC EVALUATION 06/13/2016 [facility]  . POLYPECTOMY  04/07/2020   Procedure: POLYPECTOMY;  Surgeon: Daneil Dolin, MD;  [facility];  Service: Endoscopy;;    There were no vitals filed for this visit.   Subjective Assessment - 12/15/20 1525    Subjective Patient says she feels a little better. Needling helped a little but she still has pain. HEP is "fine".    Limitations Lifting;Standing;House hold activities    How long can you stand comfortably? 2 hours    Diagnostic tests xrays    Patient Stated Goals not to be hump back    Currently in Pain? Yes    Pain Score 5     Pain Location Thoracic    Pain Orientation Mid;Posterior    Pain Descriptors / Indicators Aching;Constant    Pain Type Acute pain    Pain Onset More than a month ago    Pain Frequency Intermittent  [facility] Adult PT Treatment/Exercise - 12/15/20 0001      Neck Exercises: Seated   Neck Retraction 10 reps;5 secs    Other Seated Exercise scapular retraction 10 x 5"      Neck Exercises: Supine   Other Supine Exercise thoracic extension with 1/2 foam roll x15, supine pec stretch with vertical roll 4 minutes end of session      Manual Therapy   Manual Therapy Joint mobilization    Manual therapy comments all manual interventions performed independently of other interventions    Joint Mobilization PA thoracic mobs grade II T4-T10, patient in prone    Soft tissue mobilization STM to RT upper trap pre and post dry needling      Neck Exercises: Stretches   Upper Trapezius Stretch Right;Left;3 reps;30 seconds            Trigger Point Dry Needling - 12/15/20 0001    Consent Given? Yes     Education Handout Provided Previously provided    Muscles Treated Head and Neck Upper trapezius    Dry Needling Comments 1 needle x 2 in RT upper trapezius, patient in prone, tolerated well    Latissimus dorsi Response Palpable increased muscle length                  PT Short Term Goals - 11/26/20 1652      PT SHORT TERM GOAL #1   Title Patient will be independent with initial HEP and self-management strategies to improve functional outcomes    Time 2    Period Weeks    Status New    Target Date 12/10/20             PT Long Term Goals - 11/26/20 1653      PT LONG TERM GOAL #1   Title Patient will improve FOTO score by 10% to indicate improvement in functional outcomes    Time 4    Period Weeks    Status New    Target Date 12/24/20      PT LONG TERM GOAL #2   Title Patient will report at least 75% overall improvement in subjective complaint to indicate improvement in ability to perform ADLs.    Time 4    Period Weeks    Status New    Target Date 12/24/20      PT LONG TERM GOAL #3   Title Patient improve RT cervical rotation by at least 5 degrees in order to improve ability to scan environment for safety and while driving.    Time 4    Period Weeks    Status New    Target Date 12/24/20      PT LONG TERM GOAL #4   Title Patient will be able to stand at work up to 6-8 hours with mid back pain not to exceed 3/10 for improved ability to perform work tasks.    Time 4    Period Weeks    Status New    Target Date 12/24/20                 Plan - 12/15/20 1717    Clinical Impression Statement Patient with improved tolerance to manual mobilizations. Added self-thoracic extension mobs and supine pec stretch. Patient cued on proper form and education on function. Patient tolerated this well. Performed dry needling to palpable trigger points on RT side, patient tolerated well and noting decreased pain level post session. Issued updated HEP. Patient will continue  to  benefit from postural strengthening and cervicothoracic mobilization progressions for reduced pain and improved posture    Examination-Activity Limitations Carry;Lift;Stand    Examination-Participation Restrictions Yard Work;Cleaning;Community Activity;Occupation;Laundry    Stability/Clinical Decision Making Stable/Uncomplicated    Rehab Potential Good    PT Frequency 2x / week    PT Duration 4 weeks    PT Treatment/Interventions ADLs/Self Care Home Management;Vestibular;Scar mobilization;Balance training;DME Instruction;Iontophoresis 37m/ml Dexamethasone;Neuromuscular re-education;Gait training;Passive range of motion;Manual techniques;Functional mobility training;Stair training;Dry needling;Moist Heat;Aquatic Therapy;Traction;Biofeedback;Ultrasound;Canalith Repostioning;Parrafin;Therapeutic activities;Fluidtherapy;Cryotherapy;Electrical Stimulation;Contrast Bath;Therapeutic exercise;Patient/family education;Orthotic Fit/Training;Manual lymph drainage;Compression bandaging;Energy conservation;Splinting;Spinal Manipulations;Joint Manipulations;Taping;Vasopneumatic Device;Visual/perceptual remediation/compensation    PT Next Visit Plan dry needling, perform if indicated. Manuals as needed to address pain and restriciton. Progress postural strength. Add thoracic mobilityand band rows.    PT Home Exercise Plan Eval: posture corrections, chin tuck, scpaular retraction, upper trap stretching 3/1 supine pec stretch, supine thoracic extension mob    Consulted and Agree with Plan of Care Patient           Patient will benefit from skilled therapeutic intervention in order to improve the following deficits and impairments:  Pain,Increased fascial restricitons,Postural dysfunction,Decreased range of motion,Impaired flexibility  Visit Diagnosis: Pain in thoracic spine  Abnormal posture     Problem List Patient Active Problem List   Diagnosis Date Noted  . Left sided abdominal pain 02/19/2020  .  Diarrhea 02/19/2020  . Leukocytosis 02/19/2020  . LUQ pain 07/24/2018  . NASH (nonalcoholic steatohepatitis) 03/01/2017  . Liver cirrhosis secondary to NASH ([place] 08/03/2016  . Elevated ferritin 08/03/2016  . Hemochromatosis 01/05/2016  . Elevated liver enzymes 01/05/2016  . Arthralgia 01/05/2016  . Primary osteoarthritis of first carpometacarpal joint of left hand 10/22/2015  . Type 2 diabetes mellitus without complication, without long-term current use of insulin ([place] 10/16/2015  . Thumb tendonitis 10/06/2015  . Left wrist tendonitis 10/06/2015  . Humpback 10/06/2015  . Encounter for long-term opiate analgesic use 07/06/2015  . Arthritis of knee, left 01/20/2015  . Morbid obesity ([place] 12/30/2013  . GERD (gastroesophageal reflux disease) 09/27/2013  . Pain management 09/27/2013  . Anxiety and depression 09/27/2013  . Hyperlipemia 02/15/2013  . Urinary incontinence in female 02/15/2013  . RECTAL BLEEDING 07/19/2010   5:27 PM, 12/15/20 CJosue HectorPT DPT  [facility] [phone] [address]Phone: [phone]  Fax:  [phone] Name: Catherine WISDOMMRN: [id number] of Birth: 8December 27, 1957

## 2020-12-15 NOTE — Patient Instructions (Signed)
Access Code: [email or web address] URL: [email or web address] Date: 12/15/2020 Prepared by: Josue Hector  Exercises Thoracic Extension Mobilization on Foam Roll - 2-3 x daily - 7 x weekly - 2 sets - 10 reps - 3-5 seconds hold Supine Thoracic Mobilization Towel Roll Vertical with Arm Stretch - 2-3 x daily - 7 x weekly - 1 sets - 1 reps - 3-5 minute hold

## 2020-12-17 ENCOUNTER — Other Ambulatory Visit: Payer: Self-pay

## 2020-12-17 ENCOUNTER — Ambulatory Visit (HOSPITAL_COMMUNITY): Payer: 59 | Admitting: Physical Therapy

## 2020-12-17 ENCOUNTER — Encounter (HOSPITAL_COMMUNITY): Payer: Self-pay | Admitting: Physical Therapy

## 2020-12-17 DIAGNOSIS — R293 Abnormal posture: Secondary | ICD-10-CM

## 2020-12-17 DIAGNOSIS — M546 Pain in thoracic spine: Secondary | ICD-10-CM

## 2020-12-17 NOTE — Patient Instructions (Signed)
Access Code: [email or web address] URL: [email or web address] Date: 12/17/2020 Prepared by: Josue Hector  Exercises Sidelying Thoracic Rotation with Open Book - 2-3 x daily - 7 x weekly - 1 sets - 10 reps - 5 seconds hold Prone Cervical Retraction - 2-3 x daily - 7 x weekly - 1 sets - 10 reps - 5-10 seconds hold Prone Scapular Retraction - 2-3 x daily - 7 x weekly - 1 sets - 10 reps - 5-10 seconds hold Prone W Scapular Retraction - 2-3 x daily - 7 x weekly - 1 sets - 10 reps - 5-10 seconds hold Single Arm Doorway Pec Stretch at 90 Degrees Abduction - 2-3 x daily - 7 x weekly - 1 sets - 3 reps - 30 seconds hold

## 2020-12-17 NOTE — Therapy (Signed)
[place], [place], [address] Phone: [phone]   Fax:  [phone]  Physical Therapy Treatment  Patient Details  Name: Catherine Moore MRN: [id number] Date of Birth: [birth year] Referring Provider (PT): Wandra Feinstein MD   Encounter Date: 12/17/2020   PT End of Session - 12/17/20 1613    Visit Number 4    Number of Visits 8    Date for PT Re-Evaluation 12/24/20    Authorization Type [employer] (No auth, 30 VL)    Authorization - Visit Number 4    Authorization - Number of Visits 30    PT Start Time 1937    PT Stop Time 1651    PT Time Calculation (min) 41 min    Activity Tolerance Patient tolerated treatment well    Behavior During Therapy [facility] for tasks assessed/performed           Past Medical History:  Diagnosis Date  . Anxiety   . Arthritis   . Cirrhosis ([place])   . Complication of anesthesia   . Depression   . Diabetes ([place])   . GERD (gastroesophageal reflux disease)   . Hyperlipidemia   . PONV (postoperative nausea and vomiting)     Past Surgical History:  Procedure Laterality Date  . BIOPSY  08/16/2016   Procedure: BIOPSY;  Surgeon: Daneil Dolin, MD;  [facility];  Service: Endoscopy;;  . BIOPSY  08/16/2018   Procedure: BIOPSY;  Surgeon: Daneil Dolin, MD;  [facility];  Service: Endoscopy;;  gastric  . COLONOSCOPY  08/09/2010   TKW:IOXBDZ rectum colon  . COLONOSCOPY WITH PROPOFOL N/A 04/07/2020   Procedure: COLONOSCOPY WITH PROPOFOL;  Surgeon: Daneil Dolin, MD;  [facility];  Service: Endoscopy;  Laterality: N/A;  7:30am  . ENDOMETRIAL ABLATION    . ESOPHAGOGASTRODUODENOSCOPY N/A 08/16/2016   Dr. Gala Romney: normal esophagus, GAVE, multiple gastric polyps s/p biopsy (hyperplastic), normal duodenum   . ESOPHAGOGASTRODUODENOSCOPY (EGD) WITH PROPOFOL N/A 08/16/2018   Rourk: Multiple gastric polyps, pathology revealed fundic gland, gastric biopsies  negative for H. pylori.  . IR GENERIC HISTORICAL  06/13/2016   IR TRANSCATHETER BX 06/13/2016 [facility]  . IR GENERIC HISTORICAL  06/13/2016   IR US GUIDE VASC ACCESS RIGHT 06/13/2016 [facility]  . IR GENERIC HISTORICAL  06/13/2016   IR VENOGRAM HEPATIC W HEMODYNAMIC EVALUATION 06/13/2016 [facility]  . POLYPECTOMY  04/07/2020   Procedure: POLYPECTOMY;  Surgeon: Daneil Dolin, MD;  [facility];  Service: Endoscopy;;    There were no vitals filed for this visit.   Subjective Assessment - 12/17/20 1612    Subjective Patient says things are going fine. No pain in neck but having some pain in thoracic spine. This feels the same.    Limitations Lifting;Standing;House hold activities    How long can you stand comfortably? 2 hours    Diagnostic tests xrays    Patient Stated Goals not to be hump back    Currently in Pain? Yes    Pain Score 8     Pain Location Thoracic    Pain Orientation Mid;Posterior    Pain Descriptors / Indicators Aching;Constant    Pain Onset More than a month ago    Pain Frequency Intermittent                             [facility] Adult PT Treatment/Exercise -  12/17/20 0001      Neck Exercises: Sidelying   Other Sidelying Exercise open book stretch 10 x 5"      Neck Exercises: Prone   Neck Retraction 10 reps    Neck Retraction Limitations 10" holds    W Back 10 reps    W Back Weights (lbs) 10" holds    Other Prone Exercise scapular retraction 10 x 10"      Manual Therapy   Manual Therapy Joint mobilization    Manual therapy comments all manual interventions performed independently of other interventions    Joint Mobilization PA thoracic mobs grade II T6-T10, patient in prone      Neck Exercises: Stretches   Chest Stretch 3 reps;30 seconds                    PT Short Term Goals - 11/26/20 1652      PT SHORT TERM GOAL #1   Title Patient will be independent with initial HEP and self-management strategies to  improve functional outcomes    Time 2    Period Weeks    Status New    Target Date 12/10/20             PT Long Term Goals - 11/26/20 1653      PT LONG TERM GOAL #1   Title Patient will improve FOTO score by 10% to indicate improvement in functional outcomes    Time 4    Period Weeks    Status New    Target Date 12/24/20      PT LONG TERM GOAL #2   Title Patient will report at least 75% overall improvement in subjective complaint to indicate improvement in ability to perform ADLs.    Time 4    Period Weeks    Status New    Target Date 12/24/20      PT LONG TERM GOAL #3   Title Patient improve RT cervical rotation by at least 5 degrees in order to improve ability to scan environment for safety and while driving.    Time 4    Period Weeks    Status New    Target Date 12/24/20      PT LONG TERM GOAL #4   Title Patient will be able to stand at work up to 6-8 hours with mid back pain not to exceed 3/10 for improved ability to perform work tasks.    Time 4    Period Weeks    Status New    Target Date 12/24/20                 Plan - 12/17/20 1700    Clinical Impression Statement Patient continues to be limited by poor posturing and decreased thoracic spine mobility. Patient sensitive to grade II thoracic mobs near T8. Educated patient on self-thoracic rotation mobilization. Noted restriction with RT rotation. Added open book stretch to HEP. Also added pec stretching in doorway and discussed improving postural awareness and standing habits with ergonomics in workplace. Issued handout. Patient reports pain reduction from 8/10 to 3/10 post session. Patient will continue to benefit from skilled therapy services to progress postural strength and mobility to reduce pain and improve functional level.    Examination-Activity Limitations Carry;Lift;Stand    Examination-Participation Restrictions Yard Work;Cleaning;Community Activity;Occupation;Laundry    Stability/Clinical  Decision Making Stable/Uncomplicated    Rehab Potential Good    PT Frequency 2x / week    PT Duration 4 weeks  PT Treatment/Interventions ADLs/Self Care Home Management;Vestibular;Scar mobilization;Balance training;DME Instruction;Iontophoresis 41m/ml Dexamethasone;Neuromuscular re-education;Gait training;Passive range of motion;Manual techniques;Functional mobility training;Stair training;Dry needling;Moist Heat;Aquatic Therapy;Traction;Biofeedback;Ultrasound;Canalith Repostioning;Parrafin;Therapeutic activities;Fluidtherapy;Cryotherapy;Electrical Stimulation;Contrast Bath;Therapeutic exercise;Patient/family education;Orthotic Fit/Training;Manual lymph drainage;Compression bandaging;Energy conservation;Splinting;Spinal Manipulations;Joint Manipulations;Taping;Vasopneumatic Device;Visual/perceptual remediation/compensation    PT Next Visit Plan dry needling, perform if indicated. Manuals as needed to address pain and restriciton. Progress postural strength. Add band rows and extension    PT Home Exercise Plan Eval: posture corrections, chin tuck, scpaular retraction, upper trap stretching 3/1 supine pec stretch, supine thoracic extension mob 3/3 prone scap squeeze, chin tuck, W backs, open book, pec stretch in doorway    Consulted and Agree with Plan of Care Patient           Patient will benefit from skilled therapeutic intervention in order to improve the following deficits and impairments:  Pain,Increased fascial restricitons,Postural dysfunction,Decreased range of motion,Impaired flexibility  Visit Diagnosis: Pain in thoracic spine  Abnormal posture     Problem List Patient Active Problem List   Diagnosis Date Noted  . Left sided abdominal pain 02/19/2020  . Diarrhea 02/19/2020  . Leukocytosis 02/19/2020  . LUQ pain 07/24/2018  . NASH (nonalcoholic steatohepatitis) 03/01/2017  . Liver cirrhosis secondary to NASH ([place] 08/03/2016  . Elevated ferritin 08/03/2016  .  Hemochromatosis 01/05/2016  . Elevated liver enzymes 01/05/2016  . Arthralgia 01/05/2016  . Primary osteoarthritis of first carpometacarpal joint of left hand 10/22/2015  . Type 2 diabetes mellitus without complication, without long-term current use of insulin ([place] 10/16/2015  . Thumb tendonitis 10/06/2015  . Left wrist tendonitis 10/06/2015  . Humpback 10/06/2015  . Encounter for long-term opiate analgesic use 07/06/2015  . Arthritis of knee, left 01/20/2015  . Morbid obesity ([place] 12/30/2013  . GERD (gastroesophageal reflux disease) 09/27/2013  . Pain management 09/27/2013  . Anxiety and depression 09/27/2013  . Hyperlipemia 02/15/2013  . Urinary incontinence in female 02/15/2013  . RECTAL BLEEDING 07/19/2010   5:04 PM, 12/17/20 CJosue HectorPT DPT  [facility] [phone] [address]Phone: [phone]  Fax:  [phone] Name: Catherine WHITTINGHILLMRN: [id number] of Birth: 803-31-1957

## 2020-12-22 ENCOUNTER — Ambulatory Visit (HOSPITAL_COMMUNITY): Payer: 59 | Admitting: Physical Therapy

## 2020-12-24 ENCOUNTER — Ambulatory Visit (HOSPITAL_COMMUNITY): Payer: 59 | Admitting: Physical Therapy

## 2021-01-01 ENCOUNTER — Telehealth: Payer: Self-pay | Admitting: Nurse Practitioner

## 2021-01-01 ENCOUNTER — Other Ambulatory Visit: Payer: Self-pay | Admitting: Nurse Practitioner

## 2021-01-01 MED ORDER — CIPROFLOXACIN HCL 500 MG PO TABS: 500.0000 mg | ORAL_TABLET | Freq: Two times a day (BID) | ORAL | 0 refills | Status: DC

## 2021-01-01 NOTE — Telephone Encounter (Signed)
Left message to return call; also sent Mychart message

## 2021-01-01 NOTE — Telephone Encounter (Signed)
Pt is having frequency and burning. Going on for 2 weeks. Has been taken AYR and drinking water. Pt requesting this message be sent to [facility]. Please advise. Thank you

## 2021-01-01 NOTE — Telephone Encounter (Signed)
Catherine Moore, thanks for the information but can you check to see if she has fever or new mid back pain? Any recent UTIs or antibiotics? Thanks!

## 2021-01-01 NOTE — Telephone Encounter (Signed)
Spoke with patient. Worsening symptoms over the past few days. Urgency, Frequency and Dysuria. Minimal relief at this point with Pyridium. Will send in Cipro. Reviewed warning signs. Call back next week for OV if no better, go to ED or Urgent care this weekend if worse.

## 2021-01-01 NOTE — Telephone Encounter (Signed)
Left message to return call 

## 2021-01-01 NOTE — Telephone Encounter (Signed)
Pt returned call and has been having low back pain and side pain-going on for about 2 weeks. No fever. Pt states she knows its a kidney infection. Hurting in lower back. No recent UTI or antibiotics. Please advise. Thank you

## 2021-01-01 NOTE — Telephone Encounter (Signed)
Patient is wanting something called in for a bad UTI . I tried to explain to patient must have office visit she stated at work and couldn't come in. Please advise [facility]

## 2021-01-01 NOTE — Telephone Encounter (Signed)
Patient is returning to the nurse

## 2021-01-07 ENCOUNTER — Encounter: Payer: Self-pay | Admitting: Family Medicine

## 2021-01-07 ENCOUNTER — Other Ambulatory Visit: Payer: Self-pay

## 2021-01-07 ENCOUNTER — Ambulatory Visit (INDEPENDENT_AMBULATORY_CARE_PROVIDER_SITE_OTHER): Payer: 59 | Admitting: Family Medicine

## 2021-01-07 VITALS — BP 128/74 | HR 90 | Temp 97.2°F | Ht 66.0 in | Wt 187.0 lb

## 2021-01-07 DIAGNOSIS — N3000 Acute cystitis without hematuria: Secondary | ICD-10-CM

## 2021-01-07 DIAGNOSIS — R300 Dysuria: Secondary | ICD-10-CM

## 2021-01-07 LAB — POCT URINALYSIS DIPSTICK
Spec Grav, UA: >=1.03 — AB (ref 1.010–1.025)
Urobilinogen, UA: 2 E.U./dL — AB
pH, UA: 5 (ref 5.0–8.0)

## 2021-01-07 MED ORDER — NITROFURANTOIN MONOHYD MACRO 100 MG PO CAPS: 100.0000 mg | ORAL_CAPSULE | Freq: Two times a day (BID) | ORAL | 0 refills | Status: DC

## 2021-01-07 NOTE — Progress Notes (Signed)
Patient ID: Catherine Moore, female    DOB: 1956/08/03, 65 y.o.   MRN: [id number]   No chief complaint on file.  Subjective:  CC: urinary frequency, burning, urgency  This is not a new problem.  Resents today with a complaint of painful urination, urgency and frequency.  Symptoms have been present for 3 weeks.  On March 18 received Cipro antibiotic for 3 days.  Symptoms have not improved.  Has tried Azo and Cipro.  Endorses left flank pain, no fever, no chills, no chest pain, no shortness of breath.   pain with urination. Started 3 weeks ago. Tried azo and cipro.    Medical History [name] has a past medical history of Anxiety, Arthritis, Cirrhosis ([place]), Complication of anesthesia, Depression, Diabetes ([place]), GERD (gastroesophageal reflux disease), Hyperlipidemia, and PONV (postoperative nausea and vomiting).   Outpatient Encounter Medications as of 01/07/2021  Medication Sig  . ALPRAZolam (XANAX) 1 MG tablet Take 1 mg by mouth daily as needed.  . blood glucose meter kit and supplies Dispense based on patient and insurance preference. Use up to four times daily as directed. (FOR ICD-10 E10.9, E11.9).  . cetirizine (ZYRTEC) 10 MG tablet TAKE 1 TABLET BY MOUTH EVERY DAY  . dicyclomine (BENTYL) 10 MG capsule Take 10 mg by mouth 4 (four) times daily as needed for spasms.   . DULoxetine (CYMBALTA) 60 MG capsule TAKE 1 CAPSULE BY MOUTH EVERY DAY  . fluticasone (FLONASE) 50 MCG/ACT nasal spray INSTILL 2 SPRAYS IN EACH NOSTRIL EVERY DAY  . gabapentin (NEURONTIN) 300 MG capsule gabapentin 300 mg capsule  TAKE TWO CAPSULES BY MOUTH EVERY DAY  . glipiZIDE (GLUCOTROL XL) 5 MG 24 hr tablet TAKE 1 TABLET BY MOUTH EVERY DAY WITH BREAKFAST  . HYDROcodone-acetaminophen (NORCO) 10-325 MG tablet hydrocodone 10 mg-acetaminophen 325 mg tablet  TAKE 1 TABLET BY MOUTH THREE TIMES DAILY AS NEEDED  . Lactobacillus Rhamnosus, GG, (CULTURELLE PO) Take 1 capsule by mouth daily.   . metFORMIN (GLUCOPHAGE) 500 MG  tablet TAKE 1 TABLET BY MOUTH TWICE DAILY WITH A MEAL  . nitrofurantoin, macrocrystal-monohydrate, (MACROBID) 100 MG capsule Take 1 capsule (100 mg total) by mouth 2 (two) times daily.  Marland Kitchen omeprazole (PRILOSEC) 20 MG capsule TAKE ONE CAPSULE BY MOUTH TWICE DAILY BEFORE A MEAL  . rosuvastatin (CRESTOR) 10 MG tablet TAKE 1 TABLET BY MOUTH EVERY DAY  . vitamin B-12 (CYANOCOBALAMIN) 1000 MCG tablet Take 1,000 mcg by mouth daily.  . [DISCONTINUED] ciprofloxacin (CIPRO) 500 MG tablet Take 1 tablet (500 mg total) by mouth 2 (two) times daily. X 3 d   No facility-administered encounter medications on file as of 01/07/2021.     Review of Systems  Constitutional: Negative for chills and fever.  Respiratory: Negative for shortness of breath.   Cardiovascular: Negative for chest pain.  Gastrointestinal: Negative for abdominal pain.  Genitourinary: Positive for decreased urine volume, dysuria, frequency and urgency. Negative for hematuria.  Musculoskeletal: Positive for back pain.     Vitals BP 128/74   Pulse 90   Temp (!) 97.2 F (36.2 C)   Ht 5' 6"  (1.676 m)   Wt 187 lb (84.8 kg)   SpO2 95%   BMI 30.18 kg/m   Objective:   Physical Exam Vitals reviewed.  Constitutional:      Appearance: Normal appearance.  Cardiovascular:     Rate and Rhythm: Normal rate and regular rhythm.     Heart sounds: Normal heart sounds.  Pulmonary:     Effort: Pulmonary  effort is normal.     Breath sounds: Normal breath sounds.  Abdominal:     General: Bowel sounds are normal.     Tenderness: There is left CVA tenderness. There is no right CVA tenderness.  Skin:    General: Skin is warm and dry.  Neurological:     General: No focal deficit present.     Mental Status: She is alert.  Psychiatric:        Behavior: Behavior normal.     Results for orders placed or performed in visit on 01/07/21  POCT urinalysis dipstick  Result Value Ref Range   Color, UA     Clarity, UA     Glucose, UA      Bilirubin, UA +    Ketones, UA     Spec Grav, UA >=1.030 (A) 1.010 - 1.025   Blood, UA     pH, UA 5.0 5.0 - 8.0   Protein, UA     Urobilinogen, UA 2.0 (A) 0.2 or 1.0 E.U./dL   Nitrite, UA     Leukocytes, UA Trace (A) Negative   Appearance     Odor      Assessment and Plan   1. Dysuria - POCT urinalysis dipstick  2. Acute cystitis without hematuria - nitrofurantoin, macrocrystal-monohydrate, (MACROBID) 100 MG capsule; Take 1 capsule (100 mg total) by mouth 2 (two) times daily.  Dispense: 20 capsule; Refill: 0   Cipro did not resolve  symptoms.  Urine dip today with trace leukocytes, will treat with Macrobid for 10 days.  She understands warning signs such as fever, chills, rigors, increasing back pain.  She is instructed to seek emergency room care at this develops.  Agrees with plan of care discussed today. Understands warning signs to seek further care: chest pain, shortness of breath, any significant change in health.  Understands to follow-up if symptoms do not improve, or worsen.  Warning signs discussed above.  Will send urine for culture.    Pecolia Ades, NP 01/07/2021

## 2021-01-07 NOTE — Patient Instructions (Signed)
Urinary Tract Infection, Adult A urinary tract infection (UTI) is an infection of any part of the urinary tract. The urinary tract includes:  The kidneys.  The ureters.  The bladder.  The urethra. These organs make, store, and get rid of pee (urine) in the body. What are the causes? This infection is caused by germs (bacteria) in your genital area. These germs grow and cause swelling (inflammation) of your urinary tract. What increases the risk? The following factors may make you more likely to develop this condition:  Using a small, thin tube (catheter) to drain pee.  Not being able to control when you pee or poop (incontinence).  Being female. If you are female, these things can increase the risk: ? Using these methods to prevent pregnancy:  A medicine that kills sperm (spermicide).  A device that blocks sperm (diaphragm). ? Having low levels of a female hormone (estrogen). ? Being pregnant. You are more likely to develop this condition if:  You have genes that add to your risk.  You are sexually active.  You take antibiotic medicines.  You have trouble peeing because of: ? A prostate that is bigger than normal, if you are female. ? A blockage in the part of your body that drains pee from the bladder. ? A kidney stone. ? A nerve condition that affects your bladder. ? Not getting enough to drink. ? Not peeing often enough.  You have other conditions, such as: ? Diabetes. ? A weak disease-fighting system (immune system). ? Sickle cell disease. ? Gout. ? Injury of the spine. What are the signs or symptoms? Symptoms of this condition include:  Needing to pee right away.  Peeing small amounts often.  Pain or burning when peeing.  Blood in the pee.  Pee that smells bad or not like normal.  Trouble peeing.  Pee that is cloudy.  Fluid coming from the vagina, if you are female.  Pain in the belly or lower back. Other symptoms include:  Vomiting.  Not  feeling hungry.  Feeling mixed up (confused). This may be the first symptom in older adults.  Being tired and grouchy (irritable).  A fever.  Watery poop (diarrhea). How is this treated?  Taking antibiotic medicine.  Taking other medicines.  Drinking enough water. In some cases, you may need to see a specialist. Follow these instructions at home: Medicines  Take over-the-counter and prescription medicines only as told by your doctor.  If you were prescribed an antibiotic medicine, take it as told by your doctor. Do not stop taking it even if you start to feel better. General instructions  Make sure you: ? Pee until your bladder is empty. ? Do not hold pee for a long time. ? Empty your bladder after sex. ? Wipe from front to back after peeing or pooping if you are a female. Use each tissue one time when you wipe.  Drink enough fluid to keep your pee pale yellow.  Keep all follow-up visits.   Contact a doctor if:  You do not get better after 1-2 days.  Your symptoms go away and then come back. Get help right away if:  You have very bad back pain.  You have very bad pain in your lower belly.  You have a fever.  You have chills.  You feeling like you will vomit or you vomit. Summary  A urinary tract infection (UTI) is an infection of any part of the urinary tract.  This condition is caused by   germs in your genital area.  There are many risk factors for a UTI.  Treatment includes antibiotic medicines.  Drink enough fluid to keep your pee pale yellow. This information is not intended to replace advice given to you by your health care provider. Make sure you discuss any questions you have with your health care provider. Document Revised: 05/15/2020 Document Reviewed: 05/15/2020 Elsevier Patient Education  2021 Elsevier Inc.  

## 2021-01-10 LAB — URINE CULTURE

## 2021-01-10 LAB — SPECIMEN STATUS REPORT

## 2021-01-11 ENCOUNTER — Telehealth: Payer: Self-pay

## 2021-01-11 NOTE — Telephone Encounter (Signed)
Catherine Moore returning Catherine Moore phone call   Pt call back [phone]

## 2021-01-11 NOTE — Telephone Encounter (Signed)
See result note.  

## 2021-01-19 ENCOUNTER — Other Ambulatory Visit: Payer: Self-pay | Admitting: Family Medicine

## 2021-01-22 ENCOUNTER — Encounter: Payer: Self-pay | Admitting: Nurse Practitioner

## 2021-01-22 ENCOUNTER — Telehealth: Payer: Self-pay

## 2021-01-22 NOTE — Telephone Encounter (Signed)
Message sent to patient through my chart.

## 2021-01-22 NOTE — Telephone Encounter (Signed)
Catherine Moore called wanted to let Catherine Moore know she has been treated twice this month UTI and took meds for it and she still burning. Also she now has congestion and stuffy nose.  Pt call back [phone]

## 2021-01-23 ENCOUNTER — Other Ambulatory Visit: Payer: Self-pay | Admitting: Nurse Practitioner

## 2021-01-23 MED ORDER — SULFAMETHOXAZOLE-TRIMETHOPRIM 800-160 MG PO TABS: 1.0000 | ORAL_TABLET | Freq: Two times a day (BID) | ORAL | 0 refills | Status: DC

## 2021-01-23 NOTE — Telephone Encounter (Signed)
See note in chart for 01/23/21.

## 2021-01-23 NOTE — Progress Notes (Signed)
See my chart message dated 01/22/21. Spoke with patient over the phone. Started her Flonase and neti pot for her congestion which is improving today. Less greenish color in her mucus. No relief of her urinary symptoms with Macrobid. See Urine culture. Has also completed a 3 d course of Cipro. She is requesting Bactrim DS. 7 d course sent in. Patient agrees to schedule office visit if symptoms persist after this. Has been taking AZO daily. Take for 48 hours then DC after starting Bactrim. Seek help if new or worsening symptoms develop.

## 2021-02-08 ENCOUNTER — Other Ambulatory Visit: Payer: Self-pay | Admitting: Family Medicine

## 2021-02-08 NOTE — Telephone Encounter (Signed)
Needs visit and then route back for refill

## 2021-02-18 ENCOUNTER — Other Ambulatory Visit: Payer: Self-pay

## 2021-02-18 NOTE — Telephone Encounter (Signed)
Schedule appointment with [name] on 6/10

## 2021-02-18 NOTE — Telephone Encounter (Signed)
Patient is requesting refill on metformin 500 mg called in [facility]. She has medication follow up on 6/10 but will be out of medication tomorrow.

## 2021-02-18 NOTE — Telephone Encounter (Signed)
Sent my chart message to schedule appointment.

## 2021-02-19 NOTE — Telephone Encounter (Signed)
Nurse please close patient has follow up on 6/10

## 2021-03-09 ENCOUNTER — Other Ambulatory Visit: Payer: Self-pay | Admitting: Nurse Practitioner

## 2021-03-11 ENCOUNTER — Other Ambulatory Visit: Payer: Self-pay | Admitting: Family Medicine

## 2021-03-26 ENCOUNTER — Ambulatory Visit (INDEPENDENT_AMBULATORY_CARE_PROVIDER_SITE_OTHER): Payer: 59 | Admitting: Nurse Practitioner

## 2021-03-26 ENCOUNTER — Other Ambulatory Visit: Payer: Self-pay

## 2021-03-26 ENCOUNTER — Encounter: Payer: Self-pay | Admitting: Nurse Practitioner

## 2021-03-26 VITALS — BP 138/75 | HR 90 | Temp 97.9°F | Ht 66.0 in | Wt 186.0 lb

## 2021-03-26 DIAGNOSIS — F32A Depression, unspecified: Secondary | ICD-10-CM | POA: Diagnosis not present

## 2021-03-26 DIAGNOSIS — E7800 Pure hypercholesterolemia, unspecified: Secondary | ICD-10-CM | POA: Diagnosis not present

## 2021-03-26 DIAGNOSIS — F419 Anxiety disorder, unspecified: Secondary | ICD-10-CM

## 2021-03-26 DIAGNOSIS — E119 Type 2 diabetes mellitus without complications: Secondary | ICD-10-CM

## 2021-03-26 NOTE — Progress Notes (Signed)
   Subjective:    Patient ID: Catherine Moore, female    DOB: Dec 19, 1955, 65 y.o.   MRN: [id number]  Diabetes She has type 2 diabetes mellitus. Current diabetic treatments: glipizide, metformin.  Adherent to medication regimen.  Continues follow-up with pain management specialist.  Has restarted working part-time about 6 hours/day which has helped her mentally since losing her mother.  No chest pain/ischemic type pain or unusual shortness of breath or edema.  No numbness or pain in the feet.  Depression screen [facility] 2/9 03/26/2021 11/01/2019 12/31/2018 10/03/2018 04/24/2018  Decreased Interest 0 0 2 3 0  Down, Depressed, Hopeless 0 0 1 0 0  PHQ - 2 Score 0 0 3 3 0  Altered sleeping - - 2 0 -  Tired, decreased energy - - 3 1 -  Change in appetite - - 1 0 -  Feeling bad or failure about yourself  - - 0 0 -  Trouble concentrating - - 0 0 -  Moving slowly or fidgety/restless - - 0 0 -  Suicidal thoughts - - 0 0 -  PHQ-9 Score - - 9 4 -  Difficult doing work/chores - - Not difficult at all Not difficult at all -        Objective:   Physical Exam NAD.  Alert, oriented.  Lungs clear.  Heart regular rate and rhythm.  Lower extremities no edema. Diabetic Foot Exam - Simple   Simple Foot Form  03/26/2021  3:40 PM  Visual Inspection No deformities, no ulcerations, no other skin breakdown bilaterally: Yes Sensation Testing Intact to touch and monofilament testing bilaterally: Yes Pulse Check Posterior Tibialis and Dorsalis pulse intact bilaterally: Yes Comments    Today's Vitals   03/26/21 1539  BP: 138/75  Pulse: 90  Temp: 97.9 F (36.6 C)  SpO2: 96%  Weight: 186 lb (84.4 kg)  Height: 5' 6"  (1.676 m)   Body mass index is 30.02 kg/m.       Assessment & Plan:   Problem List Items Addressed This Visit       Endocrine   Type 2 diabetes mellitus without complication, without long-term current use of insulin ([place]) - Primary     Other   Anxiety and depression   Hyperlipemia    Patient wishes to hold off on her physical and labs until she turns 28 in August and starts Medicare. Patient to call before that visit so lab work can be ordered.  Plan physical and welcome to Medicare visit at that time.

## 2021-04-09 ENCOUNTER — Other Ambulatory Visit: Payer: Self-pay | Admitting: Family Medicine

## 2021-04-09 ENCOUNTER — Other Ambulatory Visit: Payer: Self-pay | Admitting: Nurse Practitioner

## 2021-04-09 ENCOUNTER — Ambulatory Visit: Payer: 59 | Admitting: Nurse Practitioner

## 2021-04-16 ENCOUNTER — Other Ambulatory Visit: Payer: Self-pay | Admitting: Family Medicine

## 2021-05-09 ENCOUNTER — Other Ambulatory Visit: Payer: Self-pay | Admitting: Family Medicine

## 2021-05-09 DIAGNOSIS — E119 Type 2 diabetes mellitus without complications: Secondary | ICD-10-CM

## 2021-05-09 DIAGNOSIS — F32A Depression, unspecified: Secondary | ICD-10-CM

## 2021-05-31 ENCOUNTER — Telehealth: Payer: Self-pay | Admitting: Nurse Practitioner

## 2021-05-31 DIAGNOSIS — E119 Type 2 diabetes mellitus without complications: Secondary | ICD-10-CM

## 2021-05-31 DIAGNOSIS — E7800 Pure hypercholesterolemia, unspecified: Secondary | ICD-10-CM

## 2021-05-31 NOTE — Telephone Encounter (Signed)
Last labs 11/2020: CBC, HgbA1c, Lipid, CMP, Microalbumin urine

## 2021-05-31 NOTE — Telephone Encounter (Signed)
Patient has appointment on 8/19 and needing labs done

## 2021-06-03 NOTE — Telephone Encounter (Signed)
Blood work ordered in [facility]. Left message to return call

## 2021-06-03 NOTE — Telephone Encounter (Signed)
Patient notified and stated she will get labs drawn after her appt with [name] NP

## 2021-06-03 NOTE — Telephone Encounter (Signed)
Pearson Forster C, NP   Lipid, A1C and CMP. Thanks.

## 2021-06-04 ENCOUNTER — Other Ambulatory Visit: Payer: Self-pay

## 2021-06-04 ENCOUNTER — Ambulatory Visit (INDEPENDENT_AMBULATORY_CARE_PROVIDER_SITE_OTHER): Payer: Medicare Other | Admitting: Nurse Practitioner

## 2021-06-04 VITALS — BP 125/60 | HR 73 | Temp 97.3°F | Ht 66.0 in | Wt 188.0 lb

## 2021-06-04 DIAGNOSIS — E119 Type 2 diabetes mellitus without complications: Secondary | ICD-10-CM | POA: Diagnosis not present

## 2021-06-04 DIAGNOSIS — E7800 Pure hypercholesterolemia, unspecified: Secondary | ICD-10-CM | POA: Diagnosis not present

## 2021-06-04 DIAGNOSIS — F32A Depression, unspecified: Secondary | ICD-10-CM | POA: Diagnosis not present

## 2021-06-04 DIAGNOSIS — Z0000 Encounter for general adult medical examination without abnormal findings: Secondary | ICD-10-CM

## 2021-06-04 DIAGNOSIS — Z01419 Encounter for gynecological examination (general) (routine) without abnormal findings: Secondary | ICD-10-CM

## 2021-06-04 DIAGNOSIS — Z0001 Encounter for general adult medical examination with abnormal findings: Secondary | ICD-10-CM

## 2021-06-04 DIAGNOSIS — F419 Anxiety disorder, unspecified: Secondary | ICD-10-CM | POA: Diagnosis not present

## 2021-06-04 NOTE — Progress Notes (Signed)
Subjective:    Patient ID: Catherine Moore, female    DOB: 02-16-1956, 65 y.o.   MRN: [id number]  HPI Patient presents for physical. Denies any falls or accidents within the last 6 months. Says she has an eye exam scheduled for tomorrow and will schedule dental appt soon. Had colonoscopy 1 yr ago. Patient will make mammogram appt. Workouts at [employer] 3X a week. She does not drink or smoke. Not sexually active. Feels health is good and has no concerns or questions. Eats well. Regular follow up with GI for cirrhosis.  Labs drawn this am.   Review of Systems  Constitutional:  Negative for activity change, appetite change, fatigue and fever.  HENT:  Negative for sore throat and trouble swallowing.   Eyes:  Negative for visual disturbance.  Respiratory:  Negative for cough, chest tightness, shortness of breath and wheezing.   Cardiovascular:  Negative for chest pain, palpitations and leg swelling.  Gastrointestinal:  Negative for abdominal distention, abdominal pain, blood in stool, constipation, diarrhea, nausea and vomiting.  Genitourinary:  Positive for urgency. Negative for difficulty urinating, dysuria, enuresis, flank pain, frequency, genital sores, pelvic pain, vaginal bleeding, vaginal discharge and vaginal pain.  Neurological:  Negative for dizziness, tremors, weakness, light-headedness, numbness and headaches.  Psychiatric/Behavioral:  Negative for agitation, behavioral problems and confusion.   Depression screen PHQ 2/9 03/26/2021  Decreased Interest 0  Down, Depressed, Hopeless 0  PHQ - 2 Score 0  Altered sleeping -  Tired, decreased energy -  Change in appetite -  Feeling bad or failure about yourself  -  Trouble concentrating -  Moving slowly or fidgety/restless -  Suicidal thoughts -  PHQ-9 Score -  Difficult doing work/chores -       Objective:   Physical Exam Vitals and nursing note reviewed. Exam conducted with a chaperone present.  Constitutional:       General: She is not in acute distress.    Appearance: Normal appearance.  Neck:     Comments: Thyroid non tender, no masses or goiter noted Cardiovascular:     Rate and Rhythm: Normal rate and regular rhythm.     Pulses:          Posterior tibial pulses are detected w/ Doppler on the left side.     Heart sounds: Normal heart sounds. No murmur heard. Pulmonary:     Effort: Pulmonary effort is normal.     Breath sounds: Normal breath sounds.  Chest:  Breasts:    Right: No swelling, inverted nipple, mass, skin change or tenderness.     Left: No swelling, inverted nipple, mass, skin change or tenderness.  Abdominal:     General: There is no distension.     Palpations: Abdomen is soft.     Tenderness: There is no abdominal tenderness.  Genitourinary:    Comments: Defers GU exam. Denies any problems.  Musculoskeletal:     Cervical back: Normal range of motion and neck supple.  Feet:     Right foot:     Skin integrity: Skin integrity normal.     Toenail Condition: Right toenails are normal.     Left foot:     Skin integrity: Skin integrity normal.     Toenail Condition: Left toenails are normal.  Lymphadenopathy:     Cervical: No cervical adenopathy.     Upper Body:     Right upper body: No supraclavicular, axillary or pectoral adenopathy.     Left upper body: No supraclavicular,  axillary or pectoral adenopathy.  Skin:    General: Skin is warm and dry.  Neurological:     Mental Status: She is alert and oriented to person, place, and time.     Gait: Gait is intact.  Psychiatric:        Mood and Affect: Mood normal.        Behavior: Behavior normal.        Thought Content: Thought content normal.        Judgment: Judgment normal.   Diabetic Foot Exam - Simple   Simple Foot Form Diabetic Foot exam was performed with the following findings: Yes 06/04/2021  4:29 PM  Visual Inspection No deformities, no ulcerations, no other skin breakdown bilaterally: Yes Sensation  Testing Intact to touch and monofilament testing bilaterally: Yes Pulse Check Posterior Tibialis and Dorsalis pulse intact bilaterally: Yes Comments     Today's Vitals   06/04/21 1533  BP: 125/60  Pulse: 73  Temp: (!) 97.3 F (36.3 C)  SpO2: 97%  Weight: 188 lb (85.3 kg)  Height: 5' 6"  (1.676 m)   Body mass index is 30.34 kg/m.        Assessment & Plan:   Problem List Items Addressed This Visit       Endocrine   Type 2 diabetes mellitus without complication, without long-term current use of insulin ([place])     Other   Anxiety and depression   Hyperlipemia   Other Visit Diagnoses     Welcome to Medicare preventive visit    -  Primary   Well woman exam          Labs drawn this am. Results pending. Continue healthy lifestyle. Continue current medications.  Consider Shingrix, Tdap and Prevnar 20 at local pharmacy.  Plans to schedule her own mammogram.  Return in about 3 months (around 09/04/2021).

## 2021-06-04 NOTE — Progress Notes (Signed)
   Subjective:    Patient ID: Catherine Moore, female    DOB: 29-Oct-1955, 65 y.o.   MRN: [id number]  HPI  AWV- Annual Wellness Visit  The patient was seen for their annual wellness visit. The patient's past medical history, surgical history, and family history were reviewed. Pertinent vaccines were reviewed ( tetanus, pneumonia, shingles, flu) The patient's medication list was reviewed and updated.  The height and weight were entered.  BMI recorded in electronic record elsewhere  Cognitive screening was completed. Outcome of Mini - Cog: 5   Falls /depression screening electronically recorded within record elsewhere  Current tobacco usage:no (All patients who use tobacco were given written and verbal information on quitting)  Recent listing of emergency department/hospitalizations over the past year were reviewed.  current specialist the patient sees on a regular basis: none   Medicare annual wellness visit patient questionnaire was reviewed.  A written screening schedule for the patient for the next 5-10 years was given. Appropriate discussion of followup regarding next visit was discussed.     Review of Systems     Objective:   Physical Exam        Assessment & Plan:

## 2021-06-05 LAB — COMPREHENSIVE METABOLIC PANEL
ALT: 13 IU/L (ref 0–32)
AST: 19 IU/L (ref 0–40)
Albumin/Globulin Ratio: 2.6 — ABNORMAL HIGH (ref 1.2–2.2)
Albumin: 4.9 g/dL — ABNORMAL HIGH (ref 3.8–4.8)
Alkaline Phosphatase: 58 IU/L (ref 44–121)
BUN/Creatinine Ratio: 19 (ref 12–28)
BUN: 13 mg/dL (ref 8–27)
Bilirubin Total: 0.4 mg/dL (ref 0.0–1.2)
CO2: 24 mmol/L (ref 20–29)
Calcium: 9.2 mg/dL (ref 8.7–10.3)
Chloride: 104 mmol/L (ref 96–106)
Creatinine, Ser: 0.69 mg/dL (ref 0.57–1.00)
Globulin, Total: 1.9 g/dL (ref 1.5–4.5)
Glucose: 81 mg/dL (ref 65–99)
Potassium: 4.5 mmol/L (ref 3.5–5.2)
Sodium: 145 mmol/L — ABNORMAL HIGH (ref 134–144)
Total Protein: 6.8 g/dL (ref 6.0–8.5)
eGFR: 96 mL/min/{1.73_m2} (ref >59)

## 2021-06-05 LAB — HEMOGLOBIN A1C
Est. average glucose Bld gHb Est-mCnc: 131 mg/dL
Hgb A1c MFr Bld: 6.2 % — ABNORMAL HIGH (ref 4.8–5.6)

## 2021-06-05 LAB — LIPID PANEL
Chol/HDL Ratio: 3 ratio (ref 0.0–4.4)
Cholesterol, Total: 155 mg/dL (ref 100–199)
HDL: 52 mg/dL (ref >39)
LDL Chol Calc (NIH): 84 mg/dL (ref 0–99)
Triglycerides: 102 mg/dL (ref 0–149)
VLDL Cholesterol Cal: 19 mg/dL (ref 5–40)

## 2021-06-06 ENCOUNTER — Encounter: Payer: Self-pay | Admitting: Nurse Practitioner

## 2021-06-07 ENCOUNTER — Other Ambulatory Visit: Payer: Self-pay | Admitting: Family Medicine

## 2021-06-09 DIAGNOSIS — M130 Polyarthritis, unspecified: Secondary | ICD-10-CM | POA: Diagnosis not present

## 2021-06-09 DIAGNOSIS — Z79891 Long term (current) use of opiate analgesic: Secondary | ICD-10-CM | POA: Diagnosis not present

## 2021-06-09 DIAGNOSIS — M542 Cervicalgia: Secondary | ICD-10-CM | POA: Diagnosis not present

## 2021-06-09 DIAGNOSIS — M545 Low back pain, unspecified: Secondary | ICD-10-CM | POA: Diagnosis not present

## 2021-06-09 DIAGNOSIS — G4700 Insomnia, unspecified: Secondary | ICD-10-CM | POA: Diagnosis not present

## 2021-07-16 ENCOUNTER — Other Ambulatory Visit: Payer: Self-pay | Admitting: Family Medicine

## 2021-09-01 DIAGNOSIS — M130 Polyarthritis, unspecified: Secondary | ICD-10-CM | POA: Diagnosis not present

## 2021-09-01 DIAGNOSIS — Z79891 Long term (current) use of opiate analgesic: Secondary | ICD-10-CM | POA: Diagnosis not present

## 2021-09-01 DIAGNOSIS — M542 Cervicalgia: Secondary | ICD-10-CM | POA: Diagnosis not present

## 2021-09-01 DIAGNOSIS — M545 Low back pain, unspecified: Secondary | ICD-10-CM | POA: Diagnosis not present

## 2021-09-01 DIAGNOSIS — G4700 Insomnia, unspecified: Secondary | ICD-10-CM | POA: Diagnosis not present

## 2021-09-06 ENCOUNTER — Other Ambulatory Visit: Payer: Self-pay | Admitting: Family Medicine

## 2021-09-06 DIAGNOSIS — E119 Type 2 diabetes mellitus without complications: Secondary | ICD-10-CM

## 2021-09-06 DIAGNOSIS — F419 Anxiety disorder, unspecified: Secondary | ICD-10-CM

## 2021-09-06 DIAGNOSIS — R350 Frequency of micturition: Secondary | ICD-10-CM | POA: Diagnosis not present

## 2021-09-14 ENCOUNTER — Ambulatory Visit (INDEPENDENT_AMBULATORY_CARE_PROVIDER_SITE_OTHER): Payer: Medicare Other | Admitting: Nurse Practitioner

## 2021-09-14 ENCOUNTER — Other Ambulatory Visit: Payer: Self-pay

## 2021-09-14 ENCOUNTER — Encounter: Payer: Self-pay | Admitting: Nurse Practitioner

## 2021-09-14 VITALS — BP 131/70 | HR 89 | Temp 97.1°F | Wt 191.4 lb

## 2021-09-14 DIAGNOSIS — R300 Dysuria: Secondary | ICD-10-CM | POA: Diagnosis not present

## 2021-09-14 LAB — POCT URINALYSIS DIPSTICK
Spec Grav, UA: 1.015 (ref 1.010–1.025)
pH, UA: 6 (ref 5.0–8.0)

## 2021-09-14 MED ORDER — PHENAZOPYRIDINE HCL 200 MG PO TABS: 200.0000 mg | ORAL_TABLET | Freq: Three times a day (TID) | ORAL | 0 refills | Status: DC | PRN

## 2021-09-14 MED ORDER — SULFAMETHOXAZOLE-TRIMETHOPRIM 800-160 MG PO TABS: 1.0000 | ORAL_TABLET | Freq: Two times a day (BID) | ORAL | 0 refills | Status: DC

## 2021-09-14 NOTE — Progress Notes (Signed)
   Subjective:    Patient ID: Catherine Moore, female    DOB: 08/23/1956, 65 y.o.   MRN: [id number]  HPI Patient arrives with ongoing dysuria x3 weeks. Patient states she was seen in Urgent care and given macrobid and pyridium on week 2. Patient finished medications on Saturday and still having symptoms. Patient stated that her initial urinary symptoms were increased urgency and burning/cramping at the end of her stream. Patient states that macrobid did not help symptoms at all. Patient states that pyridium helps with burning.  This is the second time patient has been treated for UTI this year.   Patient denies blood in her urine, flank pain, fever, chills.     Results for orders placed or performed in visit on 09/14/21  POCT urinalysis dipstick  Result Value Ref Range   Color, UA     Clarity, UA     Glucose, UA     Bilirubin, UA     Ketones, UA     Spec Grav, UA 1.015 1.010 - 1.025   Blood, UA     pH, UA 6.0 5.0 - 8.0   Protein, UA     Urobilinogen, UA     Nitrite, UA     Leukocytes, UA     Appearance     Odor    Leukocytes negative Nitrates negative Urobilinogen 0.2 Protein negative Blood negative Glucose negative Ketones negative Bilirubin negative  Review of Systems  Constitutional:  Negative for chills, fatigue and fever.  Genitourinary:  Positive for dysuria and urgency. Negative for decreased urine volume, difficulty urinating, flank pain, frequency and hematuria.      Objective:   Physical Exam Constitutional:      General: She is not in acute distress.    Appearance: Normal appearance. She is normal weight. She is not ill-appearing or toxic-appearing.  Cardiovascular:     Rate and Rhythm: Normal rate and regular rhythm.     Pulses: Normal pulses.     Heart sounds: Normal heart sounds. No murmur heard.   No friction rub.  Pulmonary:     Effort: Pulmonary effort is normal. No respiratory distress.     Breath sounds: Normal breath sounds. No wheezing.   Abdominal:     Tenderness: There is no right CVA tenderness or left CVA tenderness.  Neurological:     General: No focal deficit present.     Mental Status: She is alert and oriented to person, place, and time.  Psychiatric:        Mood and Affect: Mood normal.        Behavior: Behavior normal.       Assessment & Plan:   1. Dysuria - Drink plenty water.  - POCT urinalysis dipstick - sulfamethoxazole-trimethoprim (BACTRIM DS) 800-160 MG tablet; Take 1 tablet by mouth 2 (two) times daily.  Dispense: 14 tablet; Refill: 0 - S/E of Bactrim reviewed. Patient encouraged to drink plenty water.  - CULTURE, URINE COMPREHENSIVE - phenazopyridine (PYRIDIUM) 200 MG tablet; Take 1 tablet (200 mg total) by mouth 3 (three) times daily as needed for pain.  Dispense: 10 tablet; Refill: 0 - If Urine Cx is negative and patient continues to have symptoms despite being on Bactrim, will consider urology referral.

## 2021-09-14 NOTE — Progress Notes (Signed)
Results reviewed during OV

## 2021-09-21 ENCOUNTER — Encounter: Payer: Self-pay | Admitting: Nurse Practitioner

## 2021-09-21 LAB — CULTURE, URINE COMPREHENSIVE

## 2021-09-22 NOTE — Progress Notes (Signed)
Please contact the patient with the following message. MyChart message was sent but message remains unread.  Hello Mrs. Catherine Moore,  Your Urine culture was positive and the antibiotic that you were started on is effective against the bacteria found in your urine. I hope you are feeling better. If you continue to have symptoms please come back in to be re-evaluated.

## 2021-09-23 ENCOUNTER — Telehealth: Payer: Self-pay | Admitting: Nurse Practitioner

## 2021-09-23 ENCOUNTER — Encounter: Payer: Self-pay | Admitting: Nurse Practitioner

## 2021-09-23 NOTE — Telephone Encounter (Signed)
Patient states has another UTI and would like a referral to urology as discuss at her visit on 09/14/21 if this came back. Please advise

## 2021-09-24 ENCOUNTER — Telehealth: Payer: Self-pay | Admitting: *Deleted

## 2021-09-24 MED ORDER — FLUCONAZOLE 150 MG PO TABS: ORAL_TABLET | ORAL | 0 refills | Status: DC

## 2021-09-24 NOTE — Telephone Encounter (Signed)
Patient sent My chart message sating she was having burning in private area. Seen and treated for recent UTI.  Patient called and stated she thinks she has a yeast vaginitis from her recent antibiotic use. Patient requesting prescription for Diflucan. Consult with Dr Lacinda Axon -Diflucan 153m #2 one tablet 3 days apart. Patient stated if this does not clear her symptoms she will come back to office for a urine recheck  [facility]  Prescription sent electronically to pharmacy.

## 2021-10-07 ENCOUNTER — Other Ambulatory Visit: Payer: Self-pay | Admitting: Nurse Practitioner

## 2021-10-26 ENCOUNTER — Encounter (HOSPITAL_COMMUNITY): Payer: Self-pay | Admitting: Physical Therapy

## 2021-10-26 NOTE — Therapy (Signed)
[place], [place], [address] Phone: [phone]   Fax:  [phone]  Patient Details  Name: Catherine Moore MRN: [id number] Date of Birth: [birth year] Referring Provider:  No ref. provider found  Encounter Date: 10/26/2021  PHYSICAL THERAPY DISCHARGE SUMMARY  Visits from Start of Care: 4  Current functional level related to goals / functional outcomes: NA   Remaining deficits: NA   Education / Equipment: Patient DC per non return    Patient agrees to discharge. Patient goals were not met. Patient is being discharged due to not returning since the last visit.  11:29 AM, 10/26/21 Josue Hector PT DPT  Physical Therapist with [facility]  [phone] [address] [place], [place], [address] Phone: [phone]   Fax:  [phone]

## 2021-10-27 DIAGNOSIS — M542 Cervicalgia: Secondary | ICD-10-CM | POA: Diagnosis not present

## 2021-10-27 DIAGNOSIS — M130 Polyarthritis, unspecified: Secondary | ICD-10-CM | POA: Diagnosis not present

## 2021-10-27 DIAGNOSIS — G4700 Insomnia, unspecified: Secondary | ICD-10-CM | POA: Diagnosis not present

## 2021-10-27 DIAGNOSIS — M545 Low back pain, unspecified: Secondary | ICD-10-CM | POA: Diagnosis not present

## 2021-10-27 DIAGNOSIS — Z79891 Long term (current) use of opiate analgesic: Secondary | ICD-10-CM | POA: Diagnosis not present

## 2021-11-10 ENCOUNTER — Other Ambulatory Visit: Payer: Self-pay

## 2021-11-10 ENCOUNTER — Ambulatory Visit (INDEPENDENT_AMBULATORY_CARE_PROVIDER_SITE_OTHER): Payer: Medicare Other | Admitting: Family Medicine

## 2021-11-10 VITALS — BP 130/70 | HR 86 | Temp 98.2°F | Wt 188.6 lb

## 2021-11-10 DIAGNOSIS — N3000 Acute cystitis without hematuria: Secondary | ICD-10-CM | POA: Diagnosis not present

## 2021-11-10 DIAGNOSIS — R350 Frequency of micturition: Secondary | ICD-10-CM | POA: Diagnosis not present

## 2021-11-10 LAB — POCT URINALYSIS DIPSTICK
Protein, UA: POSITIVE — AB
Spec Grav, UA: >=1.03 — AB (ref 1.010–1.025)
pH, UA: 5 (ref 5.0–8.0)

## 2021-11-10 MED ORDER — FLUCONAZOLE 150 MG PO TABS: 150.0000 mg | ORAL_TABLET | Freq: Once | ORAL | 0 refills | Status: AC

## 2021-11-10 MED ORDER — CEFDINIR 300 MG PO CAPS: 300.0000 mg | ORAL_CAPSULE | Freq: Two times a day (BID) | ORAL | 0 refills | Status: DC

## 2021-11-10 NOTE — Progress Notes (Signed)
Subjective:  Patient ID: Catherine Moore, female    DOB: 03/16/1956  Age: 66 y.o. MRN: [id number]  CC: Chief Complaint  Patient presents with   Urinary Frequency    Pt having frequency with urination, small amount of urine when going. Pt states that when she is urinating "it feels like when you having sex and your'e getting off". Pt has been to Urgent Care and here multiple times with multiple antibiotics. If she takes OTC Pyridum for 3-4 days it goes away but will return     HPI:  66 year old female presents for evaluation of the above.  Patient reports recurrent urinary symptoms which started on Monday. She reports urinary frequency, urgency and dysuria.  No significant abdominal pain, flank pain, worsening back pain.  No fever.  No chills.  No nausea or vomiting.  No resolution with Azo.  Patient Active Problem List   Diagnosis Date Noted   Acute cystitis without hematuria 11/10/2021   NASH (nonalcoholic steatohepatitis) 03/01/2017   Liver cirrhosis secondary to NASH ([place]) 08/03/2016   Hemochromatosis 01/05/2016   Elevated liver enzymes 01/05/2016   Primary osteoarthritis of first carpometacarpal joint of left hand 10/22/2015   Type 2 diabetes mellitus without complication, without long-term current use of insulin ([place]) 10/16/2015   Encounter for long-term opiate analgesic use 07/06/2015   Arthritis of knee, left 01/20/2015   GERD (gastroesophageal reflux disease) 09/27/2013   Pain management 09/27/2013   Anxiety and depression 09/27/2013   Hyperlipemia 02/15/2013   Urinary incontinence in female 02/15/2013    Social Hx   Social History   Socioeconomic History   Marital status: Married    Spouse name: Not on file   Number of children: Not on file   Years of education: Not on file   Highest education level: Not on file  Occupational History   Not on file  Tobacco Use   Smoking status: Former    Packs/day: 1.50    Years: 20.00    Pack years: 30.00    Types:  Cigarettes    Start date: 05/23/1971    Quit date: 06/16/1991    Years since quitting: 30.4   Smokeless tobacco: Never  Vaping Use   Vaping Use: Never used  Substance and Sexual Activity   Alcohol use: No    Alcohol/week: 0.0 standard drinks   Drug use: No   Sexual activity: Yes    Birth control/protection: Post-menopausal, Surgical  Other Topics Concern   Not on file  Social History Narrative   Not on file   Social Determinants of Health   Financial Resource Strain: Not on file  Food Insecurity: Not on file  Transportation Needs: Not on file  Physical Activity: Not on file  Stress: Not on file  Social Connections: Not on file    Review of Systems Per HPI  Objective:  BP 130/70    Pulse 86    Temp 98.2 F (36.8 C)    Wt 188 lb 9.6 oz (85.5 kg)    SpO2 95%    BMI 30.44 kg/m   BP/Weight 11/10/2021 09/14/2021 12/12/3333  Systolic BP 456 256 389  Diastolic BP 70 70 60  Wt. (Lbs) 188.6 191.4 188  BMI 30.44 30.89 30.34    Physical Exam Vitals and nursing note reviewed.  Constitutional:      General: She is not in acute distress.    Appearance: Normal appearance. She is not ill-appearing.  HENT:     Head: Normocephalic and  atraumatic.  Eyes:     General:        Right eye: No discharge.        Left eye: No discharge.     Conjunctiva/sclera: Conjunctivae normal.  Cardiovascular:     Rate and Rhythm: Normal rate and regular rhythm.  Pulmonary:     Effort: Pulmonary effort is normal.     Breath sounds: Normal breath sounds. No wheezing, rhonchi or rales.  Abdominal:     General: There is no distension.     Palpations: Abdomen is soft.     Tenderness: There is no abdominal tenderness.  Neurological:     Mental Status: She is alert.  Psychiatric:        Mood and Affect: Mood normal.        Behavior: Behavior normal.    Lab Results  Component Value Date   WBC 7.2 11/19/2020   HGB 13.3 11/19/2020   HCT 40.7 11/19/2020   PLT 267 11/19/2020   GLUCOSE 81  06/04/2021   CHOL 155 06/04/2021   TRIG 102 06/04/2021   HDL 52 06/04/2021   LDLCALC 84 06/04/2021   ALT 13 06/04/2021   AST 19 06/04/2021   NA 145 (H) 06/04/2021   K 4.5 06/04/2021   CL 104 06/04/2021   CREATININE 0.69 06/04/2021   BUN 13 06/04/2021   CO2 24 06/04/2021   TSH 2.920 04/24/2018   INR 1.0 04/03/2020   HGBA1C 6.2 (H) 06/04/2021    Assessment & Plan:   Problem List Items Addressed This Visit       Genitourinary   Acute cystitis without hematuria - Primary    Sending culture.  Treating with Omnicef.  Diflucan if needed.      Relevant Orders   POCT Urinalysis Dipstick (Completed)   Urine Culture    Meds ordered this encounter  Medications   fluconazole (DIFLUCAN) 150 MG tablet    Sig: Take 1 tablet (150 mg total) by mouth once for 1 dose. Repeat dose in 72 hours.    Dispense:  2 tablet    Refill:  0   cefdinir (OMNICEF) 300 MG capsule    Sig: Take 1 capsule (300 mg total) by mouth 2 (two) times daily.    Dispense:  20 capsule    Refill:  [place]

## 2021-11-10 NOTE — Patient Instructions (Signed)
Antibiotic twice daily as prescribed.  Take care  Dr. Lacinda Axon

## 2021-11-10 NOTE — Assessment & Plan Note (Signed)
Sending culture.  Treating with Omnicef.  Diflucan if needed.

## 2021-11-12 ENCOUNTER — Other Ambulatory Visit: Payer: Self-pay | Admitting: Family Medicine

## 2021-11-12 ENCOUNTER — Telehealth: Payer: Self-pay | Admitting: *Deleted

## 2021-11-12 LAB — URINE CULTURE

## 2021-11-12 LAB — SPECIMEN STATUS REPORT

## 2021-11-12 MED ORDER — OXYBUTYNIN CHLORIDE ER 5 MG PO TB24: 5.0000 mg | ORAL_TABLET | Freq: Every day | ORAL | 1 refills | Status: DC

## 2021-11-12 NOTE — Telephone Encounter (Signed)
Patient says she is still having spasms when she goes to the bathroom. She says it feels like electricity going up her body.  She says she asked a pharmacist what could used to help with and they told her Oxybutin ER. She would like this sent to the pharmacy for her.  Please advise. Thank you.   [phone]

## 2021-11-12 NOTE — Telephone Encounter (Signed)
Patient informed rx sent. Verbalized understanding

## 2021-11-18 ENCOUNTER — Other Ambulatory Visit: Payer: Self-pay

## 2021-11-18 ENCOUNTER — Encounter: Payer: Self-pay | Admitting: Family Medicine

## 2021-11-18 ENCOUNTER — Ambulatory Visit (INDEPENDENT_AMBULATORY_CARE_PROVIDER_SITE_OTHER): Payer: Medicare Other | Admitting: Family Medicine

## 2021-11-18 VITALS — BP 126/80 | HR 86 | Temp 97.7°F | Ht 66.0 in | Wt 187.0 lb

## 2021-11-18 DIAGNOSIS — H9201 Otalgia, right ear: Secondary | ICD-10-CM | POA: Insufficient documentation

## 2021-11-18 DIAGNOSIS — E7800 Pure hypercholesterolemia, unspecified: Secondary | ICD-10-CM | POA: Diagnosis not present

## 2021-11-18 DIAGNOSIS — K7460 Unspecified cirrhosis of liver: Secondary | ICD-10-CM

## 2021-11-18 DIAGNOSIS — R232 Flushing: Secondary | ICD-10-CM | POA: Insufficient documentation

## 2021-11-18 DIAGNOSIS — E119 Type 2 diabetes mellitus without complications: Secondary | ICD-10-CM | POA: Diagnosis not present

## 2021-11-18 DIAGNOSIS — R350 Frequency of micturition: Secondary | ICD-10-CM | POA: Insufficient documentation

## 2021-11-18 DIAGNOSIS — K7581 Nonalcoholic steatohepatitis (NASH): Secondary | ICD-10-CM | POA: Diagnosis not present

## 2021-11-18 DIAGNOSIS — R300 Dysuria: Secondary | ICD-10-CM | POA: Insufficient documentation

## 2021-11-18 LAB — POCT URINALYSIS DIP (MANUAL ENTRY)
Bilirubin, UA: NEGATIVE
Blood, UA: NEGATIVE
Glucose, UA: NEGATIVE mg/dL
Ketones, POC UA: NEGATIVE mg/dL
Leukocytes, UA: NEGATIVE
Nitrite, UA: NEGATIVE
Spec Grav, UA: >=1.03 — AB (ref 1.010–1.025)
Urobilinogen, UA: 0.2 E.U./dL
pH, UA: 6 (ref 5.0–8.0)

## 2021-11-18 MED ORDER — FREESTYLE LIBRE 2 SENSOR MISC: 11 refills | Status: DC

## 2021-11-18 NOTE — Progress Notes (Signed)
Subjective:  Patient ID: Catherine Moore, female    DOB: 05-13-1956  Age: 66 y.o. MRN: [id number]  CC: Chief Complaint  Patient presents with   Urinary Frequency    W/ small amounts and sensitivity    Diabetes   Hyperlipidemia    HPI:  66 year old female with an extensive past medical history including Karlene Lineman and secondary cirrhosis, GERD, type 2 diabetes, hyperlipidemia, anxiety and depression presents for follow-up.  Patient states that she continues to have urinary symptoms.  Recently seen by me.  Urine culture was negative.  Patient states that she continues to have urinary frequency and some spasms at times.  States that she has been taking the oxybutynin and it has not helped.  She states that she recently restarted the antibiotic.  I advised against this given recent culture which grew urogenital flora.  Patient's type 2 diabetes has been well controlled.  She is currently on glipizide and metformin.  Tolerating well.  No reports of hypoglycemia.  Patient would like to try continuous glucose monitor.  Hyperlipidemia has been stable.  She is on Crestor and tolerating well.  Patient has not had recent follow-up with gastroenterology.  I advised her that she needs close follow-up given her history of cirrhosis.  Additionally patient states that she has had some discomfort from her right ear.  She would like this examined today.  Lastly, she reports intermittent flushing/hot flashes.  She states that this has been going on for 2 to 3 years.  She denies any other associated symptoms.  She states that she gets flushed and sweats and this lasts several minutes and resolves after using a fan.  Patient Active Problem List   Diagnosis Date Noted   Urinary frequency 11/18/2021   Flushing 11/18/2021   Right ear pain 11/18/2021   NASH (nonalcoholic steatohepatitis) 03/01/2017   Liver cirrhosis secondary to NASH ([place]) 08/03/2016   Type 2 diabetes mellitus without complication, without  long-term current use of insulin ([place]) 10/16/2015   GERD (gastroesophageal reflux disease) 09/27/2013   Pain management 09/27/2013   Anxiety and depression 09/27/2013   Hyperlipidemia 02/15/2013    Social Hx   Social History   Socioeconomic History   Marital status: Married    Spouse name: Not on file   Number of children: Not on file   Years of education: Not on file   Highest education level: Not on file  Occupational History   Not on file  Tobacco Use   Smoking status: Former    Packs/day: 1.50    Years: 20.00    Pack years: 30.00    Types: Cigarettes    Start date: 05/23/1971    Quit date: 06/16/1991    Years since quitting: 30.4   Smokeless tobacco: Never  Vaping Use   Vaping Use: Never used  Substance and Sexual Activity   Alcohol use: No    Alcohol/week: 0.0 standard drinks   Drug use: No   Sexual activity: Yes    Birth control/protection: Post-menopausal, Surgical  Other Topics Concern   Not on file  Social History Narrative   Not on file   Social Determinants of Health   Financial Resource Strain: Not on file  Food Insecurity: Not on file  Transportation Needs: Not on file  Physical Activity: Not on file  Stress: Not on file  Social Connections: Not on file    Review of Systems  Constitutional: Negative.   Genitourinary:  Positive for frequency.   Objective:  BP  126/80    Pulse 86    Temp 97.7 F (36.5 C)    Ht 5' 6"  (1.676 m)    Wt 187 lb (84.8 kg)    SpO2 95%    BMI 30.18 kg/m   BP/Weight 11/18/2021 11/10/2021 04/15/1600  Systolic BP 093 235 573  Diastolic BP 80 70 70  Wt. (Lbs) 187 188.6 191.4  BMI 30.18 30.44 30.89    Physical Exam Constitutional:      General: She is not in acute distress.    Appearance: Normal appearance. She is not ill-appearing.  HENT:     Ears:     Comments: Right TM partially obscured by cerumen. Eyes:     General:        Right eye: No discharge.        Left eye: No discharge.     Conjunctiva/sclera:  Conjunctivae normal.  Cardiovascular:     Rate and Rhythm: Normal rate and regular rhythm.  Pulmonary:     Effort: Pulmonary effort is normal.     Breath sounds: Normal breath sounds. No wheezing, rhonchi or rales.  Neurological:     Mental Status: She is alert.  Psychiatric:        Mood and Affect: Mood normal.        Behavior: Behavior normal.    Lab Results  Component Value Date   WBC 7.2 11/19/2020   HGB 13.3 11/19/2020   HCT 40.7 11/19/2020   PLT 267 11/19/2020   GLUCOSE 81 06/04/2021   CHOL 155 06/04/2021   TRIG 102 06/04/2021   HDL 52 06/04/2021   LDLCALC 84 06/04/2021   ALT 13 06/04/2021   AST 19 06/04/2021   NA 145 (H) 06/04/2021   K 4.5 06/04/2021   CL 104 06/04/2021   CREATININE 0.69 06/04/2021   BUN 13 06/04/2021   CO2 24 06/04/2021   TSH 2.920 04/24/2018   INR 1.0 04/03/2020   HGBA1C 6.2 (H) 06/04/2021     Assessment & Plan:   Problem List Items Addressed This Visit       Cardiovascular and Mediastinum   Flushing    Labs ordered including TSH.  No red flags or other associated symptoms.  Could be related to postmenopausal hot flashes although she has been postmenopausal for years.  We will continue to monitor.      Relevant Orders   TSH     Digestive   Liver cirrhosis secondary to NASH [facility])    Advised need for close follow-up with GI.      Relevant Orders   CBC   CMP14+EGFR     Endocrine   Type 2 diabetes mellitus without complication, without long-term current use of insulin ([place])    Has been stable.  A1c ordered.  Continue glipizide and metformin.  We will attempt to see if we can get CGM approved.      Relevant Orders   Hemoglobin A1c     Other   Hyperlipidemia    Has been well controlled.  Lipid panel ordered.  Continue Crestor.      Relevant Orders   Lipid panel   Urinary frequency - Primary    Persistent symptoms.  No evidence of UTI.  Repeat urine today is not consistent with UTI.  She has not responded to oxybutynin.   Referring to urology.      Relevant Orders   POCT urinalysis dipstick (Completed)   Right ear pain    Exam notable for partial cerumen impaction.  Advised use of Debrox.       Follow-up:  Return in about 6 months (around 05/18/2022).  [place]

## 2021-11-18 NOTE — Assessment & Plan Note (Signed)
Has been stable.  A1c ordered.  Continue glipizide and metformin.  We will attempt to see if we can get CGM approved.

## 2021-11-18 NOTE — Patient Instructions (Signed)
Labs today.  I am placing referral to Urology.  Use Debrox for the ear. No evidence of infection.  Get back with GI.  Take care  Dr. Lacinda Axon

## 2021-11-18 NOTE — Assessment & Plan Note (Signed)
Persistent symptoms.  No evidence of UTI.  Repeat urine today is not consistent with UTI.  She has not responded to oxybutynin.  Referring to urology.

## 2021-11-18 NOTE — Assessment & Plan Note (Signed)
Labs ordered including TSH.  No red flags or other associated symptoms.  Could be related to postmenopausal hot flashes although she has been postmenopausal for years.  We will continue to monitor.

## 2021-11-18 NOTE — Assessment & Plan Note (Signed)
Advised need for close follow-up with GI.

## 2021-11-18 NOTE — Assessment & Plan Note (Signed)
Exam notable for partial cerumen impaction.  Advised use of Debrox.

## 2021-11-18 NOTE — Assessment & Plan Note (Signed)
Has been well controlled.  Lipid panel ordered.  Continue Crestor.

## 2021-11-19 LAB — CMP14+EGFR
ALT: 15 IU/L (ref 0–32)
AST: 16 IU/L (ref 0–40)
Albumin/Globulin Ratio: 2.4 — ABNORMAL HIGH (ref 1.2–2.2)
Albumin: 4.7 g/dL (ref 3.8–4.8)
Alkaline Phosphatase: 57 IU/L (ref 44–121)
BUN/Creatinine Ratio: 14 (ref 12–28)
BUN: 12 mg/dL (ref 8–27)
Bilirubin Total: 0.5 mg/dL (ref 0.0–1.2)
CO2: 27 mmol/L (ref 20–29)
Calcium: 9.4 mg/dL (ref 8.7–10.3)
Chloride: 101 mmol/L (ref 96–106)
Creatinine, Ser: 0.84 mg/dL (ref 0.57–1.00)
Globulin, Total: 2 g/dL (ref 1.5–4.5)
Glucose: 121 mg/dL — ABNORMAL HIGH (ref 70–99)
Potassium: 4.5 mmol/L (ref 3.5–5.2)
Sodium: 141 mmol/L (ref 134–144)
Total Protein: 6.7 g/dL (ref 6.0–8.5)
eGFR: 77 mL/min/{1.73_m2} (ref >59)

## 2021-11-19 LAB — CBC
Hematocrit: 37.6 % (ref 34.0–46.6)
Hemoglobin: 12.8 g/dL (ref 11.1–15.9)
MCH: 29.1 pg (ref 26.6–33.0)
MCHC: 34 g/dL (ref 31.5–35.7)
MCV: 86 fL (ref 79–97)
Platelets: 234 10*3/uL (ref 150–450)
RBC: 4.4 x10E6/uL (ref 3.77–5.28)
RDW: 12.5 % (ref 11.7–15.4)
WBC: 6.3 10*3/uL (ref 3.4–10.8)

## 2021-11-19 LAB — LIPID PANEL
Chol/HDL Ratio: 3 ratio (ref 0.0–4.4)
Cholesterol, Total: 145 mg/dL (ref 100–199)
HDL: 49 mg/dL (ref >39)
LDL Chol Calc (NIH): 78 mg/dL (ref 0–99)
Triglycerides: 94 mg/dL (ref 0–149)
VLDL Cholesterol Cal: 18 mg/dL (ref 5–40)

## 2021-11-19 LAB — HEMOGLOBIN A1C
Est. average glucose Bld gHb Est-mCnc: 120 mg/dL
Hgb A1c MFr Bld: 5.8 % — ABNORMAL HIGH (ref 4.8–5.6)

## 2021-11-19 LAB — TSH: TSH: 1.8 u[IU]/mL (ref 0.450–4.500)

## 2021-11-23 ENCOUNTER — Other Ambulatory Visit: Payer: Self-pay | Admitting: Family Medicine

## 2021-11-23 ENCOUNTER — Telehealth: Payer: Self-pay | Admitting: Family Medicine

## 2021-11-23 DIAGNOSIS — R350 Frequency of micturition: Secondary | ICD-10-CM

## 2021-11-23 NOTE — Telephone Encounter (Signed)
Pt saw Dr Lacinda Axon on Thursday 11/18/21. He told her he would refer her to a urologist. Pt has not heard back and states she feels an urgency to use the restroom every 10 minutes and is having some pain. Pt stated that a detailed msg could be left on her VM as she does not always know her phone is ringing when she is at work.   [phone]

## 2021-11-23 NOTE — Telephone Encounter (Signed)
Will place referral. Does pt NTBS since having frequency? Please advise. Thank you

## 2021-11-23 NOTE — Telephone Encounter (Signed)
Left message to return call. Also sent my chart message

## 2021-11-24 NOTE — Telephone Encounter (Signed)
Pt replied via my chart

## 2021-11-25 ENCOUNTER — Other Ambulatory Visit: Payer: Self-pay

## 2021-11-25 ENCOUNTER — Ambulatory Visit (INDEPENDENT_AMBULATORY_CARE_PROVIDER_SITE_OTHER): Payer: Medicare Other | Admitting: Physician Assistant

## 2021-11-25 VITALS — BP 117/65 | HR 92

## 2021-11-25 DIAGNOSIS — N3080 Other cystitis without hematuria: Secondary | ICD-10-CM | POA: Diagnosis not present

## 2021-11-25 DIAGNOSIS — R350 Frequency of micturition: Secondary | ICD-10-CM

## 2021-11-25 DIAGNOSIS — N3946 Mixed incontinence: Secondary | ICD-10-CM

## 2021-11-25 DIAGNOSIS — N3281 Overactive bladder: Secondary | ICD-10-CM | POA: Diagnosis not present

## 2021-11-25 LAB — MICROSCOPIC EXAMINATION
RBC, Urine: NONE SEEN /hpf (ref 0–2)
Renal Epithel, UA: NONE SEEN /hpf

## 2021-11-25 LAB — URINALYSIS, ROUTINE W REFLEX MICROSCOPIC
Bilirubin, UA: NEGATIVE
Glucose, UA: NEGATIVE
Ketones, UA: NEGATIVE
Leukocytes,UA: NEGATIVE
Nitrite, UA: POSITIVE — AB
Protein,UA: NEGATIVE
RBC, UA: NEGATIVE
Specific Gravity, UA: 1.02 (ref 1.005–1.030)
Urobilinogen, Ur: 4 mg/dL — ABNORMAL HIGH (ref 0.2–1.0)
pH, UA: 5.5 (ref 5.0–7.5)

## 2021-11-25 LAB — BLADDER SCAN AMB NON-IMAGING: Scan Result: 0

## 2021-11-25 NOTE — Progress Notes (Signed)
11/25/2021 9:33 AM   Catherine Moore 04-May-1956 [id number]    1. Urinary frequency   2. Mixed incontinence urge and stress   3. OAB (overactive bladder)   - Urinalysis, Routine w reflex microscopic - BLADDER SCAN AMB NON-IMAGING   Plan: MDX culture ordered.  Will withhold antibiotics until culture results available.  Patient may hold oxybutynin at this time and will start Myrbetriq 25 mg daily.  28 samples given and side effects discussed at length.  Discussed timed voiding and voiding diary.  She will follow-up in 4 to 6 weeks for further evaluation including urinalysis and PVR.  Should she develop symptoms of pain, fever, chills, gross hematuria, she will return sooner.  Patient agrees and conveys understanding.  All questions were answered.  Chief Complaint:  Urinary frequency  Referring provider: Coral Spikes, DO [place],  [place] [address]   History of Present Illness Catherine Moore is a 66 y.o. year old female who is seen in consultation from [place], DO for evaluation of persistent urinary frequency, urgency, dysuria, nocturia, stress and urge incontinence which was sudden in onset approximately 3 to 4 weeks ago.  She has been treated with multiple antibiotics at various urgent cares without release of symptoms and urine culture from 11/10/2021 grew few colonies of mixed urogenital flora.  She did have Klebsiella UTI in November last year.  She was placed on oxybutynin with no release of her symptoms.  Currently, she continues to have significant urgency with stress and urge incontinence symptoms.  She describes the urgency as a "spasm similar to an orgasm".  She denies fever, chills, nausea or vomiting.  No abdominal pain and no gross hematuria.  No history of urinary stone disease. Past medical hx significant for rheumatoid arthritis, Karlene Lineman and secondary cirrhosis, GERD, type 2 diabetes, hyperlipidemia, anxiety and depression. PVR = 0 mL Urinalysis is  nitrite positive with 0-5 white blood cells and few bacteria.  Past Medical History:  Past Medical History:  Diagnosis Date   Anxiety    Arthritis    Cirrhosis ([place])    Complication of anesthesia    Depression    Diabetes ([place])    GERD (gastroesophageal reflux disease)    Hyperlipidemia    PONV (postoperative nausea and vomiting)     Past Surgical History:  Past Surgical History:  Procedure Laterality Date   BIOPSY  08/16/2016   Procedure: BIOPSY;  Surgeon: Daneil Dolin, MD;  [facility];  Service: Endoscopy;;   BIOPSY  08/16/2018   Procedure: BIOPSY;  Surgeon: Daneil Dolin, MD;  [facility];  Service: Endoscopy;;  gastric   COLONOSCOPY  08/09/2010   IRW:ERXVQM rectum colon   COLONOSCOPY WITH PROPOFOL N/A 04/07/2020   Procedure: COLONOSCOPY WITH PROPOFOL;  Surgeon: Daneil Dolin, MD;  [facility];  Service: Endoscopy;  Laterality: N/A;  7:30am   ENDOMETRIAL ABLATION     ESOPHAGOGASTRODUODENOSCOPY N/A 08/16/2016   Dr. Gala Romney: normal esophagus, GAVE, multiple gastric polyps s/p biopsy (hyperplastic), normal duodenum    ESOPHAGOGASTRODUODENOSCOPY (EGD) WITH PROPOFOL N/A 08/16/2018   Rourk: Multiple gastric polyps, pathology revealed fundic gland, gastric biopsies negative for H. pylori.   IR GENERIC HISTORICAL  06/13/2016   IR TRANSCATHETER BX 06/13/2016 [facility]   IR GENERIC HISTORICAL  06/13/2016   IR US GUIDE VASC ACCESS RIGHT 06/13/2016 [facility]   IR GENERIC HISTORICAL  06/13/2016   IR VENOGRAM HEPATIC W HEMODYNAMIC EVALUATION  06/13/2016 [facility]   POLYPECTOMY  04/07/2020   Procedure: POLYPECTOMY;  Surgeon: Daneil Dolin, MD;  [facility];  Service: Endoscopy;;    Allergies:  Allergies  Allergen Reactions   Zocor [Simvastatin] Nausea Only    Pt's Dr took pt off Zocor due to nausea    Penicillins Swelling and Rash    Has patient had a PCN reaction causing immediate rash, facial/tongue/throat swelling, SOB  or lightheadedness with hypotension:Yes Has patient had a PCN reaction causing severe rash involving mucus membranes or skin necrosis:NO Has patient had a PCN reaction that required hospitalization No Has patient had a PCN reaction occurring within the last 10 years: No If all of the above answers are "NO", then may proceed with Cephalosporin use.     Family History:  Family History  Problem Relation Age of Onset   Diabetes Father    Hypertension Father    Heart attack Father 36       MI   Stroke Father    Osteoporosis Mother    Arthritis Sister    Arthritis Brother    Colon cancer Neg Hx    Liver disease Neg Hx     Social History:  Social History   Tobacco Use   Smoking status: Former    Packs/day: 1.50    Years: 20.00    Pack years: 30.00    Types: Cigarettes    Start date: 05/23/1971    Quit date: 06/16/1991    Years since quitting: 30.4   Smokeless tobacco: Never  Vaping Use   Vaping Use: Never used  Substance Use Topics   Alcohol use: No    Alcohol/week: 0.0 standard drinks   Drug use: No    Review of symptoms:  Constitutional:  Negative for unexplained weight loss, night sweats, fever, chills ENT:  Negative for nose bleeds, sinus pain, painful swallowing CV:  Negative for chest pain, shortness of breath, exercise intolerance, palpitations, loss of consciousness Resp:  Negative for cough, wheezing, shortness of breath GI:  Negative for nausea, vomiting, diarrhea, bloody stools GU:  Positives noted in HPI; otherwise negative for gross hematuria Neuro:  Negative for seizures, poor balance, limb weakness, slurred speech Psych:  Negative for lack of energy Endocrine:  Negative for polydipsia, polyuria, symptoms of hypoglycemia (dizziness, hunger, sweating) Hematologic:  Negative for anemia, purpura, petechia, prolonged or excessive bleeding, use of anticoagulants  Allergic:  Negative for difficulty breathing or choking as a result of exposure to anything; no  shellfish allergy; no allergic response (rash/itch) to materials, foods   Physical Exam: BP 117/65    Pulse 92   Constitutional:  Alert and oriented, No acute distress. HEENT: NCAT, moist mucus membranes.  Trachea midline, no masses. Cardiovascular: Regular rate and rhythm without murmur, rub, or gallops No clubbing, cyanosis, or edema. Respiratory: Normal respiratory effort, clear to auscultation bilaterally GI: Abdomen is soft, nontender, nondistended, no abdominal masses BACK:  Non-tender to palpation.  No CVAT Lymph: No cervical or inguinal lymphadenopathy. Skin: No obvious rashes, warm, dry, intact Neurologic: Alert and oriented, Cranial nerves grossly intact, no focal deficits, moving all 4 extremities. Psychiatric: Appropriate. Normal mood and affect.  Laboratory Data: Results for orders placed or performed in visit on 11/25/21 (from the past 24 hour(s))  BLADDER SCAN AMB NON-IMAGING   Collection Time: 11/25/21  9:32 AM  Result Value Ref Range   Scan Result 0     Lab Results  Component Value Date   WBC 6.3 11/18/2021  HGB 12.8 11/18/2021   HCT 37.6 11/18/2021   MCV 86 11/18/2021   PLT 234 11/18/2021    Lab Results  Component Value Date   CREATININE 0.84 11/18/2021    No results found for: PSA  No results found for: TESTOSTERONE  Lab Results  Component Value Date   HGBA1C 5.8 (H) 11/18/2021    Urinalysis    Component Value Date/Time   BILIRUBINUR negative 11/18/2021 0848   BILIRUBINUR + 11/10/2021 1543   KETONESUR negative 11/18/2021 0848   PROTEINUR trace (A) 11/18/2021 0848   PROTEINUR Positive (A) 11/10/2021 1543   UROBILINOGEN 0.2 11/18/2021 0848   NITRITE Negative 11/18/2021 0848   LEUKOCYTESUR Negative 11/18/2021 0848    Lab Results  Component Value Date   LABMICR 15.6 11/19/2020    Pertinent Imaging:  No results found for this or any previous visit.  No results found for this or any previous visit.     [place], Berneice Heinrich, PA-C [facility] [place]

## 2021-11-25 NOTE — Progress Notes (Signed)
post void residual=0  MDX cx sent Tack# [id number] Pickup# [id number]

## 2021-11-26 DIAGNOSIS — N3281 Overactive bladder: Secondary | ICD-10-CM | POA: Insufficient documentation

## 2021-11-26 MED ORDER — MIRABEGRON ER 25 MG PO TB24
25.0000 mg | ORAL_TABLET | Freq: Every day | ORAL | 0 refills | Status: DC
Start: 2021-11-26 — End: 2022-03-16

## 2021-11-29 ENCOUNTER — Telehealth: Payer: Self-pay

## 2021-11-29 ENCOUNTER — Other Ambulatory Visit: Payer: Self-pay | Admitting: Physician Assistant

## 2021-11-29 MED ORDER — SULFAMETHOXAZOLE-TRIMETHOPRIM 800-160 MG PO TABS: 1.0000 | ORAL_TABLET | Freq: Two times a day (BID) | ORAL | 0 refills | Status: DC

## 2021-11-29 MED ORDER — ESTRADIOL 0.1 MG/GM VA CREA
TOPICAL_CREAM | VAGINAL | 12 refills | Status: AC
Start: 2021-11-29 — End: ?

## 2021-11-29 NOTE — Progress Notes (Signed)
Awaiting urine cx. Rx for Bactrim DS sent to pharmacy.

## 2021-11-29 NOTE — Progress Notes (Signed)
Estrace cream sent to pharm.

## 2021-11-29 NOTE — Telephone Encounter (Signed)
Spoke with Sharee Pimple concerning patient calling still having urinary symptoms despite using myrbetriq samples. MDX urine culture pending.  Sharee Pimple wrote for bactrim pending culture result.  Patient voiced understanding of medication sent to pharmacy.

## 2021-11-29 NOTE — Telephone Encounter (Signed)
Pt was called and a vm was left asking her to give the office a call back.

## 2021-11-29 NOTE — Telephone Encounter (Signed)
Patient called and left voicemail advising she was seen in office 11/25/21 and wanted to confirm if a cream is being sent to pharmacy.  Pharmacy:  [facility], [place]

## 2021-11-30 ENCOUNTER — Telehealth: Payer: Self-pay

## 2021-11-30 NOTE — Telephone Encounter (Signed)
-----   Message from Reynaldo Minium, [place] sent at 11/30/2021 12:45 PM EST ----- Please let the patient know that her urine culture results indicates no bacterial growth.  She may discontinue the Bactrim DS at this time. ----- Message ----- From: Dorisann Frames, RN Sent: 11/30/2021   9:10 AM EST To: Iris Pert, LPN, #  MDX urine culture

## 2021-11-30 NOTE — Telephone Encounter (Signed)
Patient called and made aware.   Patient states that she continues to have the same problem. She notices it more when she is standing and is wondering if her bladder has dropped. Patient has scheduled f/u on 12/23/21 Please advise.

## 2021-12-03 ENCOUNTER — Ambulatory Visit: Payer: Medicare Other | Admitting: Urology

## 2021-12-03 ENCOUNTER — Ambulatory Visit (HOSPITAL_COMMUNITY)
Admission: RE | Admit: 2021-12-03 | Discharge: 2021-12-03 | Disposition: A | Discharge status: Home / Self Care | Payer: Medicare Other | Source: Ambulatory Visit | Attending: Urology | Admitting: Urology

## 2021-12-03 ENCOUNTER — Encounter: Payer: Self-pay | Admitting: Urology

## 2021-12-03 ENCOUNTER — Other Ambulatory Visit: Payer: Self-pay

## 2021-12-03 VITALS — BP 123/71 | HR 91

## 2021-12-03 DIAGNOSIS — R1030 Lower abdominal pain, unspecified: Secondary | ICD-10-CM | POA: Insufficient documentation

## 2021-12-03 DIAGNOSIS — I700 Atherosclerosis of aorta: Secondary | ICD-10-CM | POA: Diagnosis not present

## 2021-12-03 DIAGNOSIS — R350 Frequency of micturition: Secondary | ICD-10-CM

## 2021-12-03 DIAGNOSIS — Z87442 Personal history of urinary calculi: Secondary | ICD-10-CM | POA: Diagnosis not present

## 2021-12-03 LAB — URINALYSIS, ROUTINE W REFLEX MICROSCOPIC
Bilirubin, UA: NEGATIVE
Glucose, UA: NEGATIVE
Leukocytes,UA: NEGATIVE
Nitrite, UA: NEGATIVE
Protein,UA: NEGATIVE
RBC, UA: NEGATIVE
Specific Gravity, UA: >1.03 — ABNORMAL HIGH (ref 1.005–1.030)
Urobilinogen, Ur: 1 mg/dL (ref 0.2–1.0)
pH, UA: 5.5 (ref 5.0–7.5)

## 2021-12-03 MED ORDER — CIPROFLOXACIN HCL 500 MG PO TABS
500.0000 mg | ORAL_TABLET | Freq: Once | ORAL | Status: AC
  Administered 2021-12-03: 500 mg via ORAL

## 2021-12-03 NOTE — Progress Notes (Signed)
° °  12/03/21  CC: urinary urgency and pelvic pain   HPI: Ms Mahn is a 66yo here for followup for urinary frequency, urgency and pelvic refractory to Beta3 and anticholinergic therapy Blood pressure 123/71, pulse 91. NED. A&Ox3.   No respiratory distress   Abd soft, NT, ND Normal external genitalia with patent urethral meatus  Cystoscopy Procedure Note  Patient identification was confirmed, informed consent was obtained, and patient was prepped using Betadine solution.  Lidocaine jelly was administered per urethral meatus.    Procedure: - Flexible cystoscope introduced, without any difficulty.   - Thorough search of the bladder revealed:    normal urethral meatus    normal urothelium    no stones    no ulcers     no tumors    no urethral polyps    no trabeculation  - Ureteral orifices were normal in position and appearance.  Post-Procedure: - Patient tolerated the procedure well  Assessment/ Plan: CT stone study, will call with results   No follow-ups on file.  Nicolette Bang, MD

## 2021-12-03 NOTE — Patient Instructions (Signed)

## 2021-12-06 ENCOUNTER — Other Ambulatory Visit: Payer: Self-pay | Admitting: Family Medicine

## 2021-12-06 ENCOUNTER — Encounter: Payer: Self-pay | Admitting: Internal Medicine

## 2021-12-23 ENCOUNTER — Ambulatory Visit: Payer: Medicare Other | Admitting: Physician Assistant

## 2021-12-27 ENCOUNTER — Ambulatory Visit: Payer: Medicare Other | Admitting: Physician Assistant

## 2021-12-27 ENCOUNTER — Other Ambulatory Visit: Payer: Self-pay

## 2021-12-27 VITALS — BP 142/78 | HR 85 | Ht 66.0 in | Wt 189.0 lb

## 2021-12-27 DIAGNOSIS — M6289 Other specified disorders of muscle: Secondary | ICD-10-CM | POA: Diagnosis not present

## 2021-12-27 DIAGNOSIS — R350 Frequency of micturition: Secondary | ICD-10-CM

## 2021-12-27 DIAGNOSIS — N3000 Acute cystitis without hematuria: Secondary | ICD-10-CM

## 2021-12-27 LAB — URINALYSIS, ROUTINE W REFLEX MICROSCOPIC
Bilirubin, UA: NEGATIVE
Glucose, UA: NEGATIVE
Ketones, UA: NEGATIVE
Nitrite, UA: NEGATIVE
RBC, UA: NEGATIVE
Specific Gravity, UA: >1.03 — ABNORMAL HIGH (ref 1.005–1.030)
Urobilinogen, Ur: 1 mg/dL (ref 0.2–1.0)
pH, UA: 5.5 (ref 5.0–7.5)

## 2021-12-27 LAB — MICROSCOPIC EXAMINATION
RBC, Urine: NONE SEEN /HPF (ref 0–2)
Renal Epithel, UA: NONE SEEN /HPF

## 2021-12-27 LAB — BLADDER SCAN AMB NON-IMAGING: Scan Result: 0

## 2021-12-27 MED ORDER — SULFAMETHOXAZOLE-TRIMETHOPRIM 800-160 MG PO TABS: 1.0000 | ORAL_TABLET | Freq: Two times a day (BID) | ORAL | 0 refills | Status: DC

## 2021-12-27 NOTE — Progress Notes (Signed)
Assessment: 1. Urinary frequency   2. Acute cystitis without hematuria   3. Pelvic floor dysfunction in female     Plan: Urine for culture. Bactrim to pharmacy.  Will call patient with results and adjust therapy if needed.  Pelvic floor PT ordered.  She will continue Myrbetriq twice daily and samples given.  She will follow-up in 3 months for urinalysis and PVR.  Chief Complaint: No chief complaint on file.   HPI: Catherine Moore is a 66 y.o. female who presents for continued evaluation of pelvic floor pain, urinary frequency, urgency, burning and incontinence.  No fever, chills, gross hematuria.  Patient continues to have difficulty at work where she does a lot of standing which makes her urgency and incontinence symptoms worse.  She continues Myrbetriq 25 mg twice daily. Cystoscopic exam 1 month ago unremarkable and recent CT stone study indicated no evidence of urinary tract calculus, hydronephrosis, obstruction.  Possible pelvic floor laxity noted by radiology. PVR = 0 mL UA = 11-30 WBCs, few bacteria, nitrite negative  Portions of the above documentation were copied from a prior visit for review purposes only.  Allergies: Allergies  Allergen Reactions   Zocor [Simvastatin] Nausea Only    Pt's Dr took pt off Zocor due to nausea    Penicillins Swelling and Rash    Has patient had a PCN reaction causing immediate rash, facial/tongue/throat swelling, SOB or lightheadedness with hypotension:Yes Has patient had a PCN reaction causing severe rash involving mucus membranes or skin necrosis:NO Has patient had a PCN reaction that required hospitalization No Has patient had a PCN reaction occurring within the last 10 years: No If all of the above answers are "NO", then may proceed with Cephalosporin use.     PMH: Past Medical History:  Diagnosis Date   Anxiety    Arthritis    Cirrhosis (HCC)    Complication of anesthesia    Depression    Diabetes (HCC)    GERD  (gastroesophageal reflux disease)    Hyperlipidemia    PONV (postoperative nausea and vomiting)     PSH: Past Surgical History:  Procedure Laterality Date   BIOPSY  08/16/2016   Procedure: BIOPSY;  Surgeon: Corbin Ade, MD;  [facility];  Service: Endoscopy;;   BIOPSY  08/16/2018   Procedure: BIOPSY;  Surgeon: Corbin Ade, MD;  [facility];  Service: Endoscopy;;  gastric   COLONOSCOPY  08/09/2010   [id number] rectum colon   COLONOSCOPY WITH PROPOFOL N/A 04/07/2020   Procedure: COLONOSCOPY WITH PROPOFOL;  Surgeon: Corbin Ade, MD;  [facility];  Service: Endoscopy;  Laterality: N/A;  7:30am   ENDOMETRIAL ABLATION     ESOPHAGOGASTRODUODENOSCOPY N/A 08/16/2016   Dr. Jena Gauss: normal esophagus, GAVE, multiple gastric polyps s/p biopsy (hyperplastic), normal duodenum    ESOPHAGOGASTRODUODENOSCOPY (EGD) WITH PROPOFOL N/A 08/16/2018   Rourk: Multiple gastric polyps, pathology revealed fundic gland, gastric biopsies negative for H. pylori.   IR GENERIC HISTORICAL  06/13/2016   IR TRANSCATHETER BX 06/13/2016 [facility]   IR GENERIC HISTORICAL  06/13/2016   IR US GUIDE VASC ACCESS RIGHT 06/13/2016 [facility]   IR GENERIC HISTORICAL  06/13/2016   IR VENOGRAM HEPATIC W HEMODYNAMIC EVALUATION 06/13/2016 [facility]   POLYPECTOMY  04/07/2020   Procedure: POLYPECTOMY;  Surgeon: Corbin Ade, MD;  [facility];  Service: Endoscopy;;    SH: Social History   Tobacco Use   Smoking status: Former  Packs/day: 1.50    Years: 20.00    Pack years: 30.00    Types: Cigarettes    Start date: 05/23/1971    Quit date: 06/16/1991    Years since quitting: 30.5   Smokeless tobacco: Never  Vaping Use   Vaping Use: Never used  Substance Use Topics   Alcohol use: No    Alcohol/week: 0.0 standard drinks   Drug use: No    ROS: Constitutional:  Negative for fever, chills, weight loss CV: Negative for chest pain, previous MI,  hypertension Respiratory:  Negative for shortness of breath, wheezing, sleep apnea, frequent cough GI:  Negative for nausea, vomiting, bloody stool, GERD  PE: BP (!) 142/78   Pulse 85   Ht 5\' 6"  (1.676 m)   Wt 189 lb (85.7 kg)   BMI 30.51 kg/m  GENERAL APPEARANCE:  Well appearing, well developed, well nourished, NAD HEENT:  Atraumatic, normocephalic NECK:  Supple. Trachea midline ABDOMEN:  Soft, non-tender, no masses EXTREMITIES:  Moves all extremities well, without clubbing, cyanosis, or edema NEUROLOGIC:  Alert and oriented x 3, normal gait, CN II-XII grossly intact MENTAL STATUS:  appropriate BACK:  Non-tender to palpation, No CVAT SKIN:  Warm, dry, and intact   Results: Laboratory Data: Lab Results  Component Value Date   WBC 6.3 11/18/2021   HGB 12.8 11/18/2021   HCT 37.6 11/18/2021   MCV 86 11/18/2021   PLT 234 11/18/2021    Lab Results  Component Value Date   CREATININE 0.84 11/18/2021    No results found for: PSA  No results found for: TESTOSTERONE  Lab Results  Component Value Date   HGBA1C 5.8 (H) 11/18/2021    Urinalysis    Component Value Date/Time   APPEARANCEUR Clear 12/03/2021 0951   GLUCOSEU Negative 12/03/2021 0951   BILIRUBINUR Negative 12/03/2021 0951   KETONESUR negative 11/18/2021 0848   PROTEINUR Negative 12/03/2021 0951   UROBILINOGEN 0.2 11/18/2021 0848   NITRITE Negative 12/03/2021 0951   LEUKOCYTESUR Negative 12/03/2021 0951    Lab Results  Component Value Date   LABMICR Comment 12/03/2021   WBCUA 0-5 11/25/2021   LABEPIT 0-10 11/25/2021   BACTERIA Few (A) 11/25/2021    Pertinent Imaging:  No results found for this or any previous visit.  No results found for this or any previous visit.  No results found for this or any previous visit.  No results found for this or any previous visit.  No results found for this or any previous visit.  No results found for this or any previous visit.  No results found for this  or any previous visit.  Results for orders placed in visit on 12/03/21  CT RENAL STONE STUDY  Narrative CLINICAL DATA:  Flank pain, kidney stone suspected. Lower abdominal pain and bladder pain for 2 months. History of kidney stones.  EXAM: CT ABDOMEN AND PELVIS WITHOUT CONTRAST  TECHNIQUE: Multidetector CT imaging of the abdomen and pelvis was performed following the standard protocol without IV contrast.  RADIATION DOSE REDUCTION: This exam was performed according to the departmental dose-optimization program which includes automated exposure control, adjustment of the mA and/or kV according to patient size and/or use of iterative reconstruction technique.  COMPARISON:  Abdominal ultrasound 07/27/2018. Report from abdominopelvic CT 02/13/2020.  FINDINGS: Lower chest: Clear lung bases. No significant pleural or pericardial effusion.  Hepatobiliary: The liver is normal in density without suspicious focal abnormality. No evidence of gallstones, gallbladder wall thickening or biliary dilatation.  Pancreas: Unremarkable. No pancreatic  ductal dilatation or surrounding inflammatory changes.  Spleen: Normal in size without focal abnormality.  Adrenals/Urinary Tract: Both adrenal glands appear normal. Both kidneys appear unremarkable as imaged in the noncontrast state. No evidence of urinary tract calculus, hydronephrosis or perinephric soft tissue stranding. The bladder is nearly empty and suboptimally evaluated. A small amount of air is present in the bladder lumen, likely iatrogenic. No apparent bladder wall thickening or surrounding inflammation.  Stomach/Bowel: A small amount of enteric contrast was administered. The stomach appears unremarkable for its degree of distension. No evidence of bowel wall thickening, distention or surrounding inflammatory change. The appendix appears normal.  Vascular/Lymphatic: There are no enlarged abdominal or pelvic lymph nodes. Mild  aortic and branch vessel atherosclerosis.  Reproductive: The uterus and ovaries appear unremarkable. No adnexal mass.  Other: No evidence of abdominal wall hernia or ascites. Possible pelvic floor laxity.  Musculoskeletal: No acute or significant osseous findings.  IMPRESSION: 1. No evidence of urinary tract calculus or hydronephrosis. 2. Small amount of air in the bladder, likely iatrogenic. The bladder otherwise appears unremarkable for its degree of distention. 3. Possible pelvic floor laxity. 4.  Aortic Atherosclerosis (ICD10-I70.0).   Electronically Signed By: Carey Bullocks M.D. On: 12/03/2021 10:37  Results for orders placed or performed in visit on 12/27/21 (from the past 24 hour(s))  BLADDER SCAN AMB NON-IMAGING   Collection Time: 12/27/21  3:40 PM  Result Value Ref Range   Scan Result 0

## 2021-12-27 NOTE — Progress Notes (Signed)
post void residual =0mL 

## 2021-12-29 ENCOUNTER — Encounter: Payer: Self-pay | Admitting: Urology

## 2021-12-29 LAB — URINE CULTURE

## 2021-12-31 ENCOUNTER — Other Ambulatory Visit: Payer: Self-pay | Admitting: Nurse Practitioner

## 2021-12-31 ENCOUNTER — Other Ambulatory Visit: Payer: Self-pay | Admitting: Family Medicine

## 2021-12-31 DIAGNOSIS — E119 Type 2 diabetes mellitus without complications: Secondary | ICD-10-CM

## 2021-12-31 DIAGNOSIS — F32A Depression, unspecified: Secondary | ICD-10-CM

## 2022-01-03 DIAGNOSIS — H2513 Age-related nuclear cataract, bilateral: Secondary | ICD-10-CM | POA: Diagnosis not present

## 2022-01-03 DIAGNOSIS — H16223 Keratoconjunctivitis sicca, not specified as Sjogren's, bilateral: Secondary | ICD-10-CM | POA: Diagnosis not present

## 2022-01-03 DIAGNOSIS — E119 Type 2 diabetes mellitus without complications: Secondary | ICD-10-CM | POA: Diagnosis not present

## 2022-01-03 DIAGNOSIS — Z7984 Long term (current) use of oral hypoglycemic drugs: Secondary | ICD-10-CM | POA: Diagnosis not present

## 2022-01-06 ENCOUNTER — Ambulatory Visit: Payer: Self-pay | Admitting: Gastroenterology

## 2022-01-07 ENCOUNTER — Encounter (HOSPITAL_COMMUNITY): Payer: Self-pay | Admitting: Physical Therapy

## 2022-01-07 ENCOUNTER — Ambulatory Visit (HOSPITAL_COMMUNITY): Payer: Medicare Other | Attending: Physician Assistant | Admitting: Physical Therapy

## 2022-01-07 ENCOUNTER — Other Ambulatory Visit: Payer: Self-pay

## 2022-01-07 DIAGNOSIS — M6289 Other specified disorders of muscle: Secondary | ICD-10-CM | POA: Insufficient documentation

## 2022-01-07 DIAGNOSIS — N3946 Mixed incontinence: Secondary | ICD-10-CM | POA: Diagnosis present

## 2022-01-07 NOTE — Therapy (Signed)
?OUTPATIENT PHYSICAL THERAPY FEMALE PELVIC EVALUATION ? ? ?Patient Name: Catherine Moore ?MRN: [id number] ?DOB:26-May-1956, 66 y.o., female ?Today's Date: 01/07/2022 ? ? PT End of Session - 01/07/22 1615   ? ? Visit Number 1   ? Number of Visits 4   ? Date for PT Re-Evaluation 02/06/22   ? Authorization Type [employer]   ? Progress Note Due on Visit 4   ? PT Start Time 1616   ? PT Stop Time 8250   ? PT Time Calculation (min) 29 min   ? Activity Tolerance Patient tolerated treatment well   ? Behavior During Therapy [facility] for tasks assessed/performed   ? ?  ?  ? ?  ? ? ?Past Medical History:  ?Diagnosis Date  ? Anxiety   ? Arthritis   ? Cirrhosis ([place])   ? Complication of anesthesia   ? Depression   ? Diabetes ([place])   ? GERD (gastroesophageal reflux disease)   ? Hyperlipidemia   ? PONV (postoperative nausea and vomiting)   ? ?Past Surgical History:  ?Procedure Laterality Date  ? BIOPSY  08/16/2016  ? Procedure: BIOPSY;  Surgeon: Daneil Dolin, MD;  [facility];  Service: Endoscopy;;  ? BIOPSY  08/16/2018  ? Procedure: BIOPSY;  Surgeon: Daneil Dolin, MD;  [facility];  Service: Endoscopy;;  gastric  ? COLONOSCOPY  08/09/2010  ? [id number] rectum colon  ? COLONOSCOPY WITH PROPOFOL N/A 04/07/2020  ? Procedure: COLONOSCOPY WITH PROPOFOL;  Surgeon: Daneil Dolin, MD;  [facility];  Service: Endoscopy;  Laterality: N/A;  7:30am  ? ENDOMETRIAL ABLATION    ? ESOPHAGOGASTRODUODENOSCOPY N/A 08/16/2016  ? Dr. Gala Romney: normal esophagus, GAVE, multiple gastric polyps s/p biopsy (hyperplastic), normal duodenum   ? ESOPHAGOGASTRODUODENOSCOPY (EGD) WITH PROPOFOL N/A 08/16/2018  ? Rourk: Multiple gastric polyps, pathology revealed fundic gland, gastric biopsies negative for H. pylori.  ? IR GENERIC HISTORICAL  06/13/2016  ? IR TRANSCATHETER BX 06/13/2016 [facility]  ? IR GENERIC HISTORICAL  06/13/2016  ? IR US GUIDE VASC ACCESS RIGHT 06/13/2016 [facility]  ? IR GENERIC HISTORICAL  06/13/2016  ? IR  VENOGRAM HEPATIC W HEMODYNAMIC EVALUATION 06/13/2016 [facility]  ? POLYPECTOMY  04/07/2020  ? Procedure: POLYPECTOMY;  Surgeon: Daneil Dolin, MD;  [facility];  Service: Endoscopy;;  ? ?Patient Active Problem List  ? Diagnosis Date Noted  ? OAB (overactive bladder) 11/26/2021  ? Urinary frequency 11/18/2021  ? Flushing 11/18/2021  ? Right ear pain 11/18/2021  ? NASH (nonalcoholic steatohepatitis) 03/01/2017  ? Liver cirrhosis secondary to NASH (Catherine Moore) 08/03/2016  ? Type 2 diabetes mellitus without complication, without long-term current use of insulin (Catherine Moore) 10/16/2015  ? GERD (gastroesophageal reflux disease) 09/27/2013  ? Pain management 09/27/2013  ? Anxiety and depression 09/27/2013  ? Hyperlipidemia 02/15/2013  ? Mixed incontinence urge and stress 02/15/2013  ? ? ?PCP: Coral Spikes, DO ? ?REFERRING PROVIDER: [place], [place]* ? ?REFERRING DIAG: Mixed stress and urge urinary incontinence ? ?THERAPY DIAG:  ?Mixed stress and urge urinary incontinence ? ?ONSET DATE: 11/18/2021 ? ?SUBJECTIVE:                                                                                                                                                                                          ? ?  SUBJECTIVE STATEMENT: ?Catherine Moore is a 66 y.o. female who presents for continued evaluation of pelvic floor  burning and incontinence.  She has had incontinence since December of this year.    The patient states that she will feel pressure she goes to the bathroom and it feels  like an electrical sensation is going through her.  She states that she wears a diaper when she works because sometimes she can not make it to the restroom.  She will wear a pad when she goes out in the evening just in case.  She works standing up and that is the only time that she has her problem.  In the evening when she is home she goes normal as well as at night.  ?  ? ? ?PAIN:  ?Are you having pain? No ?                       Sometimes she  has burning with urination even though she does not have an infection. She only has pain when she urinates at work.  On a scale to 0-10 she would state that her pain is at a 5-6 when she has it.  ?PRECAUTIONS: None ? ?WEIGHT BEARING RESTRICTIONS No ? ?FALLS:  ?Has patient fallen in last 6 months? No, Number of falls: 0 ? ?LIVING ENVIRONMENT: ?Lives with: lives with their family and lives alone ?OCCUPATION:  ? ?PLOF: Independent ? ?PATIENT GOALS less accidents, not to have to go to the bathroom as frequently  ? ?PERTINENT HISTORY:  ?Cystoscopic exam 1 month ago unremarkable and recent CT stone study indicated no evidence of urinary tract calculus, hydronephrosis, obstruction.  Possible pelvic floor laxity noted by radiology.  ?Sexual abuse: No ? ?BOWEL MOVEMENT ?Pain with bowel movement: No ?Leakage: No ? ? ?URINATION ?Pain with urination: Yes ?Fully empty bladder: Yes:   ?Stream: Weak ?Urgency: Yes:   ?Frequency: every 15 minutes at times  ?Leakage: Urge to void ?Pads: Yes: 1 per day  ? ?PREGNANCY ?Vaginal deliveries 2 ?Tearing No ? ? ? ? ?OBJECTIVE:  ? ?DIAGNOSTIC FINDINGS:  ?Weak abdominal and pelvic floor mm  ? ? L ?POSTURE:  ?Protruding abdominal wall mm ? ?       ?TODAY'S TREATMENT  ?Tall posture x 5 ?Abdominal set x 10 ?Kegal:  slow twitch mm:  15" contract 30" relax x 10  ?            Fast twich mm:  contract 2" rest 4" x 10  ? ? ?PATIENT EDUCATION:  ?Education details: importance of abdominal wall strength, resting between kegal contractions  ?Person educated: Patient ?Education method: Explanation ?Education comprehension: verbalized understanding ? ? ?HOME EXERCISE PROGRAM: ?Ab set, slow and fast twitch kegals  ? ?ASSESSMENT: ? ?CLINICAL IMPRESSION: ?Patient is a 66 y.o. f who was seen today for physical therapy evaluation and treatment for pelvic floor dysfunction.   Catherine Moore has been having difficulty with urinary incontinence and has pain with urination but does not have a UTI.   Examination  demonstrates posture dysfunction, weak abdominal and pelvic floor mm.  Ms. Gero will benefit from skilled PT to assist in educating pt in exercises to improve her mm strength to decrease her episodes of incontinence.  ? ? ?OBJECTIVE IMPAIRMENTS decreased strength, postural dysfunction. ? ?ACTIVITY LIMITATIONS  none .  ? ?PERSONAL FACTORS Age, Fitness, and Time since onset of injury/illness/exacerbation are also affecting patient's functional outcome.  ? ? ?REHAB POTENTIAL: Good ? ?  CLINICAL DECISION MAKING: Stable/uncomplicated ? ?EVALUATION COMPLEXITY: Low ? ? ?GOALS: ?Goals reviewed with patient? Yes ? ?SHORT TERM GOALS: Target date: 01/21/2022 ? ?PT pain with urination will decrease to no greater than a 4/10  ?Baseline: 7-9 ?Goal status: INITIAL ? ?2.  PT to note that she is having 20-25% less accidents at work ?Baseline: varies  ?Goal status: INITIAL ? ?LONG TERM GOALS: Target date: 02/04/2022 ? ?Pt to be I in advanced HEP in order to note that pain level with  ?                  Urination is no greater than a 2/10  ?Baseline: 4 ?Goal status: INITIAL ? ?2.  Pt frequency of incontinence to be decreased by at least 50% ?Baseline: varies  ?Goal status: INITIAL ? ? ? ?PLAN: ?PT FREQUENCY: 1x/week ? ?PT DURATION: 4 weeks ? ?PLANNED INTERVENTIONS: Therapeutic exercises, Neuromuscular re-education, Patient/Family education, and Manual therapy ? ?PLAN FOR NEXT SESSION: increase long hold kegal, begin heel slides, hip abduction and all four hip extension with abdominal set  ? ?Rayetta Humphrey, PT CLT ?[phone]  ?01/07/2022, 4:16 PM  ?

## 2022-01-13 ENCOUNTER — Encounter (HOSPITAL_COMMUNITY): Payer: Medicare Other | Admitting: Physical Therapy

## 2022-01-14 ENCOUNTER — Encounter (HOSPITAL_COMMUNITY): Payer: Self-pay | Admitting: Physical Therapy

## 2022-01-14 ENCOUNTER — Ambulatory Visit (HOSPITAL_COMMUNITY): Payer: Medicare Other | Admitting: Physical Therapy

## 2022-01-14 DIAGNOSIS — N3946 Mixed incontinence: Secondary | ICD-10-CM

## 2022-01-14 DIAGNOSIS — M6289 Other specified disorders of muscle: Secondary | ICD-10-CM | POA: Diagnosis not present

## 2022-01-14 NOTE — Therapy (Signed)
?OUTPATIENT PHYSICAL THERAPY TREATMENT NOTE ? ? ?Patient Name: Catherine Moore ?MRN: [id number] ?DOB:1955/12/27, 66 y.o., female ?Today's Date: 01/14/2022 ? ?PCP: Coral Spikes, DO ?REFERRING PROVIDER: Coral Spikes, DO ? ? PT End of Session - 01/14/22 1034   ? ? Visit Number 2   ? Number of Visits 4   ? Date for PT Re-Evaluation 02/06/22   ? Authorization Type [employer]   ? Progress Note Due on Visit 4   ? PT Start Time 1040   ? PT Stop Time 1118   ? PT Time Calculation (min) 38 min   ? Activity Tolerance Patient tolerated treatment well   ? Behavior During Therapy [facility] for tasks assessed/performed   ? ?  ?  ? ?  ? ? ?Past Medical History:  ?Diagnosis Date  ? Anxiety   ? Arthritis   ? Cirrhosis ([place])   ? Complication of anesthesia   ? Depression   ? Diabetes ([place])   ? GERD (gastroesophageal reflux disease)   ? Hyperlipidemia   ? PONV (postoperative nausea and vomiting)   ? ?Past Surgical History:  ?Procedure Laterality Date  ? BIOPSY  08/16/2016  ? Procedure: BIOPSY;  Surgeon: Daneil Dolin, MD;  [facility];  Service: Endoscopy;;  ? BIOPSY  08/16/2018  ? Procedure: BIOPSY;  Surgeon: Daneil Dolin, MD;  [facility];  Service: Endoscopy;;  gastric  ? COLONOSCOPY  08/09/2010  ? [id number] rectum colon  ? COLONOSCOPY WITH PROPOFOL N/A 04/07/2020  ? Procedure: COLONOSCOPY WITH PROPOFOL;  Surgeon: Daneil Dolin, MD;  [facility];  Service: Endoscopy;  Laterality: N/A;  7:30am  ? ENDOMETRIAL ABLATION    ? ESOPHAGOGASTRODUODENOSCOPY N/A 08/16/2016  ? Dr. Gala Romney: normal esophagus, GAVE, multiple gastric polyps s/p biopsy (hyperplastic), normal duodenum   ? ESOPHAGOGASTRODUODENOSCOPY (EGD) WITH PROPOFOL N/A 08/16/2018  ? Rourk: Multiple gastric polyps, pathology revealed fundic gland, gastric biopsies negative for H. pylori.  ? IR GENERIC HISTORICAL  06/13/2016  ? IR TRANSCATHETER BX 06/13/2016 [facility]  ? IR GENERIC HISTORICAL  06/13/2016  ? IR US GUIDE VASC ACCESS RIGHT 06/13/2016  [facility]  ? IR GENERIC HISTORICAL  06/13/2016  ? IR VENOGRAM HEPATIC W HEMODYNAMIC EVALUATION 06/13/2016 [facility]  ? POLYPECTOMY  04/07/2020  ? Procedure: POLYPECTOMY;  Surgeon: Daneil Dolin, MD;  [facility];  Service: Endoscopy;;  ?Referring MD: ? [place], Susy [place]* ?  ? ? ?REFERRING DIAG: Mixed stress and urge urinary incontinence ? ?THERAPY DIAG:  ?Mixed stress and urge urinary incontinence ? ?PERTINENT HISTORY: back pain, OA, depressin ? ?PRECAUTIONS: none  ? ?SUBJECTIVE: Pt states that some days she has only been able to complete the exercise 3x a day; she is noticing some improvement  ? ?PAIN:  ?Are you having pain? No ?                         01/14/2022 ?                                   Supine: ?                                   Long kegals: 30" hold 60" relax x 8 ?  Short kegals    2" hold 4" relax  ?                                   Heel slides:   8 x B heel off table ?                                   Side lying:    ?                                   Hip abduction x 10 ?                                   Prone:   ?                                   Hip extension x 5 ( with pillow under abdominal, unable to complete all four at this time.) ? ?TODAY'S TREATMENT : EVAL ?Tall posture x 5 ?Abdominal set x 10 ?Kegal:  slow twitch mm:  15" contract 30" relax x 10  ?            Fast twich mm:  contract 2" rest 4" x 10  ?  ?  ?PATIENT EDUCATION:  ?Education details: importance of abdominal wall strength, resting between kegal contractions  ?Person educated: Patient ?Education method: Explanation ?Education comprehension: verbalized understanding ?  ?  ?HOME EXERCISE PROGRAM: ?Ab set, slow and fast twitch kegals  ?  ?ASSESSMENT: ?  ?CLINICAL IMPRESSION:   Pt noting no pain and decreased incontinence already.  Pt progressed to higher abdominal strengthening exercises with good technique noted. ?  ?OBJECTIVE IMPAIRMENTS decreased strength, postural  dysfunction. ?  ?ACTIVITY LIMITATIONS  none .  ?  ?PERSONAL FACTORS Age, Fitness, and Time since onset of injury/illness/exacerbation are also affecting patient's functional outcome.  ?  ?  ?REHAB POTENTIAL: Good ?  ?CLINICAL DECISION MAKING: Stable/uncomplicated ?  ?EVALUATION COMPLEXITY: Low ?  ?  ?GOALS: ?Goals reviewed with patient? Yes ?  ?SHORT TERM GOALS: Target date: 01/21/2022 ?  ?PT pain with urination will decrease to no greater than a 4/10  ?Baseline: 7-9 ?Goal status: on-going  ?  ?2.  PT to note that she is having 20-25% less accidents at work ?Baseline: varies  ?Goal status: on-going ?  ?LONG TERM GOALS: Target date: 02/04/2022 ?  ?Pt to be I in advanced HEP in order to note that pain level with  ?                  Urination is no greater than a 2/10  ?Baseline: 4 ?Goal status: on-going  ?2.  Pt frequency of incontinence to be decreased by at least 50% ?Baseline: varies  ?Goal status: on-going  ?  ?  ?  ?PLAN: ?PT FREQUENCY: 1x/week ?  ?PT DURATION: 4 weeks ?  ?PLANNED INTERVENTIONS: Therapeutic exercises, Neuromuscular re-education, Patient/Family education, and Manual therapy ?  ?PLAN FOR NEXT SESSION: increase long hold kegal, begin all four hip extension with abdominal set and abdominal lift  ?  ? ? ? ?Rayetta Humphrey, PT CLT ?[phone]  ?  01/14/2022, 11:30 AM ? ?   ?

## 2022-01-19 DIAGNOSIS — M130 Polyarthritis, unspecified: Secondary | ICD-10-CM | POA: Diagnosis not present

## 2022-01-19 DIAGNOSIS — M542 Cervicalgia: Secondary | ICD-10-CM | POA: Diagnosis not present

## 2022-01-19 DIAGNOSIS — Z79891 Long term (current) use of opiate analgesic: Secondary | ICD-10-CM | POA: Diagnosis not present

## 2022-01-19 DIAGNOSIS — G4700 Insomnia, unspecified: Secondary | ICD-10-CM | POA: Diagnosis not present

## 2022-01-20 ENCOUNTER — Ambulatory Visit (HOSPITAL_COMMUNITY): Payer: Medicare Other | Attending: Physician Assistant | Admitting: Physical Therapy

## 2022-01-20 DIAGNOSIS — N3946 Mixed incontinence: Secondary | ICD-10-CM | POA: Insufficient documentation

## 2022-01-20 NOTE — Therapy (Signed)
?OUTPATIENT PHYSICAL THERAPY TREATMENT NOTE ? ? ?Patient Name: Catherine Moore ?MRN: [id number] ?DOB:[birth year], 66 y.o., female ?Today's Date: 01/20/2022 ? ?PCP: Coral Spikes, DO ?REFERRING PROVIDER: Coral Spikes, DO ?PHYSICAL THERAPY DISCHARGE SUMMARY ? ?Visits from Start of Care: 3 ? ?Current functional level related to goals / functional outcomes: ?met ?  ?Remaining deficits: ?none ?  ?Education / Equipment: ?HEP  ? ?Patient agrees to discharge. Patient goals were met. Patient is being discharged due to meeting the stated rehab goals.  ? ? PT End of Session - 01/20/22 1407   ? ? Visit Number 3   ? Number of Visits 3  ? Date for PT Re-Evaluation 02/06/22   ? Authorization Type [employer]   ? Progress Note Due on Visit 4   ? PT Start Time 1407   ? PT Stop Time 0102   ? PT Time Calculation (min) 38 min   ? Activity Tolerance Patient tolerated treatment well   ? Behavior During Therapy [facility] for tasks assessed/performed   ? ?  ?  ? ?  ? ? ?Past Medical History:  ?Diagnosis Date  ? Anxiety   ? Arthritis   ? Cirrhosis ([place])   ? Complication of anesthesia   ? Depression   ? Diabetes ([place])   ? GERD (gastroesophageal reflux disease)   ? Hyperlipidemia   ? PONV (postoperative nausea and vomiting)   ? ?Past Surgical History:  ?Procedure Laterality Date  ? BIOPSY  08/16/2016  ? Procedure: BIOPSY;  Surgeon: Daneil Dolin, MD;  [facility];  Service: Endoscopy;;  ? BIOPSY  08/16/2018  ? Procedure: BIOPSY;  Surgeon: Daneil Dolin, MD;  [facility];  Service: Endoscopy;;  gastric  ? COLONOSCOPY  08/09/2010  ? VOZ:DGUYQI rectum colon  ? COLONOSCOPY WITH PROPOFOL N/A 04/07/2020  ? Procedure: COLONOSCOPY WITH PROPOFOL;  Surgeon: Daneil Dolin, MD;  [facility];  Service: Endoscopy;  Laterality: N/A;  7:30am  ? ENDOMETRIAL ABLATION    ? ESOPHAGOGASTRODUODENOSCOPY N/A 08/16/2016  ? Dr. Gala Romney: normal esophagus, GAVE, multiple gastric polyps s/p biopsy (hyperplastic), normal duodenum   ?  ESOPHAGOGASTRODUODENOSCOPY (EGD) WITH PROPOFOL N/A 08/16/2018  ? Rourk: Multiple gastric polyps, pathology revealed fundic gland, gastric biopsies negative for H. pylori.  ? IR GENERIC HISTORICAL  06/13/2016  ? IR TRANSCATHETER BX 06/13/2016 [facility]  ? IR GENERIC HISTORICAL  06/13/2016  ? IR US GUIDE VASC ACCESS RIGHT 06/13/2016 [facility]  ? IR GENERIC HISTORICAL  06/13/2016  ? IR VENOGRAM HEPATIC W HEMODYNAMIC EVALUATION 06/13/2016 [facility]  ? POLYPECTOMY  04/07/2020  ? Procedure: POLYPECTOMY;  Surgeon: Daneil Dolin, MD;  [facility];  Service: Endoscopy;;  ?Referring MD: ? [place], [place]* ?  ? ? ?REFERRING DIAG: Mixed stress and urge urinary incontinence ? ?THERAPY DIAG:  ?Mixed stress and urge urinary incontinence ? ?PERTINENT HISTORY: back pain, OA, depressin ? ?PRECAUTIONS: none  ? ?SUBJECTIVE: Pt states that she is able to make it to the bathroom now.  She can tell that she is getting better.  She is doing her exercises and ?                         Has no questions ?PAIN:  ?Are you having pain? No ?                01/20/2022 supine:  curls x 15 ?  All fours :  attempted opposite arm/leg ended up doing single leg raise x 10 B ?                               Standing:  Theraband. Arm flexion B x 10; golf swing x 10 B, band at the top of the door pull down to opposite ?                                                 Hip  B x 10 Each; shoulder extension x 10 B  ?                         01/14/2022 ?                                   Supine: ?                                   Long kegals: 30" hold 60" relax x 8 ?                                   Short kegals    2" hold 4" relax  ?                                   Heel slides:   8 x B heel off table ?                                   Side lying:    ?                                   Hip abduction x 10 ?                                   Prone:   ?                                   Hip extension x 5 (  with pillow under abdominal, unable to complete all four at this time.) ? ?TODAY'S TREATMENT : EVAL ?Tall posture x 5 ?Abdominal set x 10 ?Kegal:  slow twitch mm:  15" contract 30" relax x 10  ?            Fast twich mm:  contract 2" rest 4" x 10  ?  ?  ?PATIENT EDUCATION:  ?Education details: importance of abdominal wall strength, resting between kegal contractions  ?Person educated: Patient ?Education method: Explanation ?Education comprehension: verbalized understanding ?  ?  ?HOME EXERCISE PROGRAM: ?Ab set, slow and fast twitch kegals  ?  ?ASSESSMENT: ?  ?CLINICAL IMPRESSION:   All  goals met pt is I in her exercise program.  Will discharge. ?OBJECTIVE IMPAIRMENTS decreased strength, postural dysfunction. ?  ?ACTIVITY LIMITATIONS  none .  ?  ?PERSONAL FACTORS Age, Fitness, and Time since onset of injury/illness/exacerbation are also affecting patient's functional outcome.  ?  ?  ?REHAB POTENTIAL: Good ?  ?CLINICAL DECISION MAKING: Stable/uncomplicated ?  ?EVALUATION COMPLEXITY: Low ?  ?  ?GOALS: ?Goals reviewed with patient? Yes ?  ?SHORT TERM GOALS: Target date: 01/21/2022 ?  ?PT pain with urination will decrease to no greater than a 4/10  ?Baseline: 7-9 ?Goal status: MET ?  ?2.  PT to note that she is having 20-25% less accidents at work ?Baseline: varies  ?Goal status: met  ?  ?LONG TERM GOALS: Target date: 02/04/2022 ?  ?Pt to be I in advanced HEP in order to note that pain level with  ?                  Urination is no greater than a 2/10  ?Baseline: 4 ?Goal status: MET  ?2.  Pt frequency of incontinence to be decreased by at least 50% ?Baseline: varies  ?Goal status: MET  ?  ?  ?  ?PLAN: ?PT FREQUENCY: 1x/week ?  ?PT DURATION: 4 weeks ?  ?PLANNED INTERVENTIONS: Therapeutic exercises, Neuromuscular re-education, Patient/Family education, and Manual therapy ?  ?PLAN FOR NEXT SESSION: discharge. ? ? ?Rayetta Humphrey, PT CLT ?[phone]  ?01/20/2022, 11:30 AM ? ?   ?

## 2022-01-26 ENCOUNTER — Encounter (HOSPITAL_COMMUNITY): Payer: Medicare Other | Admitting: Physical Therapy

## 2022-02-01 ENCOUNTER — Encounter: Payer: Self-pay | Admitting: Nurse Practitioner

## 2022-02-01 ENCOUNTER — Ambulatory Visit (INDEPENDENT_AMBULATORY_CARE_PROVIDER_SITE_OTHER): Payer: Medicare Other | Admitting: Nurse Practitioner

## 2022-02-01 VITALS — BP 137/75 | HR 79 | Ht 66.0 in | Wt 192.2 lb

## 2022-02-01 DIAGNOSIS — E119 Type 2 diabetes mellitus without complications: Secondary | ICD-10-CM

## 2022-02-01 DIAGNOSIS — E669 Obesity, unspecified: Secondary | ICD-10-CM | POA: Diagnosis not present

## 2022-02-01 MED ORDER — TIRZEPATIDE 2.5 MG/0.5ML ~~LOC~~ SOAJ: 2.5000 mg | SUBCUTANEOUS | 0 refills | Status: DC

## 2022-02-01 MED ORDER — FREESTYLE LIBRE 2 SENSOR MISC: 11 refills | Status: DC

## 2022-02-01 NOTE — Progress Notes (Signed)
? ?Subjective:  ? ? Patient ID: Catherine Moore, female    DOB: [birth year], 66 y.o.   MRN: [id number] ? ?HPI ? ?Patient arrives to discuss weight loss injectable. Patient is interested in trying injectable to help with diabetes and weight loss. Patient currently on Metformin and Glipizide for diabetes and last HgbA1c 5.8.  Patient denies any hypoglycemic events. ? ?Patient preferring Mounjaro instead of Ozempic at this time due to [facility] better effect on weight loss. ? ? ?  ?   ? ?Review of Systems  ?All other systems reviewed and are negative. ? ?   ?Objective:  ? Physical Exam ?Vitals reviewed.  ?Constitutional:   ?   General: She is not in acute distress. ?   Appearance: Normal appearance. She is obese. She is not ill-appearing, toxic-appearing or diaphoretic.  ?Cardiovascular:  ?   Rate and Rhythm: Normal rate and regular rhythm.  ?   Pulses: Normal pulses.  ?   Heart sounds: Normal heart sounds.  ?Pulmonary:  ?   Effort: Pulmonary effort is normal.  ?   Breath sounds: Normal breath sounds.  ?Musculoskeletal:  ?   Comments: Grossly intact  ?Skin: ?   General: Skin is warm.  ?   Capillary Refill: Capillary refill takes less than 2 seconds.  ?Neurological:  ?   Mental Status: She is alert.  ?   Comments: Grossly intact  ?Psychiatric:     ?   Mood and Affect: Mood normal.     ?   Behavior: Behavior normal.  ? ? ? ? ? ?   ?Assessment & Plan:  ? ?1. Obesity (BMI 30-39.9) ?-We will attempt to order [facility].  However if unable to obtain [facility] will start Ozempic instead.  Ozempic has added benefit of diabetic control without risk of hypoglycemic events. ?- tirzepatide [facility]) 2.5 MG/0.5ML Pen; Inject 2.5 mg into the skin once a week.  Dispense: 2 mL; Refill: 0 ?-Patient denies any history of pancreatitis, thyroid disease, or gallbladder disease ? ?Discussion of GLP-1 medications ?Please remember these medications are to assist with weight reduction and diabetes control.  They do not take the place of healthy  eating and regular physical activity. ?There are several GLP-1 medications that can help with weight reduction and diabetes control. ? ?GLP-1 medications with indication for diabetes-Trulicity, Victoza, Bydureon , Mounjaro, Ozempic, and Rybelsus (although these are injected medicines except for Rybelsus which is a pill) ?GLP-1 medications with indications for weight loss-Wegovy, and Saxenda ? ?Mechanism of action ? ?These medications stimulate glucagon peptide receptors.  By doing so it does the following: ? 1-slows down stomach emptying-essentially food takes longer to go through the stomach and the intestines-this can lessen appetite ? 2-reduces glucagon secretion from the pancreas-this helps keep blood glucose levels stable between meals ? 3-increase his insulin release from the pancreas-with diabetics this helps keep blood glucose levels stable ? 4-promotes the feeling of being full in the brain-encouraged him receptors in the brain received the signal so the brain and body know that it is time to stop eating ?Benefits of the medication ? 1-reduced weight-reduction of weight is more significant at higher dosing.  Not as much weight reduction with Rybelsus ?  A- typically Wegovy at higher doses can assist with significant weight reduction. ? 2-improved blood glucose levels-with diabetics typically we see a significant drop with A1c when the medication is adjusted appropriately. ? 3-decrease risk of heart attacks and strokes-individuals with type 2 diabetes are at increased risk of  heart attacks and strokes.  These GLP-1 medications can reduce the risk by 22%. ? ?Risk of GLP-1 medications-these are some of the risks.  It is important to talk with your provider in a shared discussion before starting any medication. ? ?Contraindications to GLP-1 medications-in other words who should not take these. ? 1-individuals with history of thyroid medullary cancer ? 2-individuals with family history of multiple endocrine  neoplasm syndrome type II (MEN 2) ? 3-individuals with a history of pancreatitis ? 4-those with a history of severe hypersensitivity or allergy reactions to GLP-1 medications ? 5-should be avoided in individuals with a history of suicidal attempts or active suicidal ideation. ? ?Cautions include- ?1- risk of thyroid C-cell tumors were seen in rats during clinical testing in humans the relevancy of this information has not been determined ?2-risk of pancreatitis including fatal and nonfatal hemorrhagic or necrotizing pancreatitis ?3-gallbladder disease slightly increased risk of gallstones ?4-hypoglycemia-more common with individuals who are on insulin or sulfonylurea such as glipizide ?5-acute kidney injury or worsening of chronic renal failure often this is triggered by severe vomiting and diarrhea as side effects to GLP-1. ?6-slightly increased risk of diabetic retinopathy complications in patients with type 2 diabetes ?7-heart rate increase-slight increase of base heart rate with GLP-1 ?8-suicidal behavior and ideation has been reported in clinical trials with other weight loss medicines.  If depression occurs with medication or suicidal ideation stop medication immediately notify provider or seek help. ? ?Common side effects include nausea, vomiting, diarrhea, constipation and increased heart rate.  Less common side effects severe abdominal pain-if this occurs notify provider stop medication get tested for pancreatitis. ?Full discussion with your provider should occur before starting these medicines. ? ?2. Type 2 diabetes mellitus without complication, without long-term current use of insulin ([place]) ?-If patient to start a GLP-1, patient is to stop glipizide 24 hours before starting medication to prevent hypoglycemic events. ?-Ozempic as benefit of glycemic control out hypoglycemic events. ?-Patient requesting a libre continuous glucose monitoring machine. ?- Libre CGM ordered. Will await for PA and complete PA  once receiving it ?-If starting on a GLP-1 return to clinic in 4 weeks for evaluation. ? ?  ?Note:  This document was prepared using Dragon voice recognition software and may include unintentional dictation errors. ? ?

## 2022-02-02 ENCOUNTER — Encounter (HOSPITAL_COMMUNITY): Payer: Medicare Other | Admitting: Physical Therapy

## 2022-02-04 ENCOUNTER — Telehealth: Payer: Self-pay | Admitting: Family Medicine

## 2022-02-04 NOTE — Telephone Encounter (Signed)
PA came across for [facility] 2.37m/0.5 ml. Per CoverMyMeds medication is on pt drug list and no PA needed at this time.  ?

## 2022-02-07 ENCOUNTER — Other Ambulatory Visit: Payer: Self-pay

## 2022-02-07 ENCOUNTER — Telehealth: Payer: Self-pay

## 2022-02-07 MED ORDER — MIRABEGRON ER 25 MG PO TB24: 25.0000 mg | ORAL_TABLET | Freq: Two times a day (BID) | ORAL | 11 refills | Status: DC

## 2022-02-07 NOTE — Telephone Encounter (Signed)
Rx sent to pharmacy   

## 2022-02-07 NOTE — Telephone Encounter (Signed)
Patient called advising that she was out of her 50 mg Myrbetriq samples. She advised they were working great and she wanted a prescription called into pharmacy. She advised she was taking 50 mg 1/2 tablet twice daily.  ? ? ? ?Pharmacy: ?[facility], [place] ? ? ? ? ? ?

## 2022-02-07 NOTE — Progress Notes (Signed)
Rx sent to pharmacy   

## 2022-02-07 NOTE — Telephone Encounter (Signed)
Patient called requesting samples of the Myrbetriq since we have not sent in the rx refill.  Informed her the refill was sent today and to call back if she still needed samples or had any issues with the rx.  Patient voiced understanding.  ?

## 2022-02-08 ENCOUNTER — Telehealth: Payer: Self-pay | Admitting: *Deleted

## 2022-02-08 NOTE — Telephone Encounter (Signed)
Insurance denied [employer] continuous glucose monitor system. Patient is currently not on insulin and/or requiring multiple glucose(3-4) checks daily  ? ?Patient notified and verbalized understanding. ?

## 2022-02-20 DIAGNOSIS — Z880 Allergy status to penicillin: Secondary | ICD-10-CM | POA: Diagnosis not present

## 2022-02-20 DIAGNOSIS — R112 Nausea with vomiting, unspecified: Secondary | ICD-10-CM | POA: Diagnosis not present

## 2022-02-20 DIAGNOSIS — E119 Type 2 diabetes mellitus without complications: Secondary | ICD-10-CM | POA: Diagnosis not present

## 2022-02-20 DIAGNOSIS — E785 Hyperlipidemia, unspecified: Secondary | ICD-10-CM | POA: Diagnosis not present

## 2022-02-20 DIAGNOSIS — R1110 Vomiting, unspecified: Secondary | ICD-10-CM | POA: Diagnosis not present

## 2022-02-20 DIAGNOSIS — R748 Abnormal levels of other serum enzymes: Secondary | ICD-10-CM | POA: Diagnosis not present

## 2022-02-20 DIAGNOSIS — K529 Noninfective gastroenteritis and colitis, unspecified: Secondary | ICD-10-CM | POA: Diagnosis not present

## 2022-02-21 ENCOUNTER — Telehealth: Payer: Self-pay

## 2022-02-21 NOTE — Telephone Encounter (Signed)
Patient calls this morning states she has been taking mounjaro x 2 weeks , she went to the ER in [place] for diarrhea and vomiting. She states started getting sick around 5/1. She was given fluids and flagyl at hospital and was diagnosed with colitis  she is currently on 2.5 weekly mounjaro and would like to know what's recommended . Possibly to decrease dosage , would you like to have an appt to discuss further. Please advise ? ?

## 2022-02-21 NOTE — Telephone Encounter (Signed)
Patient has been informed per provider notes and recommendations. Appt scheduled for 02/22/22 per request . ?

## 2022-02-22 ENCOUNTER — Ambulatory Visit (INDEPENDENT_AMBULATORY_CARE_PROVIDER_SITE_OTHER): Payer: Medicare Other | Admitting: Nurse Practitioner

## 2022-02-22 ENCOUNTER — Ambulatory Visit: Payer: Medicare Other | Admitting: Nurse Practitioner

## 2022-02-22 ENCOUNTER — Encounter: Payer: Self-pay | Admitting: Nurse Practitioner

## 2022-02-22 VITALS — BP 120/66 | HR 91 | Temp 96.6°F | Ht 66.0 in | Wt 183.8 lb

## 2022-02-22 DIAGNOSIS — E119 Type 2 diabetes mellitus without complications: Secondary | ICD-10-CM | POA: Diagnosis not present

## 2022-02-22 DIAGNOSIS — K853 Drug induced acute pancreatitis without necrosis or infection: Secondary | ICD-10-CM | POA: Diagnosis not present

## 2022-02-22 DIAGNOSIS — K529 Noninfective gastroenteritis and colitis, unspecified: Secondary | ICD-10-CM

## 2022-02-22 MED ORDER — GLIPIZIDE ER 5 MG PO TB24: 5.0000 mg | ORAL_TABLET | Freq: Every day | ORAL | 5 refills | Status: DC

## 2022-02-22 NOTE — Progress Notes (Addendum)
? ?Subjective:  ? ? Patient ID: Catherine Moore, female    DOB: 02-19-1956, 66 y.o.   MRN: [id number] ? ?HPI ?Patient reports to the clinic following the ED visit for diarrhea, nausea, vomiting x1 week.   ? ?She is unsure of source weather moujaro or food from work- started getting sick the night before the 2nd mounjaro injection.  Patient denies that any of her coworkers were sick from the food at work. ?Taking flagyl 1 bid  and has zofran ?Diagnosed w/ colitis received fluids. ?Has not had a full meal since last week , had crackers today and broth yesterday with pedialyte. ?Wants to discuss a smaller dosage weight medication. ? ?Patient states that she is feeling much better now able to tolerate fluids and eating crackers.  Patient states that she has her strength back and she has not had any stools today. ? ?Review of Systems  ?All other systems reviewed and are negative. ? ?   ?Objective:  ? Physical Exam ?Vitals reviewed.  ?Constitutional:   ?   General: She is not in acute distress. ?   Appearance: Normal appearance. She is normal weight. She is not ill-appearing or toxic-appearing.  ?HENT:  ?   Head: Normocephalic and atraumatic.  ?Cardiovascular:  ?   Rate and Rhythm: Normal rate and regular rhythm.  ?   Pulses: Normal pulses.  ?Pulmonary:  ?   Effort: Pulmonary effort is normal.  ?Abdominal:  ?   General: Abdomen is flat. Bowel sounds are normal. There is no distension.  ?   Palpations: There is no mass.  ?   Tenderness: There is no abdominal tenderness. There is no guarding or rebound.  ?   Hernia: No hernia is present.  ?Musculoskeletal:  ?   Comments: Grossly intact  ?Skin: ?   Capillary Refill: Capillary refill takes less than 2 seconds.  ?Neurological:  ?   Mental Status: She is alert.  ?   Comments: Grossly intact  ?Psychiatric:     ?   Mood and Affect: Mood normal.     ?   Behavior: Behavior normal.  ? ? ? ?   ?Assessment & Plan:  ? ?1. Colitis ?-GI symptoms likely caused by [facility] ?-Consider doing GI  stool cultures however since patient has been taking Flagyl stool cultures will be inconclusive. ?-Due to slight elevation in lipase and GI symptoms, highly recommended patient to stop Mounjaro.  Patient in agreement. ?-Stop Mounjaro ?-Continue to eat and drink as tolerated. ?-Continue to take Zofran as needed for nausea. ?-Return to clinic if symptoms return or worsen. ? ?2. Type 2 diabetes mellitus without complication, without long-term current use of insulin ([place]) ?-Stop Mounjaro ?-Start taking glipizide again ?- glipiZIDE (GLUCOTROL XL) 5 MG 24 hr tablet; Take 1 tablet (5 mg total) by mouth daily with breakfast.  Dispense: 30 tablet; Refill: 5 ?-Return to clinic in approximately 3 months for diabetic appointment with Dr. Michael Boston ? ? 3. Pancreatitis suspected ?-Due to elevation in her lipase and the fact that patient was taking GLP ones I believe it prudent to investigate for pancreatitis. ?-Urgent CT of the abdomen ordered  ?-Patient encouraged to get repeat lipase and CMP with GFR done in 2 to 3 days.  Patient stated understanding ?-Return to clinic if you have any other concerns or if your symptoms return. ?-Follow-up with Dr. Michael Boston in 3 months ? ? ?Note:  This document was prepared using Dragon voice recognition software and may include unintentional dictation errors. ?  Note - This record has been created using [employer].  ?Chart creation errors have been sought, but may not always  ?have been located. Such creation errors do not reflect on  ?the standard of medical care. ? ? ?

## 2022-02-23 NOTE — Addendum Note (Signed)
Addended by: Claire Shown on: 02/23/2022 01:32 PM ? ? Modules accepted: Orders ? ?

## 2022-02-24 ENCOUNTER — Other Ambulatory Visit: Payer: Self-pay | Admitting: Nurse Practitioner

## 2022-02-25 LAB — CMP14+EGFR
ALT: 15 IU/L (ref 0–32)
AST: 23 IU/L (ref 0–40)
Albumin/Globulin Ratio: 2 (ref 1.2–2.2)
Albumin: 4.3 g/dL (ref 3.8–4.8)
Alkaline Phosphatase: 54 IU/L (ref 44–121)
BUN/Creatinine Ratio: 8 — ABNORMAL LOW (ref 12–28)
BUN: 8 mg/dL (ref 8–27)
Bilirubin Total: 0.4 mg/dL (ref 0.0–1.2)
CO2: 27 mmol/L (ref 20–29)
Calcium: 8.9 mg/dL (ref 8.7–10.3)
Chloride: 100 mmol/L (ref 96–106)
Creatinine, Ser: 0.98 mg/dL (ref 0.57–1.00)
Globulin, Total: 2.1 g/dL (ref 1.5–4.5)
Glucose: 153 mg/dL — ABNORMAL HIGH (ref 70–99)
Potassium: 3.3 mmol/L — ABNORMAL LOW (ref 3.5–5.2)
Sodium: 144 mmol/L (ref 134–144)
Total Protein: 6.4 g/dL (ref 6.0–8.5)
eGFR: 64 mL/min/{1.73_m2} (ref >59)

## 2022-02-25 LAB — LIPASE: Lipase: 83 U/L — ABNORMAL HIGH (ref 14–72)

## 2022-02-28 ENCOUNTER — Other Ambulatory Visit: Payer: Self-pay | Admitting: Nurse Practitioner

## 2022-02-28 DIAGNOSIS — K853 Drug induced acute pancreatitis without necrosis or infection: Secondary | ICD-10-CM

## 2022-03-01 ENCOUNTER — Ambulatory Visit: Payer: Medicare Other | Admitting: Nurse Practitioner

## 2022-03-02 ENCOUNTER — Other Ambulatory Visit: Payer: Self-pay | Admitting: Family Medicine

## 2022-03-02 DIAGNOSIS — F32A Depression, unspecified: Secondary | ICD-10-CM

## 2022-03-02 DIAGNOSIS — E119 Type 2 diabetes mellitus without complications: Secondary | ICD-10-CM

## 2022-03-16 ENCOUNTER — Ambulatory Visit (INDEPENDENT_AMBULATORY_CARE_PROVIDER_SITE_OTHER): Payer: Medicare Other | Admitting: Gastroenterology

## 2022-03-16 ENCOUNTER — Encounter: Payer: Self-pay | Admitting: Gastroenterology

## 2022-03-16 ENCOUNTER — Telehealth: Payer: Self-pay | Admitting: *Deleted

## 2022-03-16 VITALS — BP 119/76 | HR 101 | Temp 98.1°F | Ht 66.0 in | Wt 184.1 lb

## 2022-03-16 DIAGNOSIS — R197 Diarrhea, unspecified: Secondary | ICD-10-CM | POA: Insufficient documentation

## 2022-03-16 DIAGNOSIS — R1012 Left upper quadrant pain: Secondary | ICD-10-CM | POA: Insufficient documentation

## 2022-03-16 DIAGNOSIS — K7581 Nonalcoholic steatohepatitis (NASH): Secondary | ICD-10-CM | POA: Diagnosis not present

## 2022-03-16 MED ORDER — SUCRALFATE 1 G PO TABS: 1.0000 g | ORAL_TABLET | Freq: Four times a day (QID) | ORAL | 0 refills | Status: DC

## 2022-03-16 NOTE — Progress Notes (Signed)
      Gastroenterology Office Note     Primary Care Physician:  Cook, Jayce G, DO  Primary Gastroenterologist:   Chief Complaint   Chief Complaint  Patient presents with   Cirrhosis    Follow up on cirrhosis. Concerns about having diarrhea off and on. Was taking monojo but quit about 3 weeks ago due to diarrhea and vomiting.      History of Present Illness   Catherine Moore is a 65 y.o. female presenting today in follow-up with a history of biopsy-proven NASH with wedge pressures noting portal hypertension, early cirrhosis on biopsy. Last EGD in 2019 with multiple gastric polyps, negative H.pylori. Colonoscopy for surveillance due to history of polyps is due in 2026.   She had completed one dose of Mounjaro and due for next when she had acute N/V/D. She then presented to an outside ED with CT revealing mild colitis. Lipase was elevated at 237. LFTs normal. Felt she had drug-induced pancreatitis. Normal pancreas on CT.   Her last dose was about 3 weeks ago. She has stopped this now. Yesterday starting burping and felt uneasy. Up all night with diarrhea. Antibiotics several times over the past few months.   Will have intermittent spells of looser stool. Has BM daily to every other day. Will have spell of diarrhea about once per month historically. No rectal bleeding. Dicyclomine if stomach cramping. Hasn't tried with loose stool.   LUQ pain noted for about 4 months. No relation to eating. Always underlying. Nothing improves.   Past Medical History:  Diagnosis Date   Anxiety    Arthritis    Cirrhosis (HCC)    Complication of anesthesia    Depression    Diabetes (HCC)    GERD (gastroesophageal reflux disease)    Hyperlipidemia    PONV (postoperative nausea and vomiting)     Past Surgical History:  Procedure Laterality Date   BIOPSY  08/16/2016   Procedure: BIOPSY;  Surgeon: Robert M Rourk, MD;  [facility];  Service: Endoscopy;;   BIOPSY  08/16/2018    Procedure: BIOPSY;  Surgeon: Rourk, Robert M, MD;  [facility];  Service: Endoscopy;;  gastric   COLONOSCOPY  08/09/2010   RMR:normal rectum colon   COLONOSCOPY WITH PROPOFOL N/A 04/07/2020   Procedure: COLONOSCOPY WITH PROPOFOL;  Surgeon: Rourk, Robert M, MD;  [facility];  Service: Endoscopy;  Laterality: N/A;  7:30am   ENDOMETRIAL ABLATION     ESOPHAGOGASTRODUODENOSCOPY N/A 08/16/2016   Dr. Rourk: normal esophagus, GAVE, multiple gastric polyps s/p biopsy (hyperplastic), normal duodenum    ESOPHAGOGASTRODUODENOSCOPY (EGD) WITH PROPOFOL N/A 08/16/2018   Rourk: Multiple gastric polyps, pathology revealed fundic gland, gastric biopsies negative for H. pylori.   IR GENERIC HISTORICAL  06/13/2016   IR TRANSCATHETER BX 06/13/2016 [facility]   IR GENERIC HISTORICAL  06/13/2016   IR US GUIDE VASC ACCESS RIGHT 06/13/2016 [facility]   IR GENERIC HISTORICAL  06/13/2016   IR VENOGRAM HEPATIC W HEMODYNAMIC EVALUATION 06/13/2016 [facility]   POLYPECTOMY  04/07/2020   Procedure: POLYPECTOMY;  Surgeon: Rourk, Robert M, MD;  [facility];  Service: Endoscopy;;    Current Outpatient Medications  Medication Sig Dispense Refill   ALPRAZolam (XANAX) 0.5 MG tablet Take 1.5 mg by mouth daily.     blood glucose meter kit and supplies Dispense based on patient and insurance preference. Use up to four times daily as directed. (FOR ICD-10 E10.9, E11.9). 1 each 0     celecoxib (CELEBREX) 100 MG capsule Take 100 mg by mouth 2 (two) times daily.     cetirizine (ZYRTEC) 10 MG tablet TAKE 1 TABLET BY MOUTH EVERY DAY 30 tablet 2   dicyclomine (BENTYL) 10 MG capsule Take 10 mg by mouth 4 (four) times daily as needed for spasms.     DULoxetine (CYMBALTA) 60 MG capsule TAKE 1 CAPSULE BY MOUTH EVERY DAY 30 capsule 1   estradiol (ESTRACE) 0.1 MG/GM vaginal cream Apply a pea size amount to the urethra(anterior vaginal canal) twice weekly. 42.5 g 12   fluticasone (FLONASE) 50 MCG/ACT nasal  spray INSTILL 2 SPRAYS IN EACH NOSTRIL EVERY DAY 16 g 5   gabapentin (NEURONTIN) 300 MG capsule gabapentin 300 mg capsule  TAKE TWO CAPSULES BY MOUTH EVERY DAY     glipiZIDE (GLUCOTROL XL) 5 MG 24 hr tablet Take 1 tablet (5 mg total) by mouth daily with breakfast. 30 tablet 5   HYDROcodone-acetaminophen (NORCO) 10-325 MG tablet hydrocodone 10 mg-acetaminophen 325 mg tablet  TAKE 1 TABLET BY MOUTH THREE TIMES DAILY AS NEEDED     Lactobacillus Rhamnosus, GG, (CULTURELLE PO) Take 1 capsule by mouth daily.      metFORMIN (GLUCOPHAGE) 500 MG tablet TAKE 1 TABLET BY MOUTH TWICE DAILY WITH A MEAL 60 tablet 1   mirabegron ER (MYRBETRIQ) 25 MG TB24 tablet Take 1 tablet (25 mg total) by mouth 2 (two) times daily. 60 tablet 11   omeprazole (PRILOSEC) 20 MG capsule TAKE ONE CAPSULE BY MOUTH TWICE DAILY BEFORE A MEAL 60 capsule 5   rosuvastatin (CRESTOR) 10 MG tablet TAKE 1 TABLET BY MOUTH EVERY DAY 90 tablet 1   vitamin B-12 (CYANOCOBALAMIN) 1000 MCG tablet Take 1,000 mcg by mouth daily.     No current facility-administered medications for this visit.    Allergies as of 03/16/2022 - Review Complete 03/16/2022  Allergen Reaction Noted   Mounjaro [tirzepatide] Diarrhea 02/22/2022   Zocor [simvastatin] Nausea Only 02/01/2013   Penicillins Swelling and Rash 07/19/2010    Family History  Problem Relation Age of Onset   Diabetes Father    Hypertension Father    Heart attack Father 55       MI   Stroke Father    Osteoporosis Mother    Arthritis Sister    Arthritis Brother    Colon cancer Neg Hx    Liver disease Neg Hx     Social History   Socioeconomic History   Marital status: Married    Spouse name: Not on file   Number of children: Not on file   Years of education: Not on file   Highest education level: Not on file  Occupational History   Not on file  Tobacco Use   Smoking status: Former    Packs/day: 1.50    Years: 20.00    Pack years: 30.00    Types: Cigarettes    Start date:  05/23/1971    Quit date: 06/16/1991    Years since quitting: 30.7    Passive exposure: Past   Smokeless tobacco: Never  Vaping Use   Vaping Use: Never used  Substance and Sexual Activity   Alcohol use: No    Alcohol/week: 0.0 standard drinks   Drug use: No   Sexual activity: Yes    Birth control/protection: Post-menopausal, Surgical  Other Topics Concern   Not on file  Social History Narrative   Not on file   Social Determinants of Health   Financial Resource Strain: Not on file  Food Insecurity: Not on file  Transportation Needs: Not on file  Physical Activity: Not on file  Stress: Not on file  Social Connections: Not on file  Intimate Partner Violence: Not on file     Review of Systems   Gen: Denies any fever, chills, fatigue, weight loss, lack of appetite.  CV: Denies chest pain, heart palpitations, peripheral edema, syncope.  Resp: Denies shortness of breath at rest or with exertion. Denies wheezing or cough.  GI: Denies dysphagia or odynophagia. Denies jaundice, hematemesis, fecal incontinence. GU : Denies urinary burning, urinary frequency, urinary hesitancy MS: Denies joint pain, muscle weakness, cramps, or limitation of movement.  Derm: Denies rash, itching, dry skin Psych: Denies depression, anxiety, memory loss, and confusion Heme: Denies bruising, bleeding, and enlarged lymph nodes.   Physical Exam   BP 119/76 (BP Location: Left Arm, Patient Position: Sitting, Cuff Size: Large)   Pulse (!) 101   Temp 98.1 F (36.7 C) (Oral)   Ht 5' 6" (1.676 m)   Wt 184 lb 1.6 oz (83.5 kg)   BMI 29.71 kg/m  General:   Alert and oriented. Pleasant and cooperative. Well-nourished and well-developed.  Head:  Normocephalic and atraumatic. Eyes:  Without icterus Abdomen:  +BS, soft, mildly TTP LUQ and non-distended. No HSM noted. No guarding or rebound. No masses appreciated.  Rectal:  Deferred  Msk:  Symmetrical without gross deformities. Normal posture. Extremities:   Without edema. Neurologic:  Alert and  oriented x4;  grossly normal neurologically. Skin:  Intact without significant lesions or rashes. Psych:  Alert and cooperative. Normal mood and affect.   Assessment   Catherine Moore is a 66 y.o. female presenting today in follow-up with a history of biopsy-proven NASH with wedge pressures noting portal hypertension, possible early cirrhosis on biopsy. Last EGD in 2019 with multiple gastric polyps, negative H.pylori. Colonoscopy for surveillance due to history of polyps is due in 2026.   Now with recent acute N/V/D several weeks ago in setting of Mounjaro and found to have elevated lipase. Likely drug-induced pancreatitis. CT with normal pancreas but did show mild colitis. She has started to have recurrent diarrhea at this time and does endorse exposure to antibiotics multiple times over the past few months. Last Mounjaro injection 3 weeks ago.   LUQ pain is noted chronically as well. She does take Celebrex daily. On omeprazole BID. No dysphagia. She is due for EGD regardless for variceal screening.  NASH: biopsy-proven. Possible early cirrhosis on biopsy. Labs are normal without thrombocytopenia. Spleen normal. Would not label as cirrhotic at this time; however, we have been pursuing EGDs to be proactive.      PLAN    Proceed with upper endoscopy by Dr. Gala Romney in near future (variceal screening and LUQ pain) the risks, benefits, and alternatives have been discussed with the patient in detail. The patient states understanding and desires to proceed.  Continue omeprazole BID Add Carafate Stool studies today Consider dedicated imaging of pancreas in 6-8 weeks due to elevated lipase recently. Highly suspect this is secondary to [facility] (pancreas normal on CT); however, to be thorough, can pursue this especially if EGD negative.  3 month follow-up   Annitta Needs, PhD, ANP-BC [facility]

## 2022-03-16 NOTE — Patient Instructions (Signed)
Please complete the stool studies as soon as you can.  We are arranging an upper endoscopy with Dr. Gala Romney in the near future!  You can take dicyclomine as needed up to 4 times a day.   I have sent in Carafate to take 4 times a day. You can crush the tablet and mix with a little bit of water.  We will see you in 3 months!  I enjoyed seeing you again today! As you know, I value our relationship and want to provide genuine, compassionate, and quality care. I welcome your feedback. If you receive a survey regarding your visit,  I greatly appreciate you taking time to fill this out. See you next time!  Annitta Needs, PhD, ANP-BC [facility]

## 2022-03-16 NOTE — Telephone Encounter (Signed)
LMOVM to call back to schedule EGD, Dr. Gala Romney, ASA 2, no metformin/Amaryl day of procedure, needs BMP per Vicente Males

## 2022-03-17 NOTE — Telephone Encounter (Signed)
Spoke with pt. She has been scheduled for 7/13 at 8:30am. Aware will mail instructions.  PA approved via [facility]. Auth# [id number], DOS: Apr 28, 2022 - Jul 27, 2022

## 2022-03-23 DIAGNOSIS — R197 Diarrhea, unspecified: Secondary | ICD-10-CM | POA: Diagnosis not present

## 2022-03-23 DIAGNOSIS — A09 Infectious gastroenteritis and colitis, unspecified: Secondary | ICD-10-CM | POA: Diagnosis not present

## 2022-03-25 LAB — GASTROINTESTINAL PATHOGEN PNL
CampyloBacter Group: NOT DETECTED
Norovirus GI/GII: NOT DETECTED
Rotavirus A: NOT DETECTED
Salmonella species: NOT DETECTED
Shiga Toxin 1: NOT DETECTED
Shiga Toxin 2: NOT DETECTED
Shigella Species: NOT DETECTED
Vibrio Group: NOT DETECTED
Yersinia enterocolitica: NOT DETECTED

## 2022-03-25 LAB — C. DIFFICILE GDH AND TOXIN A/B
GDH ANTIGEN: NOT DETECTED
MICRO NUMBER:: 13497798
SPECIMEN QUALITY:: ADEQUATE
TOXIN A AND B: NOT DETECTED

## 2022-03-29 ENCOUNTER — Other Ambulatory Visit: Payer: Self-pay | Admitting: Family Medicine

## 2022-03-30 ENCOUNTER — Ambulatory Visit: Payer: Medicare Other | Admitting: Physician Assistant

## 2022-03-30 VITALS — BP 148/60 | HR 69

## 2022-03-30 DIAGNOSIS — N3281 Overactive bladder: Secondary | ICD-10-CM

## 2022-03-30 DIAGNOSIS — N3000 Acute cystitis without hematuria: Secondary | ICD-10-CM | POA: Diagnosis not present

## 2022-03-30 LAB — URINALYSIS, ROUTINE W REFLEX MICROSCOPIC
Bilirubin, UA: NEGATIVE
Glucose, UA: NEGATIVE
Ketones, UA: NEGATIVE
Leukocytes,UA: NEGATIVE
Nitrite, UA: NEGATIVE
Protein,UA: NEGATIVE
RBC, UA: NEGATIVE
Specific Gravity, UA: 1.02 (ref 1.005–1.030)
Urobilinogen, Ur: 1 mg/dL (ref 0.2–1.0)
pH, UA: 6.5 (ref 5.0–7.5)

## 2022-03-30 NOTE — Progress Notes (Signed)
post void residual=0 ?

## 2022-03-30 NOTE — Progress Notes (Signed)
Assessment: 1. Acute cystitis without hematuria - Urinalysis, Routine w reflex microscopic  2. OAB (overactive bladder) - BLADDER SCAN AMB NON-IMAGING    Plan: Continue Myrbetriq 25 BID and FU in 6 months for UA and PVR  Chief Complaint: No chief complaint on file.   HPI: Catherine Moore is a 66 y.o. female who presents for continued evaluation of pelvic floor pain, OAB symptoms, ongoing urinary incontinence.  She has completed pelvic floor therapy and states she is doing much better since her last visit.  No gross hematuria, burning, dysuria.  She is doing well on Myrbetriq 25 mg BID and is very happy with her current urinary status.  Urine is clear today.  PVR 0  /13/23 Catherine Moore is a 66 y.o. female who presents for continued evaluation of pelvic floor pain, urinary frequency, urgency, burning and incontinence.  No fever, chills, gross hematuria.  Patient continues to have difficulty at work where she does a lot of standing which makes her urgency and incontinence symptoms worse.  She continues Myrbetriq 25 mg twice daily. Cystoscopic exam 1 month ago unremarkable and recent CT stone study indicated no evidence of urinary tract calculus, hydronephrosis, obstruction.  Possible pelvic floor laxity noted by radiology. PVR = 0 mL UA = 11-30 WBCs, few bacteria, nitrite negative   Portions of the above documentation were copied from a prior visit for review purposes only.  Allergies: Allergies  Allergen Reactions   Mounjaro [Tirzepatide] Diarrhea   Zocor [Simvastatin] Nausea Only    Pt's Dr took pt off Zocor due to nausea    Penicillins Swelling and Rash    Has patient had a PCN reaction causing immediate rash, facial/tongue/throat swelling, SOB or lightheadedness with hypotension:Yes Has patient had a PCN reaction causing severe rash involving mucus membranes or skin necrosis:NO Has patient had a PCN reaction that required hospitalization No Has patient had a PCN reaction  occurring within the last 10 years: No If all of the above answers are "NO", then may proceed with Cephalosporin use.     PMH: Past Medical History:  Diagnosis Date   Anxiety    Arthritis    Cirrhosis ([place])    Complication of anesthesia    Depression    Diabetes ([place])    GERD (gastroesophageal reflux disease)    Hyperlipidemia    PONV (postoperative nausea and vomiting)     PSH: Past Surgical History:  Procedure Laterality Date   BIOPSY  08/16/2016   Procedure: BIOPSY;  Surgeon: Daneil Dolin, MD;  [facility];  Service: Endoscopy;;   BIOPSY  08/16/2018   Procedure: BIOPSY;  Surgeon: Daneil Dolin, MD;  [facility];  Service: Endoscopy;;  gastric   COLONOSCOPY  08/09/2010   ZOX:WRUEAV rectum colon   COLONOSCOPY WITH PROPOFOL N/A 04/07/2020   two 4-6 mm polyps in descending colon and cecum, one 3 mm polyp in cecum. (Tubular and hyperplastic. Surveillance in 5 years).   ENDOMETRIAL ABLATION     ESOPHAGOGASTRODUODENOSCOPY N/A 08/16/2016   Dr. Gala Romney: normal esophagus, GAVE, multiple gastric polyps s/p biopsy (hyperplastic), normal duodenum    ESOPHAGOGASTRODUODENOSCOPY (EGD) WITH PROPOFOL N/A 08/16/2018   Rourk: Multiple gastric polyps, pathology revealed fundic gland, gastric biopsies negative for H. pylori.   IR GENERIC HISTORICAL  06/13/2016   IR TRANSCATHETER BX 06/13/2016 [facility]   IR GENERIC HISTORICAL  06/13/2016   IR US GUIDE VASC ACCESS RIGHT 06/13/2016 [facility]   IR GENERIC HISTORICAL  06/13/2016  IR VENOGRAM HEPATIC W HEMODYNAMIC EVALUATION 06/13/2016 [facility]   POLYPECTOMY  04/07/2020   Procedure: POLYPECTOMY;  Surgeon: Daneil Dolin, MD;  [facility];  Service: Endoscopy;;    SH: Social History   Tobacco Use   Smoking status: Former    Packs/day: 1.50    Years: 20.00    Total pack years: 30.00    Types: Cigarettes    Start date: 05/23/1971    Quit date: 06/16/1991    Years since quitting: 30.8     Passive exposure: Past   Smokeless tobacco: Never  Vaping Use   Vaping Use: Never used  Substance Use Topics   Alcohol use: No    Alcohol/week: 0.0 standard drinks of alcohol   Drug use: No    ROS: See HPI  PE: BP (!) 148/60   Pulse 69  GENERAL APPEARANCE:  Well appearing, well developed, well nourished, NAD HEENT:  Atraumatic, normocephalic NECK:  Supple. Trachea midline ABDOMEN:  Soft, non-tender, no masses EXTREMITIES:  Moves all extremities well, without clubbing, cyanosis, or edema NEUROLOGIC:  Alert and oriented x 3, normal gait, CN II-XII grossly intact MENTAL STATUS:  appropriate BACK:  Non-tender to palpation, No CVAT SKIN:  Warm, dry, and intact   Results: Laboratory Data: Lab Results  Component Value Date   WBC 6.3 11/18/2021   HGB 12.8 11/18/2021   HCT 37.6 11/18/2021   MCV 86 11/18/2021   PLT 234 11/18/2021    Lab Results  Component Value Date   CREATININE 0.98 02/24/2022    Lab Results  Component Value Date   HGBA1C 5.8 (H) 11/18/2021    Urinalysis    Component Value Date/Time   APPEARANCEUR Clear 12/27/2021 1552   GLUCOSEU Negative 12/27/2021 1552   BILIRUBINUR Negative 12/27/2021 1552   KETONESUR negative 11/18/2021 0848   PROTEINUR Trace (A) 12/27/2021 1552   UROBILINOGEN 0.2 11/18/2021 0848   NITRITE Negative 12/27/2021 1552   LEUKOCYTESUR Trace (A) 12/27/2021 1552    Lab Results  Component Value Date   LABMICR See below: 12/27/2021   WBCUA 11-30 (A) 12/27/2021   LABEPIT 0-10 12/27/2021   MUCUS Present 12/27/2021   BACTERIA Few 12/27/2021    Pertinent Imaging: No results found for this or any previous visit.  No results found for this or any previous visit.  No results found for this or any previous visit.  No results found for this or any previous visit.  No results found for this or any previous visit.  No results found for this or any previous visit.  No results found for this or any previous visit.  Results for  orders placed in visit on 12/03/21  CT RENAL STONE STUDY  Narrative CLINICAL DATA:  Flank pain, kidney stone suspected. Lower abdominal pain and bladder pain for 2 months. History of kidney stones.  EXAM: CT ABDOMEN AND PELVIS WITHOUT CONTRAST  TECHNIQUE: Multidetector CT imaging of the abdomen and pelvis was performed following the standard protocol without IV contrast.  RADIATION DOSE REDUCTION: This exam was performed according to the departmental dose-optimization program which includes automated exposure control, adjustment of the mA and/or kV according to patient size and/or use of iterative reconstruction technique.  COMPARISON:  Abdominal ultrasound 07/27/2018. Report from abdominopelvic CT 02/13/2020.  FINDINGS: Lower chest: Clear lung bases. No significant pleural or pericardial effusion.  Hepatobiliary: The liver is normal in density without suspicious focal abnormality. No evidence of gallstones, gallbladder wall thickening or biliary dilatation.  Pancreas: Unremarkable. No pancreatic  ductal dilatation or surrounding inflammatory changes.  Spleen: Normal in size without focal abnormality.  Adrenals/Urinary Tract: Both adrenal glands appear normal. Both kidneys appear unremarkable as imaged in the noncontrast state. No evidence of urinary tract calculus, hydronephrosis or perinephric soft tissue stranding. The bladder is nearly empty and suboptimally evaluated. A small amount of air is present in the bladder lumen, likely iatrogenic. No apparent bladder wall thickening or surrounding inflammation.  Stomach/Bowel: A small amount of enteric contrast was administered. The stomach appears unremarkable for its degree of distension. No evidence of bowel wall thickening, distention or surrounding inflammatory change. The appendix appears normal.  Vascular/Lymphatic: There are no enlarged abdominal or pelvic lymph nodes. Mild aortic and branch vessel  atherosclerosis.  Reproductive: The uterus and ovaries appear unremarkable. No adnexal mass.  Other: No evidence of abdominal wall hernia or ascites. Possible pelvic floor laxity.  Musculoskeletal: No acute or significant osseous findings.  IMPRESSION: 1. No evidence of urinary tract calculus or hydronephrosis. 2. Small amount of air in the bladder, likely iatrogenic. The bladder otherwise appears unremarkable for its degree of distention. 3. Possible pelvic floor laxity. 4.  Aortic Atherosclerosis (ICD10-I70.0).   Electronically Signed By: Richardean Sale M.D. On: 12/03/2021 10:37  No results found for this or any previous visit (from the past 24 hour(s)).

## 2022-03-31 ENCOUNTER — Encounter: Payer: Self-pay | Admitting: Nurse Practitioner

## 2022-03-31 LAB — CMP14+EGFR
ALT: 15 IU/L (ref 0–32)
AST: 17 IU/L (ref 0–40)
Albumin/Globulin Ratio: 1.8 (ref 1.2–2.2)
Albumin: 4.3 g/dL (ref 3.8–4.8)
Alkaline Phosphatase: 52 IU/L (ref 44–121)
BUN/Creatinine Ratio: 14 (ref 12–28)
BUN: 10 mg/dL (ref 8–27)
Bilirubin Total: 0.4 mg/dL (ref 0.0–1.2)
CO2: 25 mmol/L (ref 20–29)
Calcium: 9.1 mg/dL (ref 8.7–10.3)
Chloride: 104 mmol/L (ref 96–106)
Creatinine, Ser: 0.7 mg/dL (ref 0.57–1.00)
Globulin, Total: 2.4 g/dL (ref 1.5–4.5)
Glucose: 126 mg/dL — ABNORMAL HIGH (ref 70–99)
Potassium: 4 mmol/L (ref 3.5–5.2)
Sodium: 144 mmol/L (ref 134–144)
Total Protein: 6.7 g/dL (ref 6.0–8.5)
eGFR: 96 mL/min/{1.73_m2} (ref >59)

## 2022-03-31 LAB — LIPASE: Lipase: 68 U/L (ref 14–72)

## 2022-04-13 DIAGNOSIS — Z79891 Long term (current) use of opiate analgesic: Secondary | ICD-10-CM | POA: Diagnosis not present

## 2022-04-13 DIAGNOSIS — G4700 Insomnia, unspecified: Secondary | ICD-10-CM | POA: Diagnosis not present

## 2022-04-13 DIAGNOSIS — M542 Cervicalgia: Secondary | ICD-10-CM | POA: Diagnosis not present

## 2022-04-13 DIAGNOSIS — M130 Polyarthritis, unspecified: Secondary | ICD-10-CM | POA: Diagnosis not present

## 2022-04-26 ENCOUNTER — Other Ambulatory Visit: Payer: Self-pay | Admitting: Family Medicine

## 2022-04-26 DIAGNOSIS — F419 Anxiety disorder, unspecified: Secondary | ICD-10-CM

## 2022-04-26 DIAGNOSIS — E119 Type 2 diabetes mellitus without complications: Secondary | ICD-10-CM

## 2022-04-27 ENCOUNTER — Other Ambulatory Visit (HOSPITAL_COMMUNITY): Payer: Medicare Other

## 2022-04-28 ENCOUNTER — Other Ambulatory Visit: Payer: Self-pay

## 2022-04-28 ENCOUNTER — Ambulatory Visit (HOSPITAL_COMMUNITY)
Admission: RE | Admit: 2022-04-28 | Discharge: 2022-04-28 | Disposition: A | Discharge status: Home / Self Care | Payer: Medicare Other | Attending: Internal Medicine | Admitting: Internal Medicine

## 2022-04-28 ENCOUNTER — Ambulatory Visit (HOSPITAL_COMMUNITY): Payer: Medicare Other | Admitting: Anesthesiology

## 2022-04-28 ENCOUNTER — Encounter (HOSPITAL_COMMUNITY)
Admission: RE | Disposition: A | Discharge status: Home / Self Care | Payer: Self-pay | Source: Home / Self Care | Attending: Internal Medicine

## 2022-04-28 ENCOUNTER — Ambulatory Visit (HOSPITAL_BASED_OUTPATIENT_CLINIC_OR_DEPARTMENT_OTHER): Payer: Medicare Other | Admitting: Anesthesiology

## 2022-04-28 ENCOUNTER — Encounter (HOSPITAL_COMMUNITY): Payer: Self-pay | Admitting: Internal Medicine

## 2022-04-28 DIAGNOSIS — F419 Anxiety disorder, unspecified: Secondary | ICD-10-CM | POA: Insufficient documentation

## 2022-04-28 DIAGNOSIS — K317 Polyp of stomach and duodenum: Secondary | ICD-10-CM | POA: Insufficient documentation

## 2022-04-28 DIAGNOSIS — M1990 Unspecified osteoarthritis, unspecified site: Secondary | ICD-10-CM | POA: Insufficient documentation

## 2022-04-28 DIAGNOSIS — K766 Portal hypertension: Secondary | ICD-10-CM | POA: Diagnosis not present

## 2022-04-28 DIAGNOSIS — K7460 Unspecified cirrhosis of liver: Secondary | ICD-10-CM | POA: Diagnosis not present

## 2022-04-28 DIAGNOSIS — Z13810 Encounter for screening for upper gastrointestinal disorder: Secondary | ICD-10-CM | POA: Diagnosis not present

## 2022-04-28 DIAGNOSIS — E1165 Type 2 diabetes mellitus with hyperglycemia: Secondary | ICD-10-CM | POA: Diagnosis not present

## 2022-04-28 DIAGNOSIS — K219 Gastro-esophageal reflux disease without esophagitis: Secondary | ICD-10-CM | POA: Insufficient documentation

## 2022-04-28 DIAGNOSIS — Z87891 Personal history of nicotine dependence: Secondary | ICD-10-CM | POA: Diagnosis not present

## 2022-04-28 DIAGNOSIS — F32A Depression, unspecified: Secondary | ICD-10-CM | POA: Insufficient documentation

## 2022-04-28 DIAGNOSIS — E119 Type 2 diabetes mellitus without complications: Secondary | ICD-10-CM | POA: Insufficient documentation

## 2022-04-28 DIAGNOSIS — Z7984 Long term (current) use of oral hypoglycemic drugs: Secondary | ICD-10-CM | POA: Diagnosis not present

## 2022-04-28 HISTORY — PX: ESOPHAGOGASTRODUODENOSCOPY (EGD) WITH PROPOFOL: SHX5813

## 2022-04-28 LAB — GLUCOSE, CAPILLARY: Glucose-Capillary: 140 mg/dL — ABNORMAL HIGH (ref 70–99)

## 2022-04-28 SURGERY — ESOPHAGOGASTRODUODENOSCOPY (EGD) WITH PROPOFOL
Anesthesia: General

## 2022-04-28 MED ORDER — LACTATED RINGERS IV SOLN: INTRAVENOUS | Status: DC | PRN

## 2022-04-28 MED ORDER — LIDOCAINE HCL 1 % IJ SOLN
INTRAMUSCULAR | Status: DC | PRN
  Administered 2022-04-28: 50 mg via INTRADERMAL

## 2022-04-28 MED ORDER — LACTATED RINGERS IV SOLN: INTRAVENOUS | Status: DC

## 2022-04-28 MED ORDER — PROPOFOL 10 MG/ML IV BOLUS
INTRAVENOUS | Status: DC | PRN
  Administered 2022-04-28: 30 mg via INTRAVENOUS
  Administered 2022-04-28: 100 mg via INTRAVENOUS

## 2022-04-28 MED ORDER — ONDANSETRON HCL 4 MG/2ML IJ SOLN
INTRAMUSCULAR | Status: DC | PRN
  Administered 2022-04-28: 4 mg via INTRAVENOUS

## 2022-04-28 NOTE — Op Note (Signed)
[facility] Patient Name: Catherine Moore Procedure Date: 04/28/2022 8:30 AM MRN: [id number] Date of Birth: 1956/08/23 Attending MD: Norvel Richards , MD CSN: [id number] Age: 66 Admit Type: Outpatient Procedure:                Upper GI endoscopy Indications:              Screening procedure, Portal hypertension rule out                            esophageal varices Providers:                Norvel Richards, MD, Rosina Lowenstein, RN, Starla Link RN, RN Referring MD:             Norvel Richards, MD Medicines:                Propofol per Anesthesia Complications:            No immediate complications. Estimated Blood Loss:     Estimated blood loss: none. Procedure:                Pre-Anesthesia Assessment:                           - Prior to the procedure, a History and Physical                            was performed, and patient medications and                            allergies were reviewed. The patient's tolerance of                            previous anesthesia was also reviewed. The risks                            and benefits of the procedure and the sedation                            options and risks were discussed with the patient.                            All questions were answered, and informed consent                            was obtained. Prior Anticoagulants: The patient has                            taken no previous anticoagulant or antiplatelet                            agents. ASA Grade Assessment: II - A patient with  mild systemic disease. After reviewing the risks                            and benefits, the patient was deemed in                            satisfactory condition to undergo the procedure.                           After obtaining informed consent, the endoscope was                            passed under direct vision. Throughout the                             procedure, the patient's blood pressure, pulse, and                            oxygen saturations were monitored continuously. The                            [id number]-H190 ([id number]) scope was introduced through the                            mouth, and advanced to the second part of duodenum.                            The upper GI endoscopy was accomplished without                            difficulty. The patient tolerated the procedure                            well. Scope In: 8:53:54 AM Scope Out: 8:56:10 AM Total Procedure Duration: 0 hours 2 minutes 16 seconds  Findings:      The examined esophagus was normal.      Scattered up to 5 mm hyperplastic appearing polyps (previously biopsied       and proven to be benign, hyperplastic). Gastric mucosa otherwise       appeared normal. Patent pylorus.      The duodenal bulb and second portion of the duodenum were normal. Impression:               - Normal esophagus. Gastric polyps as described                           - Normal duodenal bulb and second portion of the                            duodenum.                           - No specimens collected. Moderate Sedation:      Moderate (conscious) sedation was personally administered by an       anesthesia professional. The following parameters were  monitored: oxygen       saturation, heart rate, blood pressure, respiratory rate, EKG, adequacy       of pulmonary ventilation, and response to care. Recommendation:           - Patient has a contact number available for                            emergencies. The signs and symptoms of potential                            delayed complications were discussed with the                            patient. Return to normal activities tomorrow.                            Written discharge instructions were provided to the                            patient.                           - Advance diet as tolerated. Patient may not need                             close interval screening. Would follow clinical                            course, MELD and platelet count going forward to                            stratify risk. Office visit with [place] in 6 months. Procedure Code(s):        --- Professional ---                           [phone], Esophagogastroduodenoscopy, flexible,                            transoral; diagnostic, including collection of                            specimen(s) by brushing or washing, when performed                            (separate procedure) Diagnosis Code(s):        --- Professional ---                           Z13.810, Encounter for screening for upper                            gastrointestinal disorder                           K76.6, Portal hypertension CPT copyright 2019 American Medical Association. All rights reserved. The codes  documented in this report are preliminary and upon coder review may  be revised to meet current compliance requirements. Cristopher Estimable. Alaysia Lightle, MD Norvel Richards, MD 04/28/2022 9:10:17 AM This report has been signed electronically. Number of Addenda: 0

## 2022-04-28 NOTE — H&P (Signed)
_0 @   Primary Care Physician:  Coral Spikes, DO Primary Gastroenterologist:  Dr. Gala Romney  Pre-Procedure History & Physical: HPI:  Catherine Moore is a 66 y.o. female here for screening for esophageal varices.  Evidence of portal hypertension on prior studies.  History of left upper quadrant abdominal pain.  History of multiple gastric polyps (hyperplastic-proven on biopsy previously). Denies dysphagia.  Overall feeling much better (nominal pain has resolved) since coming off of Mounjaro.  Past Medical History:  Diagnosis Date   Anxiety    Arthritis    Cirrhosis ([place])    Complication of anesthesia    Depression    Diabetes ([place])    GERD (gastroesophageal reflux disease)    Hyperlipidemia    PONV (postoperative nausea and vomiting)     Past Surgical History:  Procedure Laterality Date   BIOPSY  08/16/2016   Procedure: BIOPSY;  Surgeon: Daneil Dolin, MD;  [facility]: [facility];  Service: Endoscopy;;   BIOPSY  08/16/2018   Procedure: BIOPSY;  Surgeon: Daneil Dolin, MD;  Location: [facility];  Service: Endoscopy;;  gastric   COLONOSCOPY  08/09/2010   CHY:IFOYDX rectum colon   COLONOSCOPY WITH PROPOFOL N/A 04/07/2020   two 4-6 mm polyps in descending colon and cecum, one 3 mm polyp in cecum. (Tubular and hyperplastic. Surveillance in 5 years).   ENDOMETRIAL ABLATION     ESOPHAGOGASTRODUODENOSCOPY N/A 08/16/2016   Dr. Gala Romney: normal esophagus, GAVE, multiple gastric polyps s/p biopsy (hyperplastic), normal duodenum    ESOPHAGOGASTRODUODENOSCOPY (EGD) WITH PROPOFOL N/A 08/16/2018   Rahel Carlton: Multiple gastric polyps, pathology revealed fundic gland, gastric biopsies negative for H. pylori.   IR GENERIC HISTORICAL  06/13/2016   IR TRANSCATHETER BX 06/13/2016 [facility]   IR GENERIC HISTORICAL  06/13/2016   IR US GUIDE VASC ACCESS RIGHT 06/13/2016 [facility]   IR GENERIC HISTORICAL  06/13/2016   IR VENOGRAM HEPATIC W HEMODYNAMIC EVALUATION 06/13/2016 [facility]    POLYPECTOMY  04/07/2020   Procedure: POLYPECTOMY;  Surgeon: Daneil Dolin, MD;  [facility]: [facility];  Service: Endoscopy;;    Prior to Admission medications   Medication Sig Start Date End Date Taking? Authorizing Provider  ALPRAZolam Duanne Moron) 0.5 MG tablet Take 0.25-1 mg by mouth See admin instructions. Take 0.25 mg morning and in the evening and 1 mg at bedtime 11/03/21  Yes [provider]  cetirizine (ZYRTEC) 10 MG tablet TAKE 1 TABLET BY MOUTH EVERY DAY 03/02/22  Yes Cook, Jayce G, DO  dicyclomine (BENTYL) 10 MG capsule Take 10 mg by mouth 4 (four) times daily as needed for spasms. 02/13/20  Yes [provider]  DULoxetine (CYMBALTA) 60 MG capsule TAKE 1 CAPSULE BY MOUTH EVERY DAY 04/26/22  Yes Cook, Jayce G, DO  estradiol (ESTRACE) 0.1 MG/GM vaginal cream Apply a pea size amount to the urethra(anterior vaginal canal) twice weekly. 11/29/21  Yes [place], Berneice Heinrich, PA-C  fluticasone (FLONASE) 50 MCG/ACT nasal spray INSTILL 2 SPRAYS IN EACH NOSTRIL EVERY DAY Patient taking differently: Place 1 spray into both nostrils daily as needed for rhinitis. 12/08/20  Yes Lovena Le, [name], DO  gabapentin (NEURONTIN) 300 MG capsule Take 600 mg by mouth at bedtime.   Yes [provider]  glipiZIDE (GLUCOTROL XL) 5 MG 24 hr tablet Take 1 tablet (5 mg total) by mouth daily with breakfast. 02/22/22  Yes Ameduite, Trenton Gammon, NP  HYDROcodone-acetaminophen (NORCO) 10-325 MG tablet Take 1 tablet by mouth 3 (three) times daily as needed for  moderate pain.   Yes [provider]  Lactobacillus Rhamnosus, GG, (CULTURELLE PO) Take 1 capsule by mouth daily.    Yes [provider]  metFORMIN (GLUCOPHAGE) 500 MG tablet TAKE 1 TABLET BY MOUTH TWICE DAILY WITH A MEAL 04/26/22  Yes Cook, Jayce G, DO  mirabegron ER (MYRBETRIQ) 25 MG TB24 tablet Take 1 tablet (25 mg total) by mouth 2 (two) times daily. 02/07/22  Yes [place], Berneice Heinrich, PA-C  omeprazole (PRILOSEC) 20 MG  capsule TAKE ONE CAPSULE BY MOUTH TWICE DAILY BEFORE A MEAL 03/29/22  Yes Cook, Jayce G, DO  rosuvastatin (CRESTOR) 10 MG tablet TAKE 1 TABLET BY MOUTH EVERY DAY Patient taking differently: Take 10 mg by mouth at bedtime. 12/31/21  Yes Nilda Simmer, NP  vitamin B-12 (CYANOCOBALAMIN) 1000 MCG tablet Take 1,000 mcg by mouth daily.   Yes [provider]  blood glucose meter kit and supplies Dispense based on patient and insurance preference. Use up to four times daily as directed. (FOR ICD-10 E10.9, E11.9). 02/11/19   Mikey Kirschner, MD    Allergies as of 03/17/2022 - Review Complete 03/16/2022  Allergen Reaction Noted   Mounjaro [tirzepatide] Diarrhea 02/22/2022   Zocor [simvastatin] Nausea Only 02/01/2013   Penicillins Swelling and Rash 07/19/2010    Family History  Problem Relation Age of Onset   Diabetes Father    Hypertension Father    Heart attack Father 46       MI   Stroke Father    Osteoporosis Mother    Arthritis Sister    Arthritis Brother    Colon cancer Neg Hx    Liver disease Neg Hx     Social History   Socioeconomic History   Marital status: Married    Spouse name: Not on file   Number of children: Not on file   Years of education: Not on file   Highest education level: Not on file  Occupational History   Not on file  Tobacco Use   Smoking status: Former    Packs/day: 1.50    Years: 20.00    Total pack years: 30.00    Types: Cigarettes    Start date: 05/23/1971    Quit date: 06/16/1991    Years since quitting: 30.8    Passive exposure: Past   Smokeless tobacco: Never  Vaping Use   Vaping Use: Never used  Substance and Sexual Activity   Alcohol use: No    Alcohol/week: 0.0 standard drinks of alcohol   Drug use: No   Sexual activity: Yes    Birth control/protection: Post-menopausal, Surgical  Other Topics Concern   Not on file  Social History Narrative   Not on file   Social Determinants of Health   Financial Resource Strain: Not  on file  Food Insecurity: Not on file  Transportation Needs: Not on file  Physical Activity: Not on file  Stress: Not on file  Social Connections: Not on file  Intimate Partner Violence: Not on file    Review of Systems: See HPI, otherwise negative ROS  Physical Exam: BP (!) 142/59   Pulse 64   Temp 98.5 F (36.9 C) (Oral)   Resp 17   Ht _0  (1.676 m)   Wt 85.3 kg   SpO2 98%   BMI 30.34 kg/m  General:   Alert,  Well-developed, well-nourished, pleasant and cooperative in NAD Neck:  Supple; no masses or thyromegaly. No significant cervical adenopathy. Lungs:  Clear throughout to auscultation.  No wheezes, crackles, or rhonchi. No acute distress. Heart:  Regular rate and rhythm; no murmurs, clicks, rubs,  or gallops. Abdomen: Non-distended, normal bowel sounds.  Soft and nontender without appreciable mass or hepatosplenomegaly.  Pulses:  Normal pulses noted. Extremities:  Without clubbing or edema.  Impression/Plan: 66 year old lady with a least some advanced hepatic fibrosis possible early cirrhosis here for a screening EGD.  Left upper quadrant abdominal pain has resolved.  Likely drug effect.  I have offered the patient a screening EGD today. .The risks, benefits, limitations, alternatives and imponderables have been reviewed with the patient. Potential for esophageal dilation, biopsy, etc. have also been reviewed.  Questions have been answered. All parties agreeable.      Notice: This dictation was prepared with Dragon dictation along with smaller phrase technology. Any transcriptional errors that result from this process are unintentional and may not be corrected upon review.

## 2022-04-28 NOTE — Anesthesia Postprocedure Evaluation (Signed)
Anesthesia Post Note  Patient: [name]  Procedure(s) Performed: ESOPHAGOGASTRODUODENOSCOPY (EGD) WITH PROPOFOL  Patient location during evaluation: Endoscopy Anesthesia Type: General Level of consciousness: awake Pain management: pain level controlled Vital Signs Assessment: post-procedure vital signs reviewed and stable Respiratory status: spontaneous breathing Cardiovascular status: blood pressure returned to baseline and stable Postop Assessment: no apparent nausea or vomiting Anesthetic complications: no   No notable events documented.   Last Vitals:  Vitals:   04/28/22 0726  BP: (!) 142/59  Pulse: 64  Resp: 17  Temp: 36.9 C  SpO2: 98%    Last Pain:  Vitals:   04/28/22 0847  TempSrc:   PainSc: 0-No pain                 Sherese Heyward

## 2022-04-28 NOTE — Transfer of Care (Signed)
Immediate Anesthesia Transfer of Care Note  Patient: Catherine Moore  Procedure(s) Performed: ESOPHAGOGASTRODUODENOSCOPY (EGD) WITH PROPOFOL  Patient Location: Endoscopy Unit  Anesthesia Type:General  Level of Consciousness: awake  Airway & Oxygen Therapy: Patient Spontanous Breathing  Post-op Assessment: Report given to RN  Post vital signs: Reviewed  Last Vitals:  Vitals Value Taken Time  BP    Temp    Pulse    Resp    SpO2      Last Pain:  Vitals:   04/28/22 0847  TempSrc:   PainSc: 0-No pain         Complications: No notable events documented.

## 2022-04-28 NOTE — Discharge Instructions (Addendum)
EGD Discharge instructions Please read the instructions outlined below and refer to this sheet in the next few weeks. These discharge instructions provide you with general information on caring for yourself after you leave the hospital. Your doctor may also give you specific instructions. While your treatment has been planned according to the most current medical practices available, unavoidable complications occasionally occur. If you have any problems or questions after discharge, please call your doctor. ACTIVITY You may resume your regular activity but move at a slower pace for the next 24 hours.  Take frequent rest periods for the next 24 hours.  Walking will help expel (get rid of) the air and reduce the bloated feeling in your abdomen.  No driving for 24 hours (because of the anesthesia (medicine) used during the test).  You may shower.  Do not sign any important legal documents or operate any machinery for 24 hours (because of the anesthesia used during the test).  NUTRITION Drink plenty of fluids.  You may resume your normal diet.  Begin with a light meal and progress to your normal diet.  Avoid alcoholic beverages for 24 hours or as instructed by your caregiver.  MEDICATIONS You may resume your normal medications unless your caregiver tells you otherwise.  WHAT YOU CAN EXPECT TODAY You may experience abdominal discomfort such as a feeling of fullness or "gas" pains.  FOLLOW-UP Your doctor will discuss the results of your test with you.  SEEK IMMEDIATE MEDICAL ATTENTION IF ANY OF THE FOLLOWING OCCUR: Excessive nausea (feeling sick to your stomach) and/or vomiting.  Severe abdominal pain and distention (swelling).  Trouble swallowing.  Temperature over 101 F (37.8 C).  Rectal bleeding or vomiting of blood.    No evidence of cirrhosis found on her EGD today.  This is a good report.  We will plan to see you back Roseanne Kaufman) in 6 months in the office if not already scheduled  [place]  At patient request, I called Trilby Drummer at [phone] -reviewed findings and recommendations

## 2022-04-28 NOTE — Anesthesia Preprocedure Evaluation (Signed)
Anesthesia Evaluation  Patient identified by MRN, date of birth, ID band Patient awake    Reviewed: Allergy & Precautions, NPO status , Patient's Chart, lab work & pertinent test results  History of Anesthesia Complications (+) PONV and history of anesthetic complications  Airway Mallampati: III  TM Distance: >3 FB Neck ROM: Full    Dental  (+) Partial Lower, Caps, Dental Advisory Given   Pulmonary neg pulmonary ROS, former smoker,    Pulmonary exam normal breath sounds clear to auscultation       Cardiovascular Exercise Tolerance: Good METS: 3 - Mets negative cardio ROS Normal cardiovascular exam Rhythm:Regular Rate:Normal - Systolic murmurs, - Diastolic murmurs and - Friction Rub    Neuro/Psych PSYCHIATRIC DISORDERS Anxiety Depression negative neurological ROS     GI/Hepatic GERD  Medicated,(+) Cirrhosis       , Hepatitis -C/o nausea   Endo/Other  negative endocrine ROSdiabetes, Poorly Controlled, Type 2  Renal/GU negative Renal ROS     Musculoskeletal  (+) Arthritis , Osteoarthritis,    Abdominal   Peds  Hematology negative hematology ROS (+)   Anesthesia Other Findings   Reproductive/Obstetrics                             Anesthesia Physical  Anesthesia Plan  ASA: III  Anesthesia Plan: General   Post-op Pain Management:    Induction: Intravenous  PONV Risk Score and Plan: 3 and TIVA and Ondansetron  Airway Management Planned: Natural Airway and Nasal Cannula  Additional Equipment:   Intra-op Plan:   Post-operative Plan:   Informed Consent: I have reviewed the patients History and Physical, chart, labs and discussed the procedure including the risks, benefits and alternatives for the proposed anesthesia with the patient or authorized representative who has indicated his/her understanding and acceptance.     Dental advisory given  Plan Discussed with:  CRNA  Anesthesia Plan Comments:         Anesthesia Quick Evaluation

## 2022-05-03 ENCOUNTER — Telehealth: Payer: Self-pay

## 2022-05-03 NOTE — Telephone Encounter (Signed)
FYI  Documentation in yellow folder on your desk from [facility] regarding codes for this pts GI Pathogen Panel

## 2022-05-04 ENCOUNTER — Encounter (HOSPITAL_COMMUNITY): Payer: Self-pay | Admitting: Internal Medicine

## 2022-05-17 ENCOUNTER — Other Ambulatory Visit (HOSPITAL_COMMUNITY): Payer: Self-pay | Admitting: Family Medicine

## 2022-05-17 DIAGNOSIS — Z1231 Encounter for screening mammogram for malignant neoplasm of breast: Secondary | ICD-10-CM

## 2022-05-20 ENCOUNTER — Other Ambulatory Visit: Payer: Self-pay | Admitting: Family Medicine

## 2022-05-26 ENCOUNTER — Ambulatory Visit (HOSPITAL_COMMUNITY): Payer: Medicare Other

## 2022-05-26 ENCOUNTER — Other Ambulatory Visit: Payer: Self-pay | Admitting: Family Medicine

## 2022-05-27 ENCOUNTER — Ambulatory Visit (HOSPITAL_COMMUNITY): Payer: Medicare Other

## 2022-05-31 NOTE — Telephone Encounter (Signed)
We will try diagnosis code A09. I have written this on the form.

## 2022-06-01 NOTE — Telephone Encounter (Signed)
Noted. Faxed to [employer]

## 2022-06-08 ENCOUNTER — Ambulatory Visit: Payer: Medicare Other

## 2022-06-26 ENCOUNTER — Other Ambulatory Visit: Payer: Self-pay | Admitting: Nurse Practitioner

## 2022-06-26 ENCOUNTER — Other Ambulatory Visit: Payer: Self-pay | Admitting: Family Medicine

## 2022-06-26 DIAGNOSIS — F32A Depression, unspecified: Secondary | ICD-10-CM

## 2022-06-26 DIAGNOSIS — E119 Type 2 diabetes mellitus without complications: Secondary | ICD-10-CM

## 2022-06-26 DIAGNOSIS — F419 Anxiety disorder, unspecified: Secondary | ICD-10-CM

## 2022-07-06 ENCOUNTER — Telehealth: Payer: Self-pay | Admitting: *Deleted

## 2022-07-06 DIAGNOSIS — M130 Polyarthritis, unspecified: Secondary | ICD-10-CM | POA: Diagnosis not present

## 2022-07-06 DIAGNOSIS — Z79891 Long term (current) use of opiate analgesic: Secondary | ICD-10-CM | POA: Diagnosis not present

## 2022-07-06 DIAGNOSIS — G4700 Insomnia, unspecified: Secondary | ICD-10-CM | POA: Diagnosis not present

## 2022-07-06 DIAGNOSIS — M542 Cervicalgia: Secondary | ICD-10-CM | POA: Diagnosis not present

## 2022-07-06 NOTE — Telephone Encounter (Signed)
Dr Freddie Apley office is closing the end of the year and she was a pain management patient and receives hydrocodone and xanax form their office and wants to know if Dr Lacinda Axon can take over these meds or is she going to have to find a doctor in [place]

## 2022-07-07 NOTE — Telephone Encounter (Signed)
Left message to return call at Dr Freddie Apley office

## 2022-07-07 NOTE — Telephone Encounter (Signed)
Coral Spikes, DO     Will one of you all call Dr. Freddie Apley office and see what they are advising his patients. I am not going to be managing all of these chronic opoids. This is the second request in the last 24 hours.

## 2022-07-07 NOTE — Telephone Encounter (Signed)
Catherine Moore nurse calling back in regards to message left. Catherine Moore will send a personal message to Catherine Moore who sees this patient because she is handling this a little differently. Pt does have appt with them on 09/28/22 and will receive last script then. Catherine Moore will give [place] a call back once Catherine Moore has responded to her personal message.

## 2022-07-13 NOTE — Telephone Encounter (Signed)
Patient called to check the status of this request. She asked did she need to make an appointment to discuss. She states that she isn't misusing the medication she just don't really want to drive to [facility] for medication.  CBB# [phone]

## 2022-07-14 NOTE — Telephone Encounter (Signed)
[name] G, DO     I recommend that she see pain management in  as recommended by Dr. Merlene Laughter. I am not taking on additional pain management.   Dr. Lacinda Axon

## 2022-07-14 NOTE — Telephone Encounter (Signed)
Patient notified and verbalized understanding. 

## 2022-07-27 ENCOUNTER — Other Ambulatory Visit: Payer: Self-pay | Admitting: Family Medicine

## 2022-07-27 ENCOUNTER — Other Ambulatory Visit: Payer: Self-pay | Admitting: Nurse Practitioner

## 2022-07-27 DIAGNOSIS — E119 Type 2 diabetes mellitus without complications: Secondary | ICD-10-CM

## 2022-08-01 ENCOUNTER — Ambulatory Visit (INDEPENDENT_AMBULATORY_CARE_PROVIDER_SITE_OTHER): Payer: Medicare Other

## 2022-08-01 VITALS — Wt 188.0 lb

## 2022-08-01 DIAGNOSIS — Z0000 Encounter for general adult medical examination without abnormal findings: Secondary | ICD-10-CM

## 2022-08-01 NOTE — Progress Notes (Signed)
Virtual Visit via Telephone Note  I connected with  Catherine Moore on 08/01/22 at  3:15 PM EDT by telephone and verified that I am speaking with the correct person using two identifiers.  Location: Patient: home Provider: [facility] Persons participating in the virtual visit: patient/Nurse Health Advisor   I discussed the limitations, risks, security and privacy concerns of performing an evaluation and management service by telephone and the availability of in person appointments. The patient expressed understanding and agreed to proceed.  Interactive audio and video telecommunications were attempted between this nurse and patient, however failed, due to patient having technical difficulties OR patient did not have access to video capability.  We continued and completed visit with audio only.  Some vital signs may be absent or patient reported.   Dionisio David, LPN  Subjective:   Catherine Moore is a 66 y.o. female who presents for Medicare Annual (Subsequent) preventive examination.  Review of Systems     Cardiac Risk Factors include: advanced age (>71mn, >>38women);dyslipidemia     Objective:    There were no vitals filed for this visit. There is no height or weight on file to calculate BMI.     08/01/2022    3:16 PM 04/28/2022    7:18 AM 11/26/2020    3:55 PM 04/07/2020    6:32 AM 08/16/2018    7:10 AM 08/16/2018    7:05 AM 08/13/2018    9:06 AM  Advanced Directives  Does Patient Have a Medical Advance Directive? No No No No No No No  Would patient like information on creating a medical advance directive? No - Patient declined No - Patient declined No - Patient declined No - Patient declined No - Patient declined No - Patient declined No - Patient declined    Current Medications (verified) Outpatient Encounter Medications as of 08/01/2022  Medication Sig   ALPRAZolam (XANAX) 0.5 MG tablet Take 0.25-1 mg by mouth See admin instructions. Take 0.25 mg morning and in the  evening and 1 mg at bedtime   Ascorbic Acid (VITAMIN C) 1000 MG tablet Take 1,000 mg by mouth daily.   blood glucose meter kit and supplies Dispense based on patient and insurance preference. Use up to four times daily as directed. (FOR ICD-10 E10.9, E11.9).   cetirizine (ZYRTEC) 10 MG tablet TAKE 1 TABLET BY MOUTH EVERY DAY   dicyclomine (BENTYL) 10 MG capsule Take 10 mg by mouth 4 (four) times daily as needed for spasms.   DULoxetine (CYMBALTA) 60 MG capsule TAKE 1 CAPSULE BY MOUTH EVERY DAY   estradiol (ESTRACE) 0.1 MG/GM vaginal cream Apply a pea size amount to the urethra(anterior vaginal canal) twice weekly.   fluticasone (FLONASE) 50 MCG/ACT nasal spray INSTILL 2 SPRAYS IN EACH NOSTRIL EVERY DAY   gabapentin (NEURONTIN) 300 MG capsule Take 600 mg by mouth at bedtime.   glipiZIDE (GLUCOTROL XL) 5 MG 24 hr tablet TAKE 1 TABLET BY MOUTH EVERY DAY WITH BREAKFAST   HYDROcodone-acetaminophen (NORCO) 10-325 MG tablet Take 1 tablet by mouth 3 (three) times daily as needed for moderate pain.   metFORMIN (GLUCOPHAGE) 500 MG tablet TAKE 1 TABLET BY MOUTH TWICE DAILY WITH A MEAL   omeprazole (PRILOSEC) 20 MG capsule TAKE ONE CAPSULE BY MOUTH TWICE DAILY BEFORE A MEAL   rosuvastatin (CRESTOR) 10 MG tablet TAKE 1 TABLET BY MOUTH EVERY DAY   mirabegron ER (MYRBETRIQ) 25 MG TB24 tablet Take 1 tablet (25 mg total) by mouth 2 (two) times daily. (  Patient not taking: Reported on 08/01/2022)   vitamin B-12 (CYANOCOBALAMIN) 1000 MCG tablet Take 1,000 mcg by mouth daily.   [DISCONTINUED] Lactobacillus Rhamnosus, GG, (CULTURELLE PO) Take 1 capsule by mouth daily.  (Patient not taking: Reported on 08/01/2022)   No facility-administered encounter medications on file as of 08/01/2022.    Allergies (verified) Mounjaro [tirzepatide], Zocor [simvastatin], and Penicillins   History: Past Medical History:  Diagnosis Date   Anxiety    Arthritis    Cirrhosis ([place])    Complication of anesthesia    Depression     Diabetes ([place])    GERD (gastroesophageal reflux disease)    Hyperlipidemia    PONV (postoperative nausea and vomiting)    Past Surgical History:  Procedure Laterality Date   BIOPSY  08/16/2016   Procedure: BIOPSY;  Surgeon: Daneil Dolin, MD;  [facility];  Service: Endoscopy;;   BIOPSY  08/16/2018   Procedure: BIOPSY;  Surgeon: Daneil Dolin, MD;  [facility];  Service: Endoscopy;;  gastric   COLONOSCOPY  08/09/2010   FXT:[id number] rectum colon   COLONOSCOPY WITH PROPOFOL N/A 04/07/2020   two 4-6 mm polyps in descending colon and cecum, one 3 mm polyp in cecum. (Tubular and hyperplastic. Surveillance in 5 years).   ENDOMETRIAL ABLATION     ESOPHAGOGASTRODUODENOSCOPY N/A 08/16/2016   Dr. Gala Romney: normal esophagus, GAVE, multiple gastric polyps s/p biopsy (hyperplastic), normal duodenum    ESOPHAGOGASTRODUODENOSCOPY (EGD) WITH PROPOFOL N/A 08/16/2018   Rourk: Multiple gastric polyps, pathology revealed fundic gland, gastric biopsies negative for H. pylori.   ESOPHAGOGASTRODUODENOSCOPY (EGD) WITH PROPOFOL N/A 04/28/2022   Procedure: ESOPHAGOGASTRODUODENOSCOPY (EGD) WITH PROPOFOL;  Surgeon: Daneil Dolin, MD;  [facility];  Service: Endoscopy;  Laterality: N/A;  8:30am   IR GENERIC HISTORICAL  06/13/2016   IR TRANSCATHETER BX 06/13/2016 [facility]   IR GENERIC HISTORICAL  06/13/2016   IR US GUIDE VASC ACCESS RIGHT 06/13/2016 [facility]   IR GENERIC HISTORICAL  06/13/2016   IR VENOGRAM HEPATIC W HEMODYNAMIC EVALUATION 06/13/2016 [facility]   POLYPECTOMY  04/07/2020   Procedure: POLYPECTOMY;  Surgeon: Daneil Dolin, MD;  [facility];  Service: Endoscopy;;   Family History  Problem Relation Age of Onset   Diabetes Father    Hypertension Father    Heart attack Father 20       MI   Stroke Father    Osteoporosis Mother    Arthritis Sister    Arthritis Brother    Colon cancer Neg Hx    Liver disease Neg Hx    Social History    Socioeconomic History   Marital status: Married    Spouse name: Not on file   Number of children: Not on file   Years of education: Not on file   Highest education level: Not on file  Occupational History   Not on file  Tobacco Use   Smoking status: Former    Packs/day: 1.50    Years: 20.00    Total pack years: 30.00    Types: Cigarettes    Start date: 05/23/1971    Quit date: 06/16/1991    Years since quitting: 31.1    Passive exposure: Past   Smokeless tobacco: Never  Vaping Use   Vaping Use: Never used  Substance and Sexual Activity   Alcohol use: No    Alcohol/week: 0.0 standard drinks of alcohol   Drug use: No   Sexual activity: Yes    Birth  control/protection: Post-menopausal, Surgical  Other Topics Concern   Not on file  Social History Narrative   Not on file   Social Determinants of Health   Financial Resource Strain: Low Risk  (08/01/2022)   Overall Financial Resource Strain (CARDIA)    Difficulty of Paying Living Expenses: Not hard at all  Food Insecurity: No Food Insecurity (08/01/2022)   Hunger Vital Sign    Worried About Running Out of Food in the Last Year: Never true    Ran Out of Food in the Last Year: Never true  Transportation Needs: No Transportation Needs (08/01/2022)   PRAPARE - Hydrologist (Medical): No    Lack of Transportation (Non-Medical): No  Physical Activity: Sufficiently Active (08/01/2022)   Exercise Vital Sign    Days of Exercise per Week: 4 days    Minutes of Exercise per Session: 60 min  Stress: No Stress Concern Present (08/01/2022)   [place]    Feeling of Stress : Not at all  Social Connections: Moderately Integrated (08/01/2022)   Social Connection and Isolation Panel [NHANES]    Frequency of Communication with Friends and Family: More than three times a week    Frequency of Social Gatherings with Friends and Family: Three times  a week    Attends Religious Services: More than 4 times per year    Active Member of Clubs or Organizations: No    Attends Archivist Meetings: Never    Marital Status: Married    Tobacco Counseling Counseling given: Not Answered   Clinical Intake:  Pre-visit preparation completed: Yes  Pain : No/denies pain     Nutritional Risks: None Diabetes: Yes CBG done?: No Did pt. bring in CBG monitor from home?: No  How often do you need to have someone help you when you read instructions, pamphlets, or other written materials from your doctor or pharmacy?: 1 - Never  Diabetic?yes Nutrition Risk Assessment:  Has the patient had any N/V/D within the last 2 months?  No  Does the patient have any non-healing wounds?  No  Has the patient had any unintentional weight loss or weight gain?  No   Diabetes:  Is the patient diabetic?  Yes  If diabetic, was a CBG obtained today?  No  Did the patient bring in their glucometer from home?  No  How often do you monitor your CBG's? never.   Financial Strains and Diabetes Management:  Are you having any financial strains with the device, your supplies or your medication? No .  Does the patient want to be seen by Chronic Care Management for management of their diabetes?  No  Would the patient like to be referred to a Nutritionist or for Diabetic Management?  No   Diabetic Exams:  Diabetic Eye Exam: Completed 06/04/21. Overdue for diabetic eye exam. Pt has been advised about the importance in completing this exam.   Diabetic Foot Exam: Completed 06/04/21. Pt has been advised about the importance in completing this exam.   Interpreter Needed?: No  Information entered by :: Kirke Shaggy, LPN   Activities of Daily Living    08/01/2022    3:17 PM  In your present state of health, do you have any difficulty performing the following activities:  Hearing? 0  Vision? 0  Difficulty concentrating or making decisions? 0  Walking or  climbing stairs? 0  Dressing or bathing? 0  Doing errands, shopping? 0  Preparing Food and eating ? N  Using the Toilet? N  In the past six months, have you accidently leaked urine? N  Do you have problems with loss of bowel control? N  Managing your Medications? N  Managing your Finances? N  Housekeeping or managing your Housekeeping? N    Patient Care Team: Coral Spikes, DO as PCP - General (Family Medicine)  Indicate any recent Medical Services you may have received from other than [facility] providers in the past year (date may be approximate).     Assessment:   This is a routine wellness examination for [facility].  Hearing/Vision screen Hearing Screening - Comments:: No aids Vision Screening - Comments:: Wears glasses- [facility] [place] issues and exercise activities discussed: Current Exercise Habits: Home exercise routine, Type of exercise: walking;strength training/weights, Time (Minutes): 60, Frequency (Times/Week): 4, Weekly Exercise (Minutes/Week): 240, Intensity: Mild   Goals Addressed             This Visit's Progress    DIET - EAT MORE FRUITS AND VEGETABLES         Depression Screen    08/01/2022    3:15 PM 03/26/2021    3:43 PM 11/01/2019    9:53 AM 12/31/2018    8:34 AM 10/03/2018    8:41 AM 04/24/2018    8:12 AM 01/17/2018    8:11 AM  PHQ 2/9 Scores  PHQ - 2 Score 0 0 0 3 3 0 4  PHQ- 9 Score 0   9 4  10     Fall Risk    08/01/2022    3:17 PM 03/26/2021    3:43 PM 12/31/2018    8:32 AM 10/03/2018    8:41 AM 04/24/2018    8:12 AM  Fall Risk   Falls in the past year? 0 0 0 0 No  Number falls in past yr: 0 0 0 0   Injury with Fall? 0 0 0 0   Risk for fall due to : No Fall Risks No Fall Risks     Follow up Falls prevention discussed;Falls evaluation completed Falls evaluation completed Falls evaluation completed Falls evaluation completed     FALL RISK PREVENTION PERTAINING TO THE HOME:  Any stairs in or around the home? No  If so, are there  any without handrails? No  Home free of loose throw rugs in walkways, pet beds, electrical cords, etc? Yes  Adequate lighting in your home to reduce risk of falls? Yes   ASSISTIVE DEVICES UTILIZED TO PREVENT FALLS:  Life alert? No  Use of a cane, walker or w/c? No  Grab bars in the bathroom? Yes  Shower chair or bench in shower? No  Elevated toilet seat or a handicapped toilet? No   Cognitive Function:        08/01/2022    3:18 PM  6CIT Screen  What Year? 0 points  What month? 0 points  What time? 0 points  Count back from 20 0 points  Months in reverse 0 points  Repeat phrase 0 points  Total Score 0 points    Immunizations Immunization History  Administered Date(s) Administered   Influenza, Seasonal, Injecte, Preservative Fre 07/07/2016   Influenza,inj,Quad PF,6+ Mos 10/20/2017, 07/04/2018, 08/24/2019   Influenza-Unspecified 07/17/2012, 09/15/2014, 08/21/2020   Moderna Sars-Covid-2 Vaccination 01/01/2020, 01/27/2020, 08/26/2020, 01/05/2021   Pneumococcal Polysaccharide-23 10/04/2016   Tdap 07/27/1998    TDAP status: Due, Education has been provided regarding the importance of this vaccine. Advised may  receive this vaccine at local pharmacy or Health Dept. Aware to provide a copy of the vaccination record if obtained from local pharmacy or Health Dept. Verbalized acceptance and understanding.  Flu Vaccine status: Due, Education has been provided regarding the importance of this vaccine. Advised may receive this vaccine at local pharmacy or Health Dept. Aware to provide a copy of the vaccination record if obtained from local pharmacy or Health Dept. Verbalized acceptance and understanding.  Pneumococcal vaccine status: Due, Education has been provided regarding the importance of this vaccine. Advised may receive this vaccine at local pharmacy or Health Dept. Aware to provide a copy of the vaccination record if obtained from local pharmacy or Health Dept. Verbalized  acceptance and understanding.  Covid-19 vaccine status: Completed vaccines  Qualifies for Shingles Vaccine? Yes   Zostavax completed Yes   Shingrix Completed?: No.    Education has been provided regarding the importance of this vaccine. Patient has been advised to call insurance company to determine out of pocket expense if they have not yet received this vaccine. Advised may also receive vaccine at local pharmacy or Health Dept. Verbalized acceptance and understanding.  Screening Tests Health Maintenance  Topic Date Due   Zoster Vaccines- Shingrix (1 of 2) Never done   TETANUS/TDAP  07/27/2008   Diabetic kidney evaluation - Urine ACR  04/25/2019   COVID-19 Vaccine (5 - Moderna series) 03/02/2021   Pneumonia Vaccine 29+ Years old (2 - PCV) 05/22/2021   MAMMOGRAM  11/07/2021   INFLUENZA VACCINE  05/17/2022   HEMOGLOBIN A1C  05/18/2022   FOOT EXAM  06/04/2022   OPHTHALMOLOGY EXAM  06/04/2022   Diabetic kidney evaluation - GFR measurement  03/31/2023   COLONOSCOPY (Pts 45-55yr Insurance coverage will need to be confirmed)  04/07/2030   DEXA SCAN  Completed   Hepatitis C Screening  Completed   HPV VACCINES  Aged Out    Health Maintenance  Health Maintenance Due  Topic Date Due   Zoster Vaccines- Shingrix (1 of 2) Never done   TETANUS/TDAP  07/27/2008   Diabetic kidney evaluation - Urine ACR  04/25/2019   COVID-19 Vaccine (5 - Moderna series) 03/02/2021   Pneumonia Vaccine 66 Years old (2 - PCV) 05/22/2021   MAMMOGRAM  11/07/2021   INFLUENZA VACCINE  05/17/2022   HEMOGLOBIN A1C  05/18/2022   FOOT EXAM  06/04/2022   OPHTHALMOLOGY EXAM  06/04/2022    Colorectal cancer screening: Type of screening: Colonoscopy. Completed 04/07/20. Repeat every 3 years- has appt already  Mammogram status: Completed 11/08/19. Repeat every year- has appt already  Bone Density status: Completed 11/08/19. Results reflect: Bone density results: NORMAL. Repeat every 5 years.  Lung Cancer Screening:  (Low Dose CT Chest recommended if Age 66-80years, 30 pack-year currently smoking OR have quit w/in 15years.) does not qualify.   Additional Screening:  Hepatitis C Screening: does qualify; Completed 10/05/15  Vision Screening: Recommended annual ophthalmology exams for early detection of glaucoma and other disorders of the eye. Is the patient up to date with their annual eye exam?  Yes  Who is the provider or what is the name of the office in which the patient attends annual eye exams? [facility] [place] pt is not established with a provider, would they like to be referred to a provider to establish care? No .   Dental Screening: Recommended annual dental exams for proper oral hygiene  Community Resource Referral / Chronic Care Management: CRR required this visit?  No  CCM required this visit?  No      Plan:     I have personally reviewed and noted the following in the patient's chart:   Medical and social history Use of alcohol, tobacco or illicit drugs  Current medications and supplements including opioid prescriptions. Patient is not currently taking opioid prescriptions. Functional ability and status Nutritional status Physical activity Advanced directives List of other physicians Hospitalizations, surgeries, and ER visits in previous 12 months Vitals Screenings to include cognitive, depression, and falls Referrals and appointments  In addition, I have reviewed and discussed with patient certain preventive protocols, quality metrics, and best practice recommendations. A written personalized care plan for preventive services as well as general preventive health recommendations were provided to patient.     Dionisio David, LPN   [id number]   Nurse Notes: none

## 2022-08-01 NOTE — Patient Instructions (Signed)
Catherine Moore , Thank you for taking time to come for your Medicare Wellness Visit. I appreciate your ongoing commitment to your health goals. Please review the following plan we discussed and let me know if I can assist you in the future.   Screening recommendations/referrals: Colonoscopy: 04/07/20 Mammogram: 11/08/19 Bone Density: 11/08/19 Recommended yearly ophthalmology/optometry visit for glaucoma screening and checkup Recommended yearly dental visit for hygiene and checkup  Vaccinations: Influenza vaccine: n/d Pneumococcal vaccine: 10/04/16 Tdap vaccine: 07/27/1998, due if have injury Shingles vaccine: Zostavax 09/07/16, had 1st Shingrix per pt   Covid-19:01/01/20, 01/27/20, 08/26/20, 01/05/21  Advanced directives: no  Conditions/risks identified: none  Next appointment: Follow up in one year for your annual wellness visit 08/15/23 @ 8:45 am by phone   Preventive Care 65 Years and Older, Female Preventive care refers to lifestyle choices and visits with your health care provider that can promote health and wellness. What does preventive care include? A yearly physical exam. This is also called an annual well check. Dental exams once or twice a year. Routine eye exams. Ask your health care provider how often you should have your eyes checked. Personal lifestyle choices, including: Daily care of your teeth and gums. Regular physical activity. Eating a healthy diet. Avoiding tobacco and drug use. Limiting alcohol use. Practicing safe sex. Taking low-dose aspirin every day. Taking vitamin and mineral supplements as recommended by your health care provider. What happens during an annual well check? The services and screenings done by your health care provider during your annual well check will depend on your age, overall health, lifestyle risk factors, and family history of disease. Counseling  Your health care provider may ask you questions about your: Alcohol use. Tobacco  use. Drug use. Emotional well-being. Home and relationship well-being. Sexual activity. Eating habits. History of falls. Memory and ability to understand (cognition). Work and work Statistician. Reproductive health. Screening  You may have the following tests or measurements: Height, weight, and BMI. Blood pressure. Lipid and cholesterol levels. These may be checked every 5 years, or more frequently if you are over 65 years old. Skin check. Lung cancer screening. You may have this screening every year starting at age 71 if you have a 30-pack-year history of smoking and currently smoke or have quit within the past 15 years. Fecal occult blood test (FOBT) of the stool. You may have this test every year starting at age 50. Flexible sigmoidoscopy or colonoscopy. You may have a sigmoidoscopy every 5 years or a colonoscopy every 10 years starting at age 31. Hepatitis C blood test. Hepatitis B blood test. Sexually transmitted disease (STD) testing. Diabetes screening. This is done by checking your blood sugar (glucose) after you have not eaten for a while (fasting). You may have this done every 1-3 years. Bone density scan. This is done to screen for osteoporosis. You may have this done starting at age 21. Mammogram. This may be done every 1-2 years. Talk to your health care provider about how often you should have regular mammograms. Talk with your health care provider about your test results, treatment options, and if necessary, the need for more tests. Vaccines  Your health care provider may recommend certain vaccines, such as: Influenza vaccine. This is recommended every year. Tetanus, diphtheria, and acellular pertussis (Tdap, Td) vaccine. You may need a Td booster every 10 years. Zoster vaccine. You may need this after age 2. Pneumococcal 13-valent conjugate (PCV13) vaccine. One dose is recommended after age 45. Pneumococcal polysaccharide (PPSV23) vaccine. One  dose is recommended  after age 65. Talk to your health care provider about which screenings and vaccines you need and how often you need them. This information is not intended to replace advice given to you by your health care provider. Make sure you discuss any questions you have with your health care provider. Document Released: 10/30/2015 Document Revised: 06/22/2016 Document Reviewed: 08/04/2015 Elsevier Interactive Patient Education  2017 [place] Prevention in the Home Falls can cause injuries. They can happen to people of all ages. There are many things you can do to make your home safe and to help prevent falls. What can I do on the outside of my home? Regularly fix the edges of walkways and driveways and fix any cracks. Remove anything that might make you trip as you walk through a door, such as a raised step or threshold. Trim any bushes or trees on the path to your home. Use bright outdoor lighting. Clear any walking paths of anything that might make someone trip, such as rocks or tools. Regularly check to see if handrails are loose or broken. Make sure that both sides of any steps have handrails. Any raised decks and porches should have guardrails on the edges. Have any leaves, snow, or ice cleared regularly. Use sand or salt on walking paths during winter. Clean up any spills in your garage right away. This includes oil or grease spills. What can I do in the bathroom? Use night lights. Install grab bars by the toilet and in the tub and shower. Do not use towel bars as grab bars. Use non-skid mats or decals in the tub or shower. If you need to sit down in the shower, use a plastic, non-slip stool. Keep the floor dry. Clean up any water that spills on the floor as soon as it happens. Remove soap buildup in the tub or shower regularly. Attach bath mats securely with double-sided non-slip rug tape. Do not have throw rugs and other things on the floor that can make you trip. What can I do  in the bedroom? Use night lights. Make sure that you have a light by your bed that is easy to reach. Do not use any sheets or blankets that are too big for your bed. They should not hang down onto the floor. Have a firm chair that has side arms. You can use this for support while you get dressed. Do not have throw rugs and other things on the floor that can make you trip. What can I do in the kitchen? Clean up any spills right away. Avoid walking on wet floors. Keep items that you use a lot in easy-to-reach places. If you need to reach something above you, use a strong step stool that has a grab bar. Keep electrical cords out of the way. Do not use floor polish or wax that makes floors slippery. If you must use wax, use non-skid floor wax. Do not have throw rugs and other things on the floor that can make you trip. What can I do with my stairs? Do not leave any items on the stairs. Make sure that there are handrails on both sides of the stairs and use them. Fix handrails that are broken or loose. Make sure that handrails are as long as the stairways. Check any carpeting to make sure that it is firmly attached to the stairs. Fix any carpet that is loose or worn. Avoid having throw rugs at the top or bottom of the  stairs. If you do have throw rugs, attach them to the floor with carpet tape. Make sure that you have a light switch at the top of the stairs and the bottom of the stairs. If you do not have them, ask someone to add them for you. What else can I do to help prevent falls? Wear shoes that: Do not have high heels. Have rubber bottoms. Are comfortable and fit you well. Are closed at the toe. Do not wear sandals. If you use a stepladder: Make sure that it is fully opened. Do not climb a closed stepladder. Make sure that both sides of the stepladder are locked into place. Ask someone to hold it for you, if possible. Clearly mark and make sure that you can see: Any grab bars or  handrails. First and last steps. Where the edge of each step is. Use tools that help you move around (mobility aids) if they are needed. These include: Canes. Walkers. Scooters. Crutches. Turn on the lights when you go into a dark area. Replace any light bulbs as soon as they burn out. Set up your furniture so you have a clear path. Avoid moving your furniture around. If any of your floors are uneven, fix them. If there are any pets around you, be aware of where they are. Review your medicines with your doctor. Some medicines can make you feel dizzy. This can increase your chance of falling. Ask your doctor what other things that you can do to help prevent falls. This information is not intended to replace advice given to you by your health care provider. Make sure you discuss any questions you have with your health care provider. Document Released: 07/30/2009 Document Revised: 03/10/2016 Document Reviewed: 11/07/2014 Elsevier Interactive Patient Education  2017 [employer].

## 2022-08-22 ENCOUNTER — Ambulatory Visit (HOSPITAL_COMMUNITY)
Admission: RE | Admit: 2022-08-22 | Discharge: 2022-08-22 | Disposition: A | Discharge status: Home / Self Care | Payer: Medicare Other | Source: Ambulatory Visit | Attending: Family Medicine | Admitting: Family Medicine

## 2022-08-22 DIAGNOSIS — Z1231 Encounter for screening mammogram for malignant neoplasm of breast: Secondary | ICD-10-CM | POA: Insufficient documentation

## 2022-08-24 ENCOUNTER — Other Ambulatory Visit: Payer: Self-pay | Admitting: Family Medicine

## 2022-08-24 DIAGNOSIS — E119 Type 2 diabetes mellitus without complications: Secondary | ICD-10-CM

## 2022-08-24 DIAGNOSIS — F32A Depression, unspecified: Secondary | ICD-10-CM

## 2022-09-16 ENCOUNTER — Ambulatory Visit: Payer: Medicare Other | Admitting: Physician Assistant

## 2022-09-22 NOTE — Progress Notes (Signed)
[facility] Quality Team Note  Name: Catherine Moore Date of Birth: Mar 07, 1956 MRN: [id number] Date: 09/22/2022  [facility] Quality Team has reviewed this patient's chart, please see recommendations below:  [facility] Quality Other; KED: Kidney Health Evaluation Gap- Patient needs Urine Albumin Creatinine Ratio Test completed for gap closure. EGFR portion has already been completed.

## 2022-09-25 ENCOUNTER — Other Ambulatory Visit: Payer: Self-pay | Admitting: Family Medicine

## 2022-09-26 ENCOUNTER — Ambulatory Visit: Payer: Medicare Other | Admitting: Physician Assistant

## 2022-09-28 DIAGNOSIS — G4700 Insomnia, unspecified: Secondary | ICD-10-CM | POA: Diagnosis not present

## 2022-09-28 DIAGNOSIS — M130 Polyarthritis, unspecified: Secondary | ICD-10-CM | POA: Diagnosis not present

## 2022-09-28 DIAGNOSIS — M542 Cervicalgia: Secondary | ICD-10-CM | POA: Diagnosis not present

## 2022-10-24 ENCOUNTER — Other Ambulatory Visit: Payer: Self-pay | Admitting: Family Medicine

## 2022-10-24 DIAGNOSIS — E119 Type 2 diabetes mellitus without complications: Secondary | ICD-10-CM

## 2022-10-24 DIAGNOSIS — F32A Depression, unspecified: Secondary | ICD-10-CM

## 2022-10-27 ENCOUNTER — Ambulatory Visit (INDEPENDENT_AMBULATORY_CARE_PROVIDER_SITE_OTHER): Payer: Medicare Other | Admitting: Family Medicine

## 2022-10-27 DIAGNOSIS — K7460 Unspecified cirrhosis of liver: Secondary | ICD-10-CM | POA: Diagnosis not present

## 2022-10-27 DIAGNOSIS — K7581 Nonalcoholic steatohepatitis (NASH): Secondary | ICD-10-CM | POA: Diagnosis not present

## 2022-10-27 DIAGNOSIS — G8929 Other chronic pain: Secondary | ICD-10-CM | POA: Diagnosis not present

## 2022-10-27 DIAGNOSIS — E119 Type 2 diabetes mellitus without complications: Secondary | ICD-10-CM | POA: Diagnosis not present

## 2022-10-27 DIAGNOSIS — E7800 Pure hypercholesterolemia, unspecified: Secondary | ICD-10-CM | POA: Diagnosis not present

## 2022-10-27 NOTE — Progress Notes (Signed)
Subjective:  Patient ID: Catherine Moore, female    DOB: [birth year]  Age: 67 y.o. MRN: [id number]  CC: Chief Complaint  Patient presents with   medication check    HPI:  67 year old female with GERD, liver cirrhosis secondary to [facility] type 2 diabetes, overactive bladder, anxiety and depression, chronic pain, hyperlipidemia presents for follow-up.  Patient is overdue for A1c and urine microalbumin. Last A1C 5.8.  Has been doing well on metformin and glipizide.    Follows closely with GI regarding early cirrhosis.  Has chronic musculoskeletal pain.  Has been managed by neurology.  However, office now closed.  She would like me to take over her pain management.  She is currently on hydrocodone 10/325 3 times daily.  She also takes Cymbalta and gabapentin.  Patient also takes alprazolam for anxiety.  Patient Active Problem List   Diagnosis Date Noted   Encounter for chronic pain management 10/27/2022   OAB (overactive bladder) 11/26/2021   Liver cirrhosis secondary to NASH ([place]) 08/03/2016   Type 2 diabetes mellitus without complication, without long-term current use of insulin ([place]) 10/16/2015   GERD (gastroesophageal reflux disease) 09/27/2013   Anxiety and depression 09/27/2013   Hyperlipidemia 02/15/2013    Social Hx   Social History   Socioeconomic History   Marital status: Married    Spouse name: Not on file   Number of children: Not on file   Years of education: Not on file   Highest education level: Not on file  Occupational History   Not on file  Tobacco Use   Smoking status: Former    Packs/day: 1.50    Years: 20.00    Total pack years: 30.00    Types: Cigarettes    Start date: 05/23/1971    Quit date: 06/16/1991    Years since quitting: 31.3    Passive exposure: Past   Smokeless tobacco: Never  Vaping Use   Vaping Use: Never used  Substance and Sexual Activity   Alcohol use: No    Alcohol/week: 0.0 standard drinks of alcohol   Drug use: No   Sexual  activity: Yes    Birth control/protection: Post-menopausal, Surgical  Other Topics Concern   Not on file  Social History Narrative   Not on file   Social Determinants of Health   Financial Resource Strain: Low Risk  (08/01/2022)   Overall Financial Resource Strain (CARDIA)    Difficulty of Paying Living Expenses: Not hard at all  Food Insecurity: No Food Insecurity (08/01/2022)   Hunger Vital Sign    Worried About Running Out of Food in the Last Year: Never true    Ran Out of Food in the Last Year: Never true  Transportation Needs: No Transportation Needs (08/01/2022)   PRAPARE - Hydrologist (Medical): No    Lack of Transportation (Non-Medical): No  Physical Activity: Sufficiently Active (08/01/2022)   Exercise Vital Sign    Days of Exercise per Week: 4 days    Minutes of Exercise per Session: 60 min  Stress: No Stress Concern Present (08/01/2022)   [place]    Feeling of Stress : Not at all  Social Connections: Moderately Integrated (08/01/2022)   Social Connection and Isolation Panel [NHANES]    Frequency of Communication with Friends and Family: More than three times a week    Frequency of Social Gatherings with Friends and Family: Three times a week    Attends  Religious Services: More than 4 times per year    Active Member of Clubs or Organizations: No    Attends Archivist Meetings: Never    Marital Status: Married    Review of Systems  Constitutional: Negative.   Respiratory: Negative.    Cardiovascular: Negative.    Objective:  BP 124/62   Pulse 94   Temp 98 F (36.7 C) (Oral)   Ht '5\' 6"'$  (1.676 m)   Wt 185 lb 6.4 oz (84.1 kg)   SpO2 97%   BMI 29.92 kg/m      10/27/2022    2:45 PM 08/01/2022    3:29 PM 04/28/2022    9:03 AM  BP/Weight  Systolic BP 829  562  Diastolic BP 62  46  Wt. (Lbs) 185.4 188   BMI 29.92 kg/m2 30.34 kg/m2     Physical  Exam Vitals and nursing note reviewed.  Constitutional:      General: She is not in acute distress.    Appearance: Normal appearance.  HENT:     Head: Normocephalic and atraumatic.  Cardiovascular:     Rate and Rhythm: Normal rate and regular rhythm.  Pulmonary:     Effort: Pulmonary effort is normal.     Breath sounds: Normal breath sounds. No wheezing, rhonchi or rales.  Neurological:     Mental Status: She is alert.  Psychiatric:        Mood and Affect: Mood normal.        Behavior: Behavior normal.     Lab Results  Component Value Date   WBC 6.3 11/18/2021   HGB 12.8 11/18/2021   HCT 37.6 11/18/2021   PLT 234 11/18/2021   GLUCOSE 126 (H) 03/30/2022   CHOL 145 11/18/2021   TRIG 94 11/18/2021   HDL 49 11/18/2021   LDLCALC 78 11/18/2021   ALT 15 03/30/2022   AST 17 03/30/2022   NA 144 03/30/2022   K 4.0 03/30/2022   CL 104 03/30/2022   CREATININE 0.70 03/30/2022   BUN 10 03/30/2022   CO2 25 03/30/2022   TSH 1.800 11/18/2021   INR 1.0 04/03/2020   HGBA1C 5.8 (H) 11/18/2021     Assessment & Plan:   Problem List Items Addressed This Visit       Digestive   Liver cirrhosis secondary to NASH [facility])   Relevant Orders   CBC     Endocrine   Type 2 diabetes mellitus without complication, without long-term current use of insulin ([place])    Labs today. Continue Metformin and Glipizide.       Relevant Orders   CMP14+EGFR   Hemoglobin A1c   Microalbumin / creatinine urine ratio     Other   Encounter for chronic pain management    She will follow up with me in March when medication needs to be refilled.  I have agreed to manage. She is in agreement regarding pain contract/drug testing/etc.      Hyperlipidemia    Lipid panel today. Continue Crestor.      Relevant Orders   Lipid panel    Follow-up:  March 2024  [place]

## 2022-10-27 NOTE — Patient Instructions (Signed)
Labs today.  Follow up in March.  Take care  Dr. Lacinda Axon

## 2022-10-27 NOTE — Assessment & Plan Note (Signed)
She will follow up with me in March when medication needs to be refilled.  I have agreed to manage. She is in agreement regarding pain contract/drug testing/etc.

## 2022-10-27 NOTE — Assessment & Plan Note (Signed)
Labs today. Continue Metformin and Glipizide.

## 2022-10-27 NOTE — Assessment & Plan Note (Signed)
Lipid panel today. Continue Crestor.

## 2022-11-01 ENCOUNTER — Encounter: Payer: Self-pay | Admitting: Gastroenterology

## 2022-11-01 ENCOUNTER — Ambulatory Visit: Payer: Medicare Other | Admitting: Gastroenterology

## 2022-11-01 VITALS — Temp 97.9°F | Ht 66.0 in | Wt 183.4 lb

## 2022-11-01 DIAGNOSIS — K7581 Nonalcoholic steatohepatitis (NASH): Secondary | ICD-10-CM

## 2022-11-01 DIAGNOSIS — K7460 Unspecified cirrhosis of liver: Secondary | ICD-10-CM | POA: Diagnosis not present

## 2022-11-01 DIAGNOSIS — E7800 Pure hypercholesterolemia, unspecified: Secondary | ICD-10-CM | POA: Diagnosis not present

## 2022-11-01 DIAGNOSIS — E119 Type 2 diabetes mellitus without complications: Secondary | ICD-10-CM | POA: Diagnosis not present

## 2022-11-01 NOTE — Progress Notes (Signed)
Gastroenterology Office Note     Primary Care Physician:  Coral Spikes, DO  Primary Gastroenterologist: Dr. Gala Romney    Chief Complaint   Chief Complaint  Patient presents with   Follow-up    Follow up on NASH     History of Present Illness   Catherine Moore is a 67 y.o. female presenting today in follow-up with a history of biopsy-proven NASH with wedge pressures noting portal hypertension, possible early cirrhosis on biopsy. Colonoscopy for surveillance due to history of polyps is due in 2026. EGD July 2023: normal esophagus, gastric polyps, normal duodenum.   History of drug-induced pancreatitis last year. Declining CT with pancreatic protocol. No abdominal pain, N/V, changes in bowel habits, constipation, diarrhea, overt GI bleeding, dysphagia, unexplained weight loss, lack of appetite, unexplained weight gain.   GERD controlled on omeprazole.     Past Medical History:  Diagnosis Date   Anxiety    Arthritis    Cirrhosis ([place])    Complication of anesthesia    Depression    Diabetes ([place])    GERD (gastroesophageal reflux disease)    Hyperlipidemia    PONV (postoperative nausea and vomiting)     Past Surgical History:  Procedure Laterality Date   BIOPSY  08/16/2016   Procedure: BIOPSY;  Surgeon: Daneil Dolin, MD;  [facility];  Service: Endoscopy;;   BIOPSY  08/16/2018   Procedure: BIOPSY;  Surgeon: Daneil Dolin, MD;  [facility];  Service: Endoscopy;;  gastric   COLONOSCOPY  08/09/2010   [id number] rectum colon   COLONOSCOPY WITH PROPOFOL N/A 04/07/2020   two 4-6 mm polyps in descending colon and cecum, one 3 mm polyp in cecum. (Tubular and hyperplastic. Surveillance in 5 years).   ENDOMETRIAL ABLATION     ESOPHAGOGASTRODUODENOSCOPY N/A 08/16/2016   Dr. Gala Romney: normal esophagus, GAVE, multiple gastric polyps s/p biopsy (hyperplastic), normal duodenum    ESOPHAGOGASTRODUODENOSCOPY (EGD) WITH PROPOFOL N/A 08/16/2018   [place]k:  Multiple gastric polyps, pathology revealed fundic gland, gastric biopsies negative for H. pylori.   ESOPHAGOGASTRODUODENOSCOPY (EGD) WITH PROPOFOL N/A 04/28/2022   normal esophagus, gastric polyps, normal duodenum.   IR GENERIC HISTORICAL  06/13/2016   IR TRANSCATHETER BX 06/13/2016 [facility]   IR GENERIC HISTORICAL  06/13/2016   IR US GUIDE VASC ACCESS RIGHT 06/13/2016 [facility]   IR GENERIC HISTORICAL  06/13/2016   IR VENOGRAM HEPATIC W HEMODYNAMIC EVALUATION 06/13/2016 [facility]   POLYPECTOMY  04/07/2020   Procedure: POLYPECTOMY;  Surgeon: Daneil Dolin, MD;  [facility];  Service: Endoscopy;;    Current Outpatient Medications  Medication Sig Dispense Refill   ALPRAZolam (XANAX) 0.5 MG tablet Take 0.25-1 mg by mouth See admin instructions. Take 0.25 mg morning and in the evening and 1 mg at bedtime     Ascorbic Acid (VITAMIN C) 1000 MG tablet Take 1,000 mg by mouth daily.     celecoxib (CELEBREX) 100 MG capsule Take 100 mg by mouth 2 (two) times daily.     cetirizine (ZYRTEC) 10 MG tablet TAKE 1 TABLET BY MOUTH EVERY DAY 30 tablet 2   dicyclomine (BENTYL) 10 MG capsule Take 10 mg by mouth 4 (four) times daily as needed for spasms.     DULoxetine (CYMBALTA) 60 MG capsule TAKE 1 CAPSULE BY MOUTH EVERY DAY 30 capsule 1   estradiol (ESTRACE) 0.1 MG/GM vaginal cream Apply a pea size amount to the urethra(anterior vaginal canal) twice weekly. 42.5  g 12   fluticasone (FLONASE) 50 MCG/ACT nasal spray INSTILL 2 SPRAYS IN EACH NOSTRIL EVERY DAY 16 g 5   gabapentin (NEURONTIN) 300 MG capsule Take 600 mg by mouth at bedtime.     glipiZIDE (GLUCOTROL XL) 5 MG 24 hr tablet TAKE 1 TABLET BY MOUTH EVERY DAY WITH BREAKFAST 30 tablet 5   HYDROcodone-acetaminophen (NORCO) 10-325 MG tablet Take 1 tablet by mouth 3 (three) times daily as needed for moderate pain.     metFORMIN (GLUCOPHAGE) 500 MG tablet TAKE 1 TABLET BY MOUTH TWICE DAILY WITH A MEAL 60 tablet 2   omeprazole  (PRILOSEC) 20 MG capsule TAKE ONE CAPSULE BY MOUTH TWICE DAILY BEFORE A MEAL 60 capsule 1   rosuvastatin (CRESTOR) 10 MG tablet TAKE 1 TABLET BY MOUTH EVERY DAY 90 tablet 1   vitamin B-12 (CYANOCOBALAMIN) 1000 MCG tablet Take 1,000 mcg by mouth daily.     blood glucose meter kit and supplies Dispense based on patient and insurance preference. Use up to four times daily as directed. (FOR ICD-10 E10.9, E11.9). 1 each 0   No current facility-administered medications for this visit.    Allergies as of 11/01/2022 - Review Complete 11/01/2022  Allergen Reaction Noted   Mounjaro [tirzepatide] Diarrhea and Nausea Only 02/22/2022   Zocor [simvastatin] Nausea Only 02/01/2013   Penicillins Swelling and Rash 07/19/2010    Family History  Problem Relation Age of Onset   Diabetes Father    Hypertension Father    Heart attack Father 76       MI   Stroke Father    Osteoporosis Mother    Arthritis Sister    Arthritis Brother    Colon cancer Neg Hx    Liver disease Neg Hx     Social History   Socioeconomic History   Marital status: Married    Spouse name: Not on file   Number of children: Not on file   Years of education: Not on file   Highest education level: Not on file  Occupational History   Not on file  Tobacco Use   Smoking status: Former    Packs/day: 1.50    Years: 20.00    Total pack years: 30.00    Types: Cigarettes    Start date: 05/23/1971    Quit date: 06/16/1991    Years since quitting: 31.4    Passive exposure: Past   Smokeless tobacco: Never  Vaping Use   Vaping Use: Never used  Substance and Sexual Activity   Alcohol use: No    Alcohol/week: 0.0 standard drinks of alcohol   Drug use: No   Sexual activity: Yes    Birth control/protection: Post-menopausal, Surgical  Other Topics Concern   Not on file  Social History Narrative   Not on file   Social Determinants of Health   Financial Resource Strain: Low Risk  (08/01/2022)   Overall Financial Resource Strain  (CARDIA)    Difficulty of Paying Living Expenses: Not hard at all  Food Insecurity: No Food Insecurity (08/01/2022)   Hunger Vital Sign    Worried About Running Out of Food in the Last Year: Never true    Ran Out of Food in the Last Year: Never true  Transportation Needs: No Transportation Needs (08/01/2022)   PRAPARE - Hydrologist (Medical): No    Lack of Transportation (Non-Medical): No  Physical Activity: Sufficiently Active (08/01/2022)   Exercise Vital Sign    Days of Exercise per Week:  4 days    Minutes of Exercise per Session: 60 min  Stress: No Stress Concern Present (08/01/2022)   [place]    Feeling of Stress : Not at all  Social Connections: Moderately Integrated (08/01/2022)   Social Connection and Isolation Panel [NHANES]    Frequency of Communication with Friends and Family: More than three times a week    Frequency of Social Gatherings with Friends and Family: Three times a week    Attends Religious Services: More than 4 times per year    Active Member of Clubs or Organizations: No    Attends Archivist Meetings: Never    Marital Status: Married  Human resources officer Violence: Not At Risk (08/01/2022)   Humiliation, Afraid, Rape, and Kick questionnaire    Fear of Current or Ex-Partner: No    Emotionally Abused: No    Physically Abused: No    Sexually Abused: No     Review of Systems   Gen: Denies any fever, chills, fatigue, weight loss, lack of appetite.  CV: Denies chest pain, heart palpitations, peripheral edema, syncope.  Resp: Denies shortness of breath at rest or with exertion. Denies wheezing or cough.  GI: Denies dysphagia or odynophagia. Denies jaundice, hematemesis, fecal incontinence. GU : Denies urinary burning, urinary frequency, urinary hesitancy MS: Denies joint pain, muscle weakness, cramps, or limitation of movement.  Derm: Denies rash,  itching, dry skin Psych: Denies depression, anxiety, memory loss, and confusion Heme: Denies bruising, bleeding, and enlarged lymph nodes.   Physical Exam   Temp 97.9 F (36.6 C)   Ht '5\' 6"'$  (1.676 m)   Wt 183 lb 6.4 oz (83.2 kg)   BMI 29.60 kg/m  General:   Alert and oriented. Pleasant and cooperative. Well-nourished and well-developed.  Head:  Normocephalic and atraumatic. Eyes:  Without icterus Abdomen:  +BS, soft, non-tender and non-distended. No HSM noted. No guarding or rebound. No masses appreciated.  Rectal:  Deferred  Msk:  Symmetrical without gross deformities. Normal posture. Extremities:  Without edema. Neurologic:  Alert and  oriented x4;  grossly normal neurologically. Skin:  Intact without significant lesions or rashes. Psych:  Alert and cooperative. Normal mood and affect.   Assessment   Catherine Moore is a 67 y.o. female presenting today in follow-up with a history of biopsy-proven NASH with wedge pressures noting portal hypertension, possible early cirrhosis on biopsy.   NASH: EGD recently without evidence for varices. Will update labs today as she is already going for CMP. I have added an INR and AFP to calculate MELD. Will update US abdomen as well. She does not have thrombocytopenia, splenomegaly, and would be hesitant to label outright cirrhosis but certainly at risk for progression. We are following serially.   History of drug-induced pancreatitis last year. Declining CT with pancreatic protocol. Symptoms resolved off Mounjaro.   GERD controlled on omeprazole.    PLAN    Add INR, and AFP to labs US abdomen complete Continue PPI Return in 1 year or sooner if needed    Annitta Needs, PhD, ANP-BC [facility]enter[facility]

## 2022-11-01 NOTE — Patient Instructions (Signed)
Please have blood work done at [facility] today.   We are arranging a routine ultrasound and will likely do this yearly just to keep a close watch.   We will see you in 1 year!  I enjoyed seeing you again today! You may receive a survey regarding your visit with me, and I welcome your feedback! Thanks so much for taking the time to complete this. I look forward to seeing you again.   Annitta Needs, PhD, ANP-BC [facility]

## 2022-11-02 ENCOUNTER — Encounter (INDEPENDENT_AMBULATORY_CARE_PROVIDER_SITE_OTHER): Payer: Self-pay | Admitting: *Deleted

## 2022-11-02 LAB — MICROALBUMIN / CREATININE URINE RATIO
Creatinine, Urine: 362.1 mg/dL
Microalb/Creat Ratio: 19 mg/g creat (ref 0–29)
Microalbumin, Urine: 67.2 ug/mL

## 2022-11-02 LAB — LIPID PANEL
Chol/HDL Ratio: 3.1 ratio (ref 0.0–4.4)
Cholesterol, Total: 156 mg/dL (ref 100–199)
HDL: 51 mg/dL (ref >39)
LDL Chol Calc (NIH): 86 mg/dL (ref 0–99)
Triglycerides: 102 mg/dL (ref 0–149)
VLDL Cholesterol Cal: 19 mg/dL (ref 5–40)

## 2022-11-02 LAB — CMP14+EGFR
ALT: 16 IU/L (ref 0–32)
AST: 18 IU/L (ref 0–40)
Albumin/Globulin Ratio: 1.9 (ref 1.2–2.2)
Albumin: 5 g/dL — ABNORMAL HIGH (ref 3.9–4.9)
Alkaline Phosphatase: 69 IU/L (ref 44–121)
BUN/Creatinine Ratio: 15 (ref 12–28)
BUN: 12 mg/dL (ref 8–27)
Bilirubin Total: 0.4 mg/dL (ref 0.0–1.2)
CO2: 26 mmol/L (ref 20–29)
Calcium: 9.6 mg/dL (ref 8.7–10.3)
Chloride: 101 mmol/L (ref 96–106)
Creatinine, Ser: 0.82 mg/dL (ref 0.57–1.00)
Globulin, Total: 2.6 g/dL (ref 1.5–4.5)
Glucose: 97 mg/dL (ref 70–99)
Potassium: 3.9 mmol/L (ref 3.5–5.2)
Sodium: 143 mmol/L (ref 134–144)
Total Protein: 7.6 g/dL (ref 6.0–8.5)
eGFR: 79 mL/min/{1.73_m2} (ref >59)

## 2022-11-02 LAB — CBC
Hematocrit: 41.9 % (ref 34.0–46.6)
Hemoglobin: 14 g/dL (ref 11.1–15.9)
MCH: 28.5 pg (ref 26.6–33.0)
MCHC: 33.4 g/dL (ref 31.5–35.7)
MCV: 85 fL (ref 79–97)
Platelets: 301 10*3/uL (ref 150–450)
RBC: 4.91 x10E6/uL (ref 3.77–5.28)
RDW: 12.6 % (ref 11.7–15.4)
WBC: 8.5 10*3/uL (ref 3.4–10.8)

## 2022-11-02 LAB — AFP TUMOR MARKER: AFP, Serum, Tumor Marker: <1.8 ng/mL (ref 0.0–9.2)

## 2022-11-02 LAB — PROTIME-INR
INR: 1 (ref 0.9–1.2)
Prothrombin Time: 11 s (ref 9.1–12.0)

## 2022-11-02 LAB — HEMOGLOBIN A1C
Est. average glucose Bld gHb Est-mCnc: 128 mg/dL
Hgb A1c MFr Bld: 6.1 % — ABNORMAL HIGH (ref 4.8–5.6)

## 2022-11-10 ENCOUNTER — Ambulatory Visit (HOSPITAL_COMMUNITY): Payer: Medicare Other

## 2022-11-16 ENCOUNTER — Ambulatory Visit (HOSPITAL_COMMUNITY)
Admission: RE | Admit: 2022-11-16 | Discharge: 2022-11-16 | Disposition: A | Discharge status: Home / Self Care | Payer: Medicare Other | Source: Ambulatory Visit | Attending: Gastroenterology | Admitting: Gastroenterology

## 2022-11-16 DIAGNOSIS — K7581 Nonalcoholic steatohepatitis (NASH): Secondary | ICD-10-CM | POA: Diagnosis not present

## 2022-11-16 DIAGNOSIS — K8020 Calculus of gallbladder without cholecystitis without obstruction: Secondary | ICD-10-CM | POA: Diagnosis not present

## 2022-11-22 ENCOUNTER — Other Ambulatory Visit: Payer: Self-pay | Admitting: Family Medicine

## 2022-12-21 ENCOUNTER — Other Ambulatory Visit: Payer: Self-pay | Admitting: Family Medicine

## 2022-12-21 DIAGNOSIS — F419 Anxiety disorder, unspecified: Secondary | ICD-10-CM

## 2022-12-21 DIAGNOSIS — E119 Type 2 diabetes mellitus without complications: Secondary | ICD-10-CM

## 2022-12-26 ENCOUNTER — Encounter: Payer: Self-pay | Admitting: Family Medicine

## 2022-12-26 ENCOUNTER — Ambulatory Visit (INDEPENDENT_AMBULATORY_CARE_PROVIDER_SITE_OTHER): Payer: Medicare Other | Admitting: Family Medicine

## 2022-12-26 VITALS — BP 137/76 | HR 95 | Temp 97.3°F | Ht 66.0 in | Wt 189.0 lb

## 2022-12-26 DIAGNOSIS — F32A Depression, unspecified: Secondary | ICD-10-CM

## 2022-12-26 DIAGNOSIS — F419 Anxiety disorder, unspecified: Secondary | ICD-10-CM | POA: Diagnosis not present

## 2022-12-26 DIAGNOSIS — G8929 Other chronic pain: Secondary | ICD-10-CM

## 2022-12-26 MED ORDER — HYDROCODONE-ACETAMINOPHEN 10-325 MG PO TABS: 1.0000 | ORAL_TABLET | Freq: Three times a day (TID) | ORAL | 0 refills | Status: DC | PRN

## 2022-12-26 MED ORDER — ALPRAZOLAM 0.5 MG PO TABS: ORAL_TABLET | ORAL | 2 refills | Status: DC

## 2022-12-26 NOTE — Progress Notes (Signed)
Subjective:  Patient ID: Catherine Moore, female    DOB: 04/24/1956  Age: 67 y.o. MRN: [id number]  CC: Chief Complaint  Patient presents with   Pain Management    Refill for 90 days     HPI:  67 year old female with the below mention medical problems presents for chronic pain management.  Patient previously managed by neurology.  Has chronic MSK pain - neck, back, shoulders. Has been on chronic pain medication fo years. Is also on Xanax. Currently takes Hydrocodone 10/325 mg TID. No reported side effects.  Needs to sign pain contract today. She is amendable to this.  Patient Active Problem List   Diagnosis Date Noted   Encounter for chronic pain management 10/27/2022   OAB (overactive bladder) 11/26/2021   NASH (nonalcoholic steatohepatitis) 03/01/2017   Liver cirrhosis secondary to NASH ([place]) 08/03/2016   Type 2 diabetes mellitus without complication, without long-term current use of insulin ([place]) 10/16/2015   GERD (gastroesophageal reflux disease) 09/27/2013   Anxiety and depression 09/27/2013   Hyperlipidemia 02/15/2013    Social Hx   Social History   Socioeconomic History   Marital status: Married    Spouse name: Not on file   Number of children: Not on file   Years of education: Not on file   Highest education level: Not on file  Occupational History   Not on file  Tobacco Use   Smoking status: Former    Packs/day: 1.50    Years: 20.00    Total pack years: 30.00    Types: Cigarettes    Start date: 05/23/1971    Quit date: 06/16/1991    Years since quitting: 31.5    Passive exposure: Past   Smokeless tobacco: Never  Vaping Use   Vaping Use: Never used  Substance and Sexual Activity   Alcohol use: No    Alcohol/week: 0.0 standard drinks of alcohol   Drug use: No   Sexual activity: Yes    Birth control/protection: Post-menopausal, Surgical  Other Topics Concern   Not on file  Social History Narrative   Not on file   Social Determinants of Health    Financial Resource Strain: Low Risk  (08/01/2022)   Overall Financial Resource Strain (CARDIA)    Difficulty of Paying Living Expenses: Not hard at all  Food Insecurity: No Food Insecurity (08/01/2022)   Hunger Vital Sign    Worried About Running Out of Food in the Last Year: Never true    Ran Out of Food in the Last Year: Never true  Transportation Needs: No Transportation Needs (08/01/2022)   PRAPARE - Hydrologist (Medical): No    Lack of Transportation (Non-Medical): No  Physical Activity: Sufficiently Active (08/01/2022)   Exercise Vital Sign    Days of Exercise per Week: 4 days    Minutes of Exercise per Session: 60 min  Stress: No Stress Concern Present (08/01/2022)   [place]    Feeling of Stress : Not at all  Social Connections: Moderately Integrated (08/01/2022)   Social Connection and Isolation Panel [NHANES]    Frequency of Communication with Friends and Family: More than three times a week    Frequency of Social Gatherings with Friends and Family: Three times a week    Attends Religious Services: More than 4 times per year    Active Member of Clubs or Organizations: No    Attends Archivist Meetings: Never  Marital Status: Married    Review of Systems Per HPI  Objective:  BP 137/76   Pulse 95   Temp (!) 97.3 F (36.3 C)   Ht '5\' 6"'$  (1.676 m)   Wt 189 lb (85.7 kg)   SpO2 96%   BMI 30.51 kg/m      12/26/2022    2:24 PM 11/01/2022    2:57 PM 10/27/2022    2:45 PM  BP/Weight  Systolic BP 0000000  [id number]  Diastolic BP 76  62  Wt. (Lbs) 189 183.4 185.4  BMI 30.51 kg/m2 29.6 kg/m2 29.92 kg/m2    Physical Exam Vitals and nursing note reviewed.  Constitutional:      General: She is not in acute distress.    Appearance: Normal appearance.  HENT:     Head: Normocephalic and atraumatic.  Eyes:     General:        Right eye: No discharge.        Left  eye: No discharge.     Conjunctiva/sclera: Conjunctivae normal.  Cardiovascular:     Rate and Rhythm: Normal rate and regular rhythm.  Pulmonary:     Effort: Pulmonary effort is normal.     Breath sounds: Normal breath sounds. No wheezing, rhonchi or rales.  Neurological:     Mental Status: She is alert.  Psychiatric:        Mood and Affect: Mood normal.        Behavior: Behavior normal.    Lab Results  Component Value Date   WBC 8.5 11/01/2022   HGB 14.0 11/01/2022   HCT 41.9 11/01/2022   PLT 301 11/01/2022   GLUCOSE 97 11/01/2022   CHOL 156 11/01/2022   TRIG 102 11/01/2022   HDL 51 11/01/2022   LDLCALC 86 11/01/2022   ALT 16 11/01/2022   AST 18 11/01/2022   NA 143 11/01/2022   K 3.9 11/01/2022   CL 101 11/01/2022   CREATININE 0.82 11/01/2022   BUN 12 11/01/2022   CO2 26 11/01/2022   TSH 1.800 11/18/2021   INR 1.0 11/01/2022   HGBA1C 6.1 (H) 11/01/2022     Assessment & Plan:   Problem List Items Addressed This Visit       Other   Anxiety and depression    Stable. Alprazolam refilled today.      Relevant Medications   ALPRAZolam (XANAX) 0.5 MG tablet   Encounter for chronic pain management - Primary    Pain control is good/stable. On current medication regimen. Pain contract signed today. [place] controlled substance database reviewed.  Pain medication refilled today.       Meds ordered this encounter  Medications   HYDROcodone-acetaminophen (NORCO) 10-325 MG tablet    Sig: Take 1 tablet by mouth 3 (three) times daily as needed for moderate pain or severe pain.    Dispense:  90 tablet    Refill:  0   HYDROcodone-acetaminophen (NORCO) 10-325 MG tablet    Sig: Take 1 tablet by mouth 3 (three) times daily as needed for moderate pain or severe pain.    Dispense:  90 tablet    Refill:  0   HYDROcodone-acetaminophen (NORCO) 10-325 MG tablet    Sig: Take 1 tablet by mouth 3 (three) times daily as needed for moderate pain or severe pain.    Dispense:  90  tablet    Refill:  0   ALPRAZolam (XANAX) 0.5 MG tablet    Sig: Take 0.25 mg morning and in the evening  and 1 mg at bedtime    Dispense:  90 tablet    Refill:  2    Follow-up:  Return in about 3 months (around 03/28/2023).  [place]

## 2022-12-26 NOTE — Assessment & Plan Note (Signed)
Stable. Alprazolam refilled today.

## 2022-12-26 NOTE — Assessment & Plan Note (Signed)
Pain control is good/stable. On current medication regimen. Pain contract signed today. [place] controlled substance database reviewed.  Pain medication refilled today.

## 2022-12-26 NOTE — Patient Instructions (Signed)
Medications as prescribed.  Follow up in 3 months.  

## 2022-12-28 ENCOUNTER — Ambulatory Visit: Payer: Medicare Other | Admitting: Family Medicine

## 2023-01-19 DIAGNOSIS — H8110 Benign paroxysmal vertigo, unspecified ear: Secondary | ICD-10-CM | POA: Diagnosis not present

## 2023-01-19 DIAGNOSIS — R112 Nausea with vomiting, unspecified: Secondary | ICD-10-CM | POA: Diagnosis not present

## 2023-01-22 ENCOUNTER — Other Ambulatory Visit: Payer: Self-pay | Admitting: Family Medicine

## 2023-01-22 DIAGNOSIS — F419 Anxiety disorder, unspecified: Secondary | ICD-10-CM

## 2023-01-22 DIAGNOSIS — E119 Type 2 diabetes mellitus without complications: Secondary | ICD-10-CM

## 2023-02-20 ENCOUNTER — Other Ambulatory Visit: Payer: Self-pay | Admitting: Family Medicine

## 2023-02-20 DIAGNOSIS — F419 Anxiety disorder, unspecified: Secondary | ICD-10-CM

## 2023-02-20 DIAGNOSIS — E119 Type 2 diabetes mellitus without complications: Secondary | ICD-10-CM

## 2023-03-21 ENCOUNTER — Other Ambulatory Visit: Payer: Self-pay | Admitting: Family Medicine

## 2023-03-22 ENCOUNTER — Ambulatory Visit (INDEPENDENT_AMBULATORY_CARE_PROVIDER_SITE_OTHER): Payer: Medicare Other | Admitting: Family Medicine

## 2023-03-22 ENCOUNTER — Ambulatory Visit (HOSPITAL_COMMUNITY)
Admission: RE | Admit: 2023-03-22 | Discharge: 2023-03-22 | Disposition: A | Discharge status: Home / Self Care | Payer: Medicare Other | Source: Ambulatory Visit | Attending: Family Medicine | Admitting: Family Medicine

## 2023-03-22 VITALS — BP 123/70 | HR 83 | Temp 97.5°F | Wt 190.0 lb

## 2023-03-22 DIAGNOSIS — M5442 Lumbago with sciatica, left side: Secondary | ICD-10-CM | POA: Diagnosis not present

## 2023-03-22 DIAGNOSIS — G8929 Other chronic pain: Secondary | ICD-10-CM | POA: Insufficient documentation

## 2023-03-22 DIAGNOSIS — M545 Low back pain, unspecified: Secondary | ICD-10-CM | POA: Diagnosis not present

## 2023-03-22 DIAGNOSIS — M25462 Effusion, left knee: Secondary | ICD-10-CM | POA: Diagnosis not present

## 2023-03-22 DIAGNOSIS — M7989 Other specified soft tissue disorders: Secondary | ICD-10-CM | POA: Diagnosis not present

## 2023-03-22 DIAGNOSIS — M5136 Other intervertebral disc degeneration, lumbar region: Secondary | ICD-10-CM | POA: Diagnosis not present

## 2023-03-22 MED ORDER — HYDROCODONE-ACETAMINOPHEN 10-325 MG PO TABS: 1.0000 | ORAL_TABLET | Freq: Three times a day (TID) | ORAL | 0 refills | Status: DC | PRN

## 2023-03-22 NOTE — Patient Instructions (Signed)
Xrays at the hospital.  Medications refilled.  Follow up in 3 months.

## 2023-03-22 NOTE — Progress Notes (Signed)
Subjective:  Patient ID: Catherine Moore, female    DOB: 06/03/1956  Age: 67 y.o. MRN: [id number]  CC: Chief Complaint  Patient presents with   chronic pain management    HPI:  67 year old female with GERD, NASH and early cirrhosis, type 2 diabetes, hyperlipidemia, anxiety depression, and chronic pain presents for follow-up regarding chronic pain management.  Patient reports that her pain is stable.  She does note that recently she has been experiencing left-sided low back pain with radicular symptoms.  She is also had left knee swelling.  She has had no recent imaging.  She is compliant with her pain medication.  Low risk based on opioid risk tool.  She is in need of refill of her medication.   Patient Active Problem List   Diagnosis Date Noted   Encounter for chronic pain management 10/27/2022   OAB (overactive bladder) 11/26/2021   NASH (nonalcoholic steatohepatitis) 03/01/2017   Type 2 diabetes mellitus without complication, without long-term current use of insulin ([facility]) 10/16/2015   GERD (gastroesophageal reflux disease) 09/27/2013   Anxiety and depression 09/27/2013   Hyperlipidemia 02/15/2013    Social Hx   Social History   Socioeconomic History   Marital status: Married    Spouse name: Not on file   Number of children: Not on file   Years of education: Not on file   Highest education level: Not on file  Occupational History   Not on file  Tobacco Use   Smoking status: Former    Packs/day: 1.50    Years: 20.00    Additional pack years: 0.00    Total pack years: 30.00    Types: Cigarettes    Start date: 05/23/1971    Quit date: 06/16/1991    Years since quitting: 31.7    Passive exposure: Past   Smokeless tobacco: Never  Vaping Use   Vaping Use: Never used  Substance and Sexual Activity   Alcohol use: No    Alcohol/week: 0.0 standard drinks of alcohol   Drug use: No   Sexual activity: Yes    Birth control/protection: Post-menopausal, Surgical  Other Topics  Concern   Not on file  Social History Narrative   Not on file   Social Determinants of Health   Financial Resource Strain: Low Risk  (08/01/2022)   Overall Financial Resource Strain (CARDIA)    Difficulty of Paying Living Expenses: Not hard at all  Food Insecurity: No Food Insecurity (08/01/2022)   Hunger Vital Sign    Worried About Running Out of Food in the Last Year: Never true    Ran Out of Food in the Last Year: Never true  Transportation Needs: No Transportation Needs (08/01/2022)   PRAPARE - Administrator, Civil Service (Medical): No    Lack of Transportation (Non-Medical): No  Physical Activity: Sufficiently Active (08/01/2022)   Exercise Vital Sign    Days of Exercise per Week: 4 days    Minutes of Exercise per Session: 60 min  Stress: No Stress Concern Present (08/01/2022)   [employer] of Occupational Health - Occupational Stress Questionnaire    Feeling of Stress : Not at all  Social Connections: Moderately Integrated (08/01/2022)   Social Connection and Isolation Panel [NHANES]    Frequency of Communication with Friends and Family: More than three times a week    Frequency of Social Gatherings with Friends and Family: Three times a week    Attends Religious Services: More than 4 times per year  Active Member of Clubs or Organizations: No    Attends Banker Meetings: Never    Marital Status: Married    Review of Systems Per HPI  Objective:  BP 123/70   Pulse 83   Temp (!) 97.5 F (36.4 C)   Wt 190 lb (86.2 kg)   SpO2 95%   BMI 30.67 kg/m      03/22/2023    2:06 PM 12/26/2022    2:24 PM 11/01/2022    2:57 PM  BP/Weight  Systolic BP 123 137   Diastolic BP 70 76   Wt. (Lbs) 190 189 183.4  BMI 30.67 kg/m2 30.51 kg/m2 29.6 kg/m2    Physical Exam Constitutional:      General: She is not in acute distress.    Appearance: Normal appearance.  HENT:     Head: Normocephalic and atraumatic.  Cardiovascular:     Rate and  Rhythm: Normal rate and regular rhythm.  Pulmonary:     Effort: Pulmonary effort is normal.     Breath sounds: Normal breath sounds. No wheezing, rhonchi or rales.  Musculoskeletal:     Comments: Mild swelling the left knee.  Ligaments intact.  Neurological:     Mental Status: She is alert.  Psychiatric:        Mood and Affect: Mood normal.        Behavior: Behavior normal.     Lab Results  Component Value Date   WBC 8.5 11/01/2022   HGB 14.0 11/01/2022   HCT 41.9 11/01/2022   PLT 301 11/01/2022   GLUCOSE 97 11/01/2022   CHOL 156 11/01/2022   TRIG 102 11/01/2022   HDL 51 11/01/2022   LDLCALC 86 11/01/2022   ALT 16 11/01/2022   AST 18 11/01/2022   NA 143 11/01/2022   K 3.9 11/01/2022   CL 101 11/01/2022   CREATININE 0.82 11/01/2022   BUN 12 11/01/2022   CO2 26 11/01/2022   TSH 1.800 11/18/2021   INR 1.0 11/01/2022   HGBA1C 6.1 (H) 11/01/2022     Assessment & Plan:   Problem List Items Addressed This Visit       Other   Encounter for chronic pain management - Primary    Stable.  Doing well.  [place] controlled substance database reviewed.  Planning on urine drug screen at follow-up.  Medications refilled.      Other Visit Diagnoses     Chronic left-sided low back pain with left-sided sciatica       Relevant Medications   HYDROcodone-acetaminophen (NORCO) 10-325 MG tablet (Start on 03/28/2023)   HYDROcodone-acetaminophen (NORCO) 10-325 MG tablet (Start on 04/27/2023)   HYDROcodone-acetaminophen (NORCO) 10-325 MG tablet (Start on 05/28/2023)   Other Relevant Orders   DG Lumbar Spine Complete   Swelling of left knee joint       Relevant Orders   DG Knee Complete 4 Views Left       Meds ordered this encounter  Medications   HYDROcodone-acetaminophen (NORCO) 10-325 MG tablet    Sig: Take 1 tablet by mouth 3 (three) times daily as needed for moderate pain or severe pain.    Dispense:  90 tablet    Refill:  0   HYDROcodone-acetaminophen (NORCO)  10-325 MG tablet    Sig: Take 1 tablet by mouth 3 (three) times daily as needed for moderate pain or severe pain.    Dispense:  90 tablet    Refill:  0   HYDROcodone-acetaminophen (NORCO) 10-325 MG tablet  Sig: Take 1 tablet by mouth 3 (three) times daily as needed for moderate pain or severe pain.    Dispense:  90 tablet    Refill:  0    Follow-up:  Return in about 3 months (around 06/22/2023).  Everlene Other DO [facility]

## 2023-03-22 NOTE — Assessment & Plan Note (Signed)
Stable.  Doing well.  [place] controlled substance database reviewed.  Planning on urine drug screen at follow-up.  Medications refilled.

## 2023-03-28 ENCOUNTER — Ambulatory Visit: Payer: Medicare Other | Admitting: Family Medicine

## 2023-04-20 ENCOUNTER — Other Ambulatory Visit: Payer: Self-pay | Admitting: Family Medicine

## 2023-04-20 DIAGNOSIS — F419 Anxiety disorder, unspecified: Secondary | ICD-10-CM

## 2023-04-20 DIAGNOSIS — E119 Type 2 diabetes mellitus without complications: Secondary | ICD-10-CM

## 2023-05-21 ENCOUNTER — Other Ambulatory Visit: Payer: Self-pay | Admitting: Family Medicine

## 2023-05-24 DIAGNOSIS — E119 Type 2 diabetes mellitus without complications: Secondary | ICD-10-CM | POA: Diagnosis not present

## 2023-05-24 DIAGNOSIS — H2513 Age-related nuclear cataract, bilateral: Secondary | ICD-10-CM | POA: Diagnosis not present

## 2023-05-24 DIAGNOSIS — Z7984 Long term (current) use of oral hypoglycemic drugs: Secondary | ICD-10-CM | POA: Diagnosis not present

## 2023-05-24 LAB — HM DIABETES EYE EXAM

## 2023-06-01 ENCOUNTER — Encounter: Payer: Self-pay | Admitting: *Deleted

## 2023-06-19 ENCOUNTER — Other Ambulatory Visit: Payer: Self-pay | Admitting: Family Medicine

## 2023-06-20 ENCOUNTER — Other Ambulatory Visit: Payer: Self-pay | Admitting: Family Medicine

## 2023-06-20 DIAGNOSIS — F419 Anxiety disorder, unspecified: Secondary | ICD-10-CM

## 2023-06-20 DIAGNOSIS — F32A Depression, unspecified: Secondary | ICD-10-CM

## 2023-06-20 DIAGNOSIS — E119 Type 2 diabetes mellitus without complications: Secondary | ICD-10-CM

## 2023-06-22 ENCOUNTER — Encounter: Payer: Self-pay | Admitting: Family Medicine

## 2023-06-22 ENCOUNTER — Ambulatory Visit (INDEPENDENT_AMBULATORY_CARE_PROVIDER_SITE_OTHER): Payer: Medicare Other | Admitting: Family Medicine

## 2023-06-22 VITALS — BP 122/76 | HR 74 | Temp 98.8°F | Wt 187.4 lb

## 2023-06-22 DIAGNOSIS — G8929 Other chronic pain: Secondary | ICD-10-CM

## 2023-06-22 MED ORDER — HYDROCODONE-ACETAMINOPHEN 10-325 MG PO TABS: 1.0000 | ORAL_TABLET | Freq: Three times a day (TID) | ORAL | 0 refills | Status: DC | PRN

## 2023-06-22 NOTE — Patient Instructions (Signed)
Follow up in 3 months.  Take care  Dr. Cook  

## 2023-06-23 ENCOUNTER — Other Ambulatory Visit: Payer: Self-pay | Admitting: Family Medicine

## 2023-06-25 NOTE — Assessment & Plan Note (Signed)
Stable.  Medications refilled.  Discussed chronic NSAID use.  Advised trial off

## 2023-06-25 NOTE — Progress Notes (Signed)
Subjective:  Patient ID: Catherine Moore, female    DOB: August 25, 1956  Age: 67 y.o. MRN: [id number]  CC: Follow up   HPI:  67 year old female with GERD, NASH, type 2 diabetes, overactive bladder, hyperlipidemia, anxiety depression, and chronic pain presents for follow-up.  Patient's pain is stable.  She is functional on hydrocodone 10/325 3 times daily.  This allows her to be able to do her job which she tolerates quite well.  She states that she does have some discomfort at the end of the day as she is on her feet all day.  Patient is also on Celebrex.  Will discuss the risks associated with long-term NSAID use.  Patient Active Problem List   Diagnosis Date Noted   Encounter for chronic pain management 10/27/2022   OAB (overactive bladder) 11/26/2021   NASH (nonalcoholic steatohepatitis) 03/01/2017   Type 2 diabetes mellitus without complication, without long-term current use of insulin (HCC) 10/16/2015   GERD (gastroesophageal reflux disease) 09/27/2013   Anxiety and depression 09/27/2013   Hyperlipidemia 02/15/2013    Social Hx   Social History   Socioeconomic History   Marital status: Married    Spouse name: Not on file   Number of children: Not on file   Years of education: Not on file   Highest education level: Not on file  Occupational History   Not on file  Tobacco Use   Smoking status: Former    Current packs/day: 0.00    Average packs/day: 1.5 packs/day for 20.1 years (30.1 ttl pk-yrs)    Types: Cigarettes    Start date: 05/23/1971    Quit date: 06/16/1991    Years since quitting: 32.0    Passive exposure: Past   Smokeless tobacco: Never  Vaping Use   Vaping status: Never Used  Substance and Sexual Activity   Alcohol use: No    Alcohol/week: 0.0 standard drinks of alcohol   Drug use: No   Sexual activity: Yes    Birth control/protection: Post-menopausal, Surgical  Other Topics Concern   Not on file  Social History Narrative   Not on file   Social  Determinants of Health   Financial Resource Strain: Low Risk  (08/01/2022)   Overall Financial Resource Strain (CARDIA)    Difficulty of Paying Living Expenses: Not hard at all  Food Insecurity: No Food Insecurity (08/01/2022)   Hunger Vital Sign    Worried About Running Out of Food in the Last Year: Never true    Ran Out of Food in the Last Year: Never true  Transportation Needs: No Transportation Needs (08/01/2022)   PRAPARE - Administrator, Civil Service (Medical): No    Lack of Transportation (Non-Medical): No  Physical Activity: Sufficiently Active (08/01/2022)   Exercise Vital Sign    Days of Exercise per Week: 4 days    Minutes of Exercise per Session: 60 min  Stress: No Stress Concern Present (08/01/2022)   [employer] of Occupational Health - Occupational Stress Questionnaire    Feeling of Stress : Not at all  Social Connections: Unknown (10/04/2022)   Received from [facility], [facility]   Social Network    Social Network: Not on file    Review of Systems Per HPI  Objective:  BP 122/76   Pulse 74   Temp 98.8 F (37.1 C)   Wt 187 lb 6.4 oz (85 kg)   SpO2 96%   BMI 30.25 kg/m      06/22/2023  2:52 PM 03/22/2023    2:06 PM 12/26/2022    2:24 PM  BP/Weight  Systolic BP 122 123 137  Diastolic BP 76 70 76  Wt. (Lbs) 187.4 190 189  BMI 30.25 kg/m2 30.67 kg/m2 30.51 kg/m2    Physical Exam Vitals reviewed.  Constitutional:      General: She is not in acute distress.    Appearance: Normal appearance.  HENT:     Head: Normocephalic and atraumatic.  Eyes:     General:        Right eye: No discharge.        Left eye: No discharge.     Conjunctiva/sclera: Conjunctivae normal.  Cardiovascular:     Rate and Rhythm: Normal rate and regular rhythm.  Pulmonary:     Effort: Pulmonary effort is normal.     Breath sounds: Normal breath sounds. No wheezing, rhonchi or rales.  Neurological:     Mental Status: She is alert.  Psychiatric:         Mood and Affect: Mood normal.        Behavior: Behavior normal.     Lab Results  Component Value Date   WBC 8.5 11/01/2022   HGB 14.0 11/01/2022   HCT 41.9 11/01/2022   PLT 301 11/01/2022   GLUCOSE 97 11/01/2022   CHOL 156 11/01/2022   TRIG 102 11/01/2022   HDL 51 11/01/2022   LDLCALC 86 11/01/2022   ALT 16 11/01/2022   AST 18 11/01/2022   NA 143 11/01/2022   K 3.9 11/01/2022   CL 101 11/01/2022   CREATININE 0.82 11/01/2022   BUN 12 11/01/2022   CO2 26 11/01/2022   TSH 1.800 11/18/2021   INR 1.0 11/01/2022   HGBA1C 6.1 (H) 11/01/2022     Assessment & Plan:   Problem List Items Addressed This Visit       Other   Encounter for chronic pain management - Primary    Stable.  Medications refilled.  Discussed chronic NSAID use.  Advised trial off       Meds ordered this encounter  Medications   HYDROcodone-acetaminophen (NORCO) 10-325 MG tablet    Sig: Take 1 tablet by mouth 3 (three) times daily as needed for moderate pain or severe pain.    Dispense:  90 tablet    Refill:  0   HYDROcodone-acetaminophen (NORCO) 10-325 MG tablet    Sig: Take 1 tablet by mouth 3 (three) times daily as needed for moderate pain or severe pain.    Dispense:  90 tablet    Refill:  0   HYDROcodone-acetaminophen (NORCO) 10-325 MG tablet    Sig: Take 1 tablet by mouth 3 (three) times daily as needed for moderate pain or severe pain.    Dispense:  90 tablet    Refill:  0    Follow-up:  3 months  Mindel Friscia Adriana Simas DO [facility]

## 2023-07-19 ENCOUNTER — Other Ambulatory Visit: Payer: Self-pay | Admitting: Family Medicine

## 2023-07-19 DIAGNOSIS — E119 Type 2 diabetes mellitus without complications: Secondary | ICD-10-CM

## 2023-07-19 DIAGNOSIS — F32A Depression, unspecified: Secondary | ICD-10-CM

## 2023-07-24 ENCOUNTER — Other Ambulatory Visit: Payer: Self-pay | Admitting: Family Medicine

## 2023-07-28 ENCOUNTER — Other Ambulatory Visit: Payer: Self-pay | Admitting: Family Medicine

## 2023-08-08 DIAGNOSIS — M25552 Pain in left hip: Secondary | ICD-10-CM | POA: Diagnosis not present

## 2023-08-09 ENCOUNTER — Other Ambulatory Visit (HOSPITAL_COMMUNITY): Payer: Self-pay | Admitting: Orthopedic Surgery

## 2023-08-09 DIAGNOSIS — M25562 Pain in left knee: Secondary | ICD-10-CM

## 2023-08-17 ENCOUNTER — Other Ambulatory Visit: Payer: Self-pay | Admitting: Family Medicine

## 2023-08-18 ENCOUNTER — Ambulatory Visit (HOSPITAL_COMMUNITY)
Admission: RE | Admit: 2023-08-18 | Discharge: 2023-08-18 | Disposition: A | Discharge status: Home / Self Care | Payer: Medicare Other | Source: Ambulatory Visit | Attending: Orthopedic Surgery | Admitting: Orthopedic Surgery

## 2023-08-18 DIAGNOSIS — M25562 Pain in left knee: Secondary | ICD-10-CM | POA: Diagnosis not present

## 2023-08-18 DIAGNOSIS — M1712 Unilateral primary osteoarthritis, left knee: Secondary | ICD-10-CM | POA: Diagnosis not present

## 2023-08-18 DIAGNOSIS — M7122 Synovial cyst of popliteal space [Baker], left knee: Secondary | ICD-10-CM | POA: Diagnosis not present

## 2023-08-18 DIAGNOSIS — M67462 Ganglion, left knee: Secondary | ICD-10-CM | POA: Diagnosis not present

## 2023-09-05 DIAGNOSIS — M1712 Unilateral primary osteoarthritis, left knee: Secondary | ICD-10-CM | POA: Diagnosis not present

## 2023-09-18 ENCOUNTER — Other Ambulatory Visit: Payer: Self-pay | Admitting: Family Medicine

## 2023-09-21 ENCOUNTER — Ambulatory Visit (INDEPENDENT_AMBULATORY_CARE_PROVIDER_SITE_OTHER): Payer: Medicare Other | Admitting: Family Medicine

## 2023-09-21 ENCOUNTER — Encounter: Payer: Self-pay | Admitting: Family Medicine

## 2023-09-21 DIAGNOSIS — G8929 Other chronic pain: Secondary | ICD-10-CM | POA: Diagnosis not present

## 2023-09-21 DIAGNOSIS — Z7984 Long term (current) use of oral hypoglycemic drugs: Secondary | ICD-10-CM

## 2023-09-21 DIAGNOSIS — E119 Type 2 diabetes mellitus without complications: Secondary | ICD-10-CM

## 2023-09-21 MED ORDER — HYDROCODONE-ACETAMINOPHEN 10-325 MG PO TABS: 1.0000 | ORAL_TABLET | Freq: Three times a day (TID) | ORAL | 0 refills | Status: DC | PRN

## 2023-09-21 NOTE — Patient Instructions (Signed)
Continue your medications.  Follow up in 3 months.  

## 2023-09-22 NOTE — Assessment & Plan Note (Signed)
Stable.  Medications refilled. 

## 2023-09-22 NOTE — Progress Notes (Signed)
Subjective:  Patient ID: Catherine Moore, female    DOB: 1955-12-05  Age: 67 y.o. MRN: [id number]  CC:   Chief Complaint  Patient presents with   Pain Management    HPI:  67 year old female with the below mentioned medical problems presents for follow-up.  Type 2 diabetes has been stable on metformin and glipizide.  Patient has seen orthopedics regarding knee osteoarthritis and left hip pain.  She has had injections.  Planning for gel injections of the knee.  Patient's chronic pain is stable.  No reported side effects.  Denies chest pain or shortness of breath.  No constipation.  Patient is in need of medication refill.  Patient Active Problem List   Diagnosis Date Noted   Encounter for chronic pain management 10/27/2022   OAB (overactive bladder) 11/26/2021   NASH (nonalcoholic steatohepatitis) 03/01/2017   Type 2 diabetes mellitus without complication, without long-term current use of insulin (HCC) 10/16/2015   GERD (gastroesophageal reflux disease) 09/27/2013   Anxiety and depression 09/27/2013   Hyperlipidemia 02/15/2013    Social Hx   Social History   Socioeconomic History   Marital status: Married    Spouse name: Not on file   Number of children: Not on file   Years of education: Not on file   Highest education level: Not on file  Occupational History   Not on file  Tobacco Use   Smoking status: Former    Current packs/day: 0.00    Average packs/day: 1.5 packs/day for 20.1 years (30.1 ttl pk-yrs)    Types: Cigarettes    Start date: 05/23/1971    Quit date: 06/16/1991    Years since quitting: 32.2    Passive exposure: Past   Smokeless tobacco: Never  Vaping Use   Vaping status: Never Used  Substance and Sexual Activity   Alcohol use: No    Alcohol/week: 0.0 standard drinks of alcohol   Drug use: No   Sexual activity: Yes    Birth control/protection: Post-menopausal, Surgical  Other Topics Concern   Not on file  Social History Narrative   Not on file    Social Determinants of Health   Financial Resource Strain: Low Risk  (08/01/2022)   Overall Financial Resource Strain (CARDIA)    Difficulty of Paying Living Expenses: Not hard at all  Food Insecurity: No Food Insecurity (08/01/2022)   Hunger Vital Sign    Worried About Running Out of Food in the Last Year: Never true    Ran Out of Food in the Last Year: Never true  Transportation Needs: No Transportation Needs (08/01/2022)   PRAPARE - Administrator, Civil Service (Medical): No    Lack of Transportation (Non-Medical): No  Physical Activity: Sufficiently Active (08/01/2022)   Exercise Vital Sign    Days of Exercise per Week: 4 days    Minutes of Exercise per Session: 60 min  Stress: No Stress Concern Present (08/01/2022)   [employer] of Occupational Health - Occupational Stress Questionnaire    Feeling of Stress : Not at all  Social Connections: Unknown (10/04/2022)   Received from [facility], [facility]   Social Network    Social Network: Not on file    Review of Systems Per HPI  Objective:  BP 102/72   Pulse 81   Temp (!) 97.2 F (36.2 C)   Ht 5\' 6"  (1.676 m)   Wt 187 lb (84.8 kg)   SpO2 97%   BMI 30.18 kg/m  09/21/2023    2:59 PM 06/22/2023    2:52 PM 03/22/2023    2:06 PM  BP/Weight  Systolic BP 102 122 123  Diastolic BP 72 76 70  Wt. (Lbs) 187 187.4 190  BMI 30.18 kg/m2 30.25 kg/m2 30.67 kg/m2    Physical Exam Vitals and nursing note reviewed.  Constitutional:      General: She is not in acute distress.    Appearance: Normal appearance.  HENT:     Head: Normocephalic and atraumatic.  Eyes:     General:        Right eye: No discharge.        Left eye: No discharge.     Conjunctiva/sclera: Conjunctivae normal.  Cardiovascular:     Rate and Rhythm: Normal rate and regular rhythm.  Pulmonary:     Effort: Pulmonary effort is normal.     Breath sounds: Normal breath sounds. No wheezing, rhonchi or rales.  Neurological:      Mental Status: She is alert.  Psychiatric:        Mood and Affect: Mood normal.        Behavior: Behavior normal.     Lab Results  Component Value Date   WBC 8.5 11/01/2022   HGB 14.0 11/01/2022   HCT 41.9 11/01/2022   PLT 301 11/01/2022   GLUCOSE 97 11/01/2022   CHOL 156 11/01/2022   TRIG 102 11/01/2022   HDL 51 11/01/2022   LDLCALC 86 11/01/2022   ALT 16 11/01/2022   AST 18 11/01/2022   NA 143 11/01/2022   K 3.9 11/01/2022   CL 101 11/01/2022   CREATININE 0.82 11/01/2022   BUN 12 11/01/2022   CO2 26 11/01/2022   TSH 1.800 11/18/2021   INR 1.0 11/01/2022   HGBA1C 6.1 (H) 11/01/2022     Assessment & Plan:   Problem List Items Addressed This Visit       Endocrine   Type 2 diabetes mellitus without complication, without long-term current use of insulin (HCC)    Stable.  Continue metformin and glipizide.        Other   Encounter for chronic pain management    Stable.  Medications refilled.       Meds ordered this encounter  Medications   HYDROcodone-acetaminophen (NORCO) 10-325 MG tablet    Sig: Take 1 tablet by mouth 3 (three) times daily as needed for moderate pain (pain score 4-6) or severe pain (pain score 7-10).    Dispense:  90 tablet    Refill:  0   HYDROcodone-acetaminophen (NORCO) 10-325 MG tablet    Sig: Take 1 tablet by mouth 3 (three) times daily as needed for moderate pain (pain score 4-6) or severe pain (pain score 7-10).    Dispense:  90 tablet    Refill:  0   HYDROcodone-acetaminophen (NORCO) 10-325 MG tablet    Sig: Take 1 tablet by mouth 3 (three) times daily as needed for moderate pain (pain score 4-6) or severe pain (pain score 7-10).    Dispense:  90 tablet    Refill:  0    Follow-up:  Return in about 3 months (around 12/20/2023) for Pain management.  Everlene Other DO [facility]

## 2023-09-22 NOTE — Assessment & Plan Note (Signed)
Stable.  Continue metformin and glipizide.

## 2023-09-28 ENCOUNTER — Encounter: Payer: Self-pay | Admitting: Gastroenterology

## 2023-10-17 ENCOUNTER — Other Ambulatory Visit: Payer: Self-pay | Admitting: Nurse Practitioner

## 2023-10-17 ENCOUNTER — Other Ambulatory Visit: Payer: Self-pay | Admitting: Family Medicine

## 2023-10-17 DIAGNOSIS — F419 Anxiety disorder, unspecified: Secondary | ICD-10-CM

## 2023-10-17 DIAGNOSIS — E119 Type 2 diabetes mellitus without complications: Secondary | ICD-10-CM

## 2023-10-26 ENCOUNTER — Encounter: Payer: Self-pay | Admitting: Gastroenterology

## 2023-10-26 ENCOUNTER — Ambulatory Visit: Payer: Medicare Other | Admitting: Gastroenterology

## 2023-10-26 ENCOUNTER — Other Ambulatory Visit: Payer: Self-pay | Admitting: Nurse Practitioner

## 2023-10-27 ENCOUNTER — Encounter: Payer: Self-pay | Admitting: Internal Medicine

## 2023-10-30 DIAGNOSIS — M1712 Unilateral primary osteoarthritis, left knee: Secondary | ICD-10-CM | POA: Diagnosis not present

## 2023-11-07 DIAGNOSIS — M1712 Unilateral primary osteoarthritis, left knee: Secondary | ICD-10-CM | POA: Diagnosis not present

## 2023-11-14 DIAGNOSIS — M1712 Unilateral primary osteoarthritis, left knee: Secondary | ICD-10-CM | POA: Diagnosis not present

## 2023-11-16 ENCOUNTER — Other Ambulatory Visit: Payer: Self-pay | Admitting: Family Medicine

## 2023-11-21 ENCOUNTER — Ambulatory Visit (INDEPENDENT_AMBULATORY_CARE_PROVIDER_SITE_OTHER): Payer: Medicare Other | Admitting: Gastroenterology

## 2023-11-21 ENCOUNTER — Encounter: Payer: Self-pay | Admitting: Gastroenterology

## 2023-11-21 VITALS — BP 132/79 | HR 70 | Temp 98.6°F | Ht 66.0 in | Wt 180.6 lb

## 2023-11-21 DIAGNOSIS — N390 Urinary tract infection, site not specified: Secondary | ICD-10-CM

## 2023-11-21 DIAGNOSIS — N3000 Acute cystitis without hematuria: Secondary | ICD-10-CM

## 2023-11-21 DIAGNOSIS — K219 Gastro-esophageal reflux disease without esophagitis: Secondary | ICD-10-CM

## 2023-11-21 DIAGNOSIS — K7581 Nonalcoholic steatohepatitis (NASH): Secondary | ICD-10-CM

## 2023-11-21 MED ORDER — SULFAMETHOXAZOLE-TRIMETHOPRIM 800-160 MG PO TABS: 1.0000 | ORAL_TABLET | Freq: Two times a day (BID) | ORAL | 0 refills | Status: AC

## 2023-11-21 NOTE — Patient Instructions (Signed)
 I have sent in Bactrim  to take twice a day for 3 days. While you are on this, take only one metformin  daily, then when you are done, you can go back to regular dosing.  I have ordered a urinalysis to have just in case we need a different antibiotic.  I have also ordered labs to have done when you see Dr. Bluford.  We will arrange an ultrasound to assess liver stiffness in the near future!  We are seeing if you are a candidate to take Rezdiffra.  I would like to see you in 3 months!  I enjoyed seeing you again today! I value our relationship and want to provide genuine, compassionate, and quality care. You may receive a survey regarding your visit with me, and I welcome your feedback! Thanks so much for taking the time to complete this. I look forward to seeing you again.      Therisa MICAEL Stager, PhD, ANP-BC [facility]

## 2023-11-21 NOTE — Progress Notes (Signed)
 Gastroenterology Office Note     Primary Care Physician:  Bluford Jacqulyn MATSU, DO  Primary Gastroenterologist: Dr. Shaaron    Chief Complaint   Chief Complaint  Patient presents with   Follow-up    Follow up on NASH     History of Present Illness   Catherine Moore is a 68 y.o. female presenting today with a history of biopsy-proven NASH with wedge pressures noting portal hypertension, possible early cirrhosis  with fibrosis on biopsy.  Last seen Jan 2024.   Last US  in Jan 2024 with hepatic steatosis, normal spleen. No thrombocytopenia. NO recent labs. LFTs have been normal historically. Extensive serologies in 2017 negative autoimmune, PBC, celiac, Hep B negative, iron sats 44, ceruloplasmin normal, hemochromatosis labs with unaffected carrier. Ferritin normalized.    3 gel shots in knee recently. Hasn't been able to work out as much. Omeprazole  BID. Unable to decrease to once per day. Dicyclomine rare. No rectal bleeding. No mental status changes, confusion, jaundice, pruritus.   Urinary burning. Taking urostat. No improvement.   Colonoscopy for surveillance due to history of polyps is due in 2026. EGD July 2023: normal esophagus, gastric polyps, normal duodenum.   Past Medical History:  Diagnosis Date   Anxiety    Arthritis    Cirrhosis (HCC)    Complication of anesthesia    Depression    Diabetes (HCC)    GERD (gastroesophageal reflux disease)    Hyperlipidemia    PONV (postoperative nausea and vomiting)     Past Surgical History:  Procedure Laterality Date   BIOPSY  08/16/2016   Procedure: BIOPSY;  Surgeon: Lamar CHRISTELLA Shaaron, MD;  [facility]: [facility];  Service: Endoscopy;;   BIOPSY  08/16/2018   Procedure: BIOPSY;  Surgeon: Shaaron Lamar CHRISTELLA, MD;  Location: [facility];  Service: Endoscopy;;  gastric   COLONOSCOPY  08/09/2010   MFM:[id number] rectum colon   COLONOSCOPY WITH PROPOFOL  N/A 04/07/2020   two 4-6 mm polyps in descending colon and cecum, one 3 mm polyp in  cecum. (Tubular and hyperplastic. Surveillance in 5 years).   ENDOMETRIAL ABLATION     ESOPHAGOGASTRODUODENOSCOPY N/A 08/16/2016   Dr. Shaaron: normal esophagus, GAVE, multiple gastric polyps s/p biopsy (hyperplastic), normal duodenum    ESOPHAGOGASTRODUODENOSCOPY (EGD) WITH PROPOFOL  N/A 08/16/2018   Rourk: Multiple gastric polyps, pathology revealed fundic gland, gastric biopsies negative for H. pylori.   ESOPHAGOGASTRODUODENOSCOPY (EGD) WITH PROPOFOL  N/A 04/28/2022   normal esophagus, gastric polyps, normal duodenum.   IR GENERIC HISTORICAL  06/13/2016   IR TRANSCATHETER BX 06/13/2016 [facility]   IR GENERIC HISTORICAL  06/13/2016   IR US  GUIDE VASC ACCESS RIGHT 06/13/2016 [facility]   IR GENERIC HISTORICAL  06/13/2016   IR VENOGRAM HEPATIC W HEMODYNAMIC EVALUATION 06/13/2016 [facility]   POLYPECTOMY  04/07/2020   Procedure: POLYPECTOMY;  Surgeon: Shaaron Lamar CHRISTELLA, MD;  [facility]: [facility];  Service: Endoscopy;;    Current Outpatient Medications  Medication Sig Dispense Refill   ALPRAZolam  (XANAX ) 0.5 MG tablet TAKE 1/2 TABLET BY MOUTH IN THE MORNING & IN THE EVENING and TAKE 2 TABLETS AT BEDTIME 90 tablet 2   Ascorbic Acid (VITAMIN C) 1000 MG tablet Take 1,000 mg by mouth daily.     cetirizine  (ZYRTEC ) 10 MG tablet TAKE 1 TABLET BY MOUTH EVERY DAY 30 tablet 2   dicyclomine (BENTYL) 10 MG capsule Take 10 mg by mouth 4 (four) times daily as needed for spasms.     DULoxetine  (CYMBALTA ) 60  MG capsule TAKE 1 CAPSULE BY MOUTH EVERY DAY 90 capsule 3   estradiol  (ESTRACE ) 0.1 MG/GM vaginal cream Apply a pea size amount to the urethra(anterior vaginal canal) twice weekly. 42.5 g 12   fluticasone  (FLONASE ) 50 MCG/ACT nasal spray INSTILL 2 SPRAYS IN EACH NOSTRIL EVERY DAY 16 g 5   gabapentin (NEURONTIN) 300 MG capsule TAKE TWO CAPSULES BY MOUTH ONCE DAILY 180 capsule 4   glipiZIDE  (GLUCOTROL  XL) 5 MG 24 hr tablet TAKE 1 TABLET BY MOUTH EVERY DAY WITH BREAKFAST 30 tablet 5   [START ON  11/26/2023] HYDROcodone -acetaminophen  (NORCO) 10-325 MG tablet Take 1 tablet by mouth 3 (three) times daily as needed for moderate pain (pain score 4-6) or severe pain (pain score 7-10). 90 tablet 0   metFORMIN  (GLUCOPHAGE ) 500 MG tablet TAKE 1 TABLET BY MOUTH TWICE DAILY WITH A MEAL 60 tablet 2   omeprazole  (PRILOSEC) 20 MG capsule TAKE ONE CAPSULE BY MOUTH TWICE DAILY BEFORE A MEAL 60 capsule 1   rosuvastatin  (CRESTOR ) 10 MG tablet TAKE 1 TABLET BY MOUTH EVERY DAY 90 tablet 1   vitamin B-12 (CYANOCOBALAMIN ) 1000 MCG tablet Take 1,000 mcg by mouth daily.     blood glucose meter kit and supplies Dispense based on patient and insurance preference. Use up to four times daily as directed. (FOR ICD-10 E10.9, E11.9). (Patient not taking: Reported on 11/21/2023) 1 each 0   HYDROcodone -acetaminophen  (NORCO) 10-325 MG tablet Take 1 tablet by mouth 3 (three) times daily as needed for moderate pain (pain score 4-6) or severe pain (pain score 7-10). (Patient not taking: Reported on 11/21/2023) 90 tablet 0   HYDROcodone -acetaminophen  (NORCO) 10-325 MG tablet Take 1 tablet by mouth 3 (three) times daily as needed for moderate pain (pain score 4-6) or severe pain (pain score 7-10). (Patient not taking: Reported on 11/21/2023) 90 tablet 0   No current facility-administered medications for this visit.    Allergies as of 11/21/2023 - Review Complete 11/21/2023  Allergen Reaction Noted   Mounjaro  [tirzepatide ] Diarrhea and Nausea Only 02/22/2022   Zocor [simvastatin] Nausea Only 02/01/2013   Penicillins Swelling and Rash 07/19/2010    Family History  Problem Relation Age of Onset   Diabetes Father    Hypertension Father    Heart attack Father 46       MI   Stroke Father    Osteoporosis Mother    Arthritis Sister    Arthritis Brother    Colon cancer Neg Hx    Liver disease Neg Hx     Social History   Socioeconomic History   Marital status: Married    Spouse name: Not on file   Number of children: Not on  file   Years of education: Not on file   Highest education level: Not on file  Occupational History   Not on file  Tobacco Use   Smoking status: Former    Current packs/day: 0.00    Average packs/day: 1.5 packs/day for 20.1 years (30.1 ttl pk-yrs)    Types: Cigarettes    Start date: 05/23/1971    Quit date: 06/16/1991    Years since quitting: 32.4    Passive exposure: Past   Smokeless tobacco: Never  Vaping Use   Vaping status: Never Used  Substance and Sexual Activity   Alcohol use: No    Alcohol/week: 0.0 standard drinks of alcohol   Drug use: No   Sexual activity: Yes    Birth control/protection: Post-menopausal, Surgical  Other Topics Concern  Not on file  Social History Narrative   Not on file   Social Drivers of Health   Financial Resource Strain: Low Risk  (08/01/2022)   Overall Financial Resource Strain (CARDIA)    Difficulty of Paying Living Expenses: Not hard at all  Food Insecurity: No Food Insecurity (08/01/2022)   Hunger Vital Sign    Worried About Running Out of Food in the Last Year: Never true    Ran Out of Food in the Last Year: Never true  Transportation Needs: No Transportation Needs (08/01/2022)   PRAPARE - Administrator, Civil Service (Medical): No    Lack of Transportation (Non-Medical): No  Physical Activity: Sufficiently Active (08/01/2022)   Exercise Vital Sign    Days of Exercise per Week: 4 days    Minutes of Exercise per Session: 60 min  Stress: No Stress Concern Present (08/01/2022)   [employer] of Occupational Health - Occupational Stress Questionnaire    Feeling of Stress : Not at all  Social Connections: Unknown (10/04/2022)   Received from [facility]   Social Network    Social Network: Not on file  Intimate Partner Violence: Unknown (10/04/2022)   Received from [facility], [facility]   HITS    Physically Hurt: Not on file    Insult or Talk Down To: Not on file    Threaten Physical Harm:  Not on file    Scream or Curse: Not on file     Review of Systems   Gen: Denies any fever, chills, fatigue, weight loss, lack of appetite.  CV: Denies chest pain, heart palpitations, peripheral edema, syncope.  Resp: Denies shortness of breath at rest or with exertion. Denies wheezing or cough.  GI: Denies dysphagia or odynophagia. Denies jaundice, hematemesis, fecal incontinence. GU : Denies urinary burning, urinary frequency, urinary hesitancy MS: Denies joint pain, muscle weakness, cramps, or limitation of movement.  Derm: Denies rash, itching, dry skin Psych: Denies depression, anxiety, memory loss, and confusion Heme: Denies bruising, bleeding, and enlarged lymph nodes.   Physical Exam   BP 132/79   Pulse 70   Temp 98.6 F (37 C)   Ht 5' 6 (1.676 m)   Wt 180 lb 9.6 oz (81.9 kg)   BMI 29.15 kg/m  General:   Alert and oriented. Pleasant and cooperative. Well-nourished and well-developed.  Head:  Normocephalic and atraumatic. Eyes:  Without icterus Abdomen:  +BS, soft, non-tender and non-distended. No HSM noted. No guarding or rebound. No masses appreciated.  Rectal:  Deferred  Msk:  Symmetrical without gross deformities. Normal posture. Extremities:  Without edema. Neurologic:  Alert and  oriented x4;  grossly normal neurologically. Skin:  Intact without significant lesions or rashes. Psych:  Alert and cooperative. Normal mood and affect.   Assessment   Catherine Moore is a 68 y.o. female presenting today with a history of 68 y.o. female presenting today with a history of biopsy-proven NASH with wedge pressures noting portal hypertension, fibrosis with possible early cirrhosis on biopsy.  Last seen Jan 2024.   MASH: she has no splenomegaly, thrombocytopenia, and thorough serologies are on file to exclude other etiologies as noted above. She has had consistently normal LFTs and is working on glycemic and lipid control along with weight management. I would like to update  fibrosis scores with elastography, ELF, and fibrosure. Additional MELD labs to be ordered. She is eager to do this. Could possibly consider Rezdiffra. EGD in 2023 without varices or portal  gastropathy.  UTI: short course of Bactrim . UA requested with culture.   GERD: unable to wean down to PPI BID. Continue BID dosing and wean as tolerated to lowest effective dose.     PLAN    Abx for UTI UA with culture, MELD labs, fibrosis labs, elastography Colonoscopy due in 2026 3 month follow-up   Catherine MICAEL Stager, PhD, [facility]

## 2023-11-22 ENCOUNTER — Encounter: Payer: Self-pay | Admitting: *Deleted

## 2023-11-22 ENCOUNTER — Telehealth: Payer: Self-pay | Admitting: *Deleted

## 2023-11-22 NOTE — Telephone Encounter (Signed)
 LMOVM to return call  US  scheduled for Tuesday, 11/28/23 at [facility], arrive at 9:15 am to check in, NPO after midnight. Will send Mychart message also.

## 2023-11-23 NOTE — Telephone Encounter (Signed)
 Pt informed of US  appointment date, time, location and instructions.

## 2023-11-28 ENCOUNTER — Other Ambulatory Visit (HOSPITAL_COMMUNITY): Payer: Medicare Other

## 2023-11-29 DIAGNOSIS — N390 Urinary tract infection, site not specified: Secondary | ICD-10-CM | POA: Diagnosis not present

## 2023-11-29 DIAGNOSIS — R350 Frequency of micturition: Secondary | ICD-10-CM | POA: Diagnosis not present

## 2023-11-30 ENCOUNTER — Ambulatory Visit (HOSPITAL_COMMUNITY): Admission: RE | Admit: 2023-11-30 | Payer: Medicare Other | Source: Ambulatory Visit

## 2023-12-05 ENCOUNTER — Ambulatory Visit (HOSPITAL_COMMUNITY)
Admission: RE | Admit: 2023-12-05 | Discharge: 2023-12-05 | Disposition: A | Discharge status: Home / Self Care | Payer: Medicare Other | Source: Ambulatory Visit | Attending: Gastroenterology | Admitting: Gastroenterology

## 2023-12-05 DIAGNOSIS — K7581 Nonalcoholic steatohepatitis (NASH): Secondary | ICD-10-CM | POA: Insufficient documentation

## 2023-12-05 DIAGNOSIS — K7460 Unspecified cirrhosis of liver: Secondary | ICD-10-CM | POA: Diagnosis not present

## 2023-12-05 DIAGNOSIS — K824 Cholesterolosis of gallbladder: Secondary | ICD-10-CM | POA: Diagnosis not present

## 2023-12-08 ENCOUNTER — Ambulatory Visit: Payer: Medicare Other

## 2023-12-17 ENCOUNTER — Other Ambulatory Visit: Payer: Self-pay | Admitting: Family Medicine

## 2023-12-20 ENCOUNTER — Ambulatory Visit: Payer: Medicare Other | Admitting: Family Medicine

## 2023-12-20 VITALS — BP 119/73 | HR 86 | Temp 97.7°F | Ht 66.0 in | Wt 181.0 lb

## 2023-12-20 DIAGNOSIS — E119 Type 2 diabetes mellitus without complications: Secondary | ICD-10-CM

## 2023-12-20 DIAGNOSIS — R42 Dizziness and giddiness: Secondary | ICD-10-CM | POA: Diagnosis not present

## 2023-12-20 DIAGNOSIS — G8929 Other chronic pain: Secondary | ICD-10-CM | POA: Diagnosis not present

## 2023-12-20 DIAGNOSIS — Z0489 Encounter for examination and observation for other specified reasons: Secondary | ICD-10-CM | POA: Diagnosis not present

## 2023-12-20 DIAGNOSIS — Z79899 Other long term (current) drug therapy: Secondary | ICD-10-CM | POA: Diagnosis not present

## 2023-12-20 DIAGNOSIS — E7800 Pure hypercholesterolemia, unspecified: Secondary | ICD-10-CM | POA: Diagnosis not present

## 2023-12-20 DIAGNOSIS — K7581 Nonalcoholic steatohepatitis (NASH): Secondary | ICD-10-CM | POA: Diagnosis not present

## 2023-12-20 DIAGNOSIS — E1169 Type 2 diabetes mellitus with other specified complication: Secondary | ICD-10-CM | POA: Diagnosis not present

## 2023-12-20 MED ORDER — ONDANSETRON 4 MG PO TBDP: 4.0000 mg | ORAL_TABLET | Freq: Three times a day (TID) | ORAL | 5 refills | Status: AC | PRN

## 2023-12-20 MED ORDER — HYDROCODONE-ACETAMINOPHEN 10-325 MG PO TABS
1.0000 | ORAL_TABLET | Freq: Three times a day (TID) | ORAL | 0 refills | Status: DC | PRN
Start: 2023-12-24 — End: 2024-03-21

## 2023-12-20 MED ORDER — MECLIZINE HCL 25 MG PO TABS
25.0000 mg | ORAL_TABLET | Freq: Three times a day (TID) | ORAL | 5 refills | Status: DC | PRN
Start: 2023-12-20 — End: 2024-03-21

## 2023-12-20 NOTE — Patient Instructions (Signed)
 Medications as prescribed  Labs today.  Follow up in 3 months.  Take care  Dr. Adriana Simas

## 2023-12-21 ENCOUNTER — Encounter: Payer: Self-pay | Admitting: Family Medicine

## 2023-12-21 DIAGNOSIS — R42 Dizziness and giddiness: Secondary | ICD-10-CM | POA: Insufficient documentation

## 2023-12-21 LAB — CBC
Hematocrit: 40.5 % (ref 34.0–46.6)
Hemoglobin: 12.9 g/dL (ref 11.1–15.9)
MCH: 29.1 pg (ref 26.6–33.0)
MCHC: 31.9 g/dL (ref 31.5–35.7)
MCV: 91 fL (ref 79–97)
Platelets: 271 10*3/uL (ref 150–450)
RBC: 4.44 x10E6/uL (ref 3.77–5.28)
RDW: 12.6 % (ref 11.7–15.4)
WBC: 7.6 10*3/uL (ref 3.4–10.8)

## 2023-12-21 LAB — CMP14+EGFR
ALT: 14 IU/L (ref 0–32)
AST: 20 IU/L (ref 0–40)
Albumin: 4.8 g/dL (ref 3.9–4.9)
Alkaline Phosphatase: 52 IU/L (ref 44–121)
BUN/Creatinine Ratio: 13 (ref 12–28)
BUN: 11 mg/dL (ref 8–27)
Bilirubin Total: 0.3 mg/dL (ref 0.0–1.2)
CO2: 28 mmol/L (ref 20–29)
Calcium: 9.6 mg/dL (ref 8.7–10.3)
Chloride: 101 mmol/L (ref 96–106)
Creatinine, Ser: 0.84 mg/dL (ref 0.57–1.00)
Globulin, Total: 1.9 g/dL (ref 1.5–4.5)
Glucose: 80 mg/dL (ref 70–99)
Potassium: 4.6 mmol/L (ref 3.5–5.2)
Sodium: 143 mmol/L (ref 134–144)
Total Protein: 6.7 g/dL (ref 6.0–8.5)
eGFR: 76 mL/min/{1.73_m2} (ref >59)

## 2023-12-21 LAB — LIPID PANEL
Chol/HDL Ratio: 2.7 ratio (ref 0.0–4.4)
Cholesterol, Total: 162 mg/dL (ref 100–199)
HDL: 61 mg/dL (ref >39)
LDL Chol Calc (NIH): 86 mg/dL (ref 0–99)
Triglycerides: 78 mg/dL (ref 0–149)
VLDL Cholesterol Cal: 15 mg/dL (ref 5–40)

## 2023-12-21 LAB — HEMOGLOBIN A1C
Est. average glucose Bld gHb Est-mCnc: 105 mg/dL
Hgb A1c MFr Bld: 5.3 % (ref 4.8–5.6)

## 2023-12-21 LAB — MICROALBUMIN / CREATININE URINE RATIO
Creatinine, Urine: 209.3 mg/dL
Microalb/Creat Ratio: 8 mg/g{creat} (ref 0–29)
Microalbumin, Urine: 16.6 ug/mL

## 2023-12-21 NOTE — Assessment & Plan Note (Signed)
 Meclizine and Zofran as needed.

## 2023-12-21 NOTE — Assessment & Plan Note (Signed)
 Has been well-controlled.  Obtained A1c and A1c returned at 5.3.  Will discontinue glipizide.

## 2023-12-21 NOTE — Progress Notes (Signed)
 Subjective:  Patient ID: Catherine Moore, female    DOB: [birth year]  Age: 68 y.o. MRN: [id number]  CC:   Chief Complaint  Patient presents with   Pain Management    HPI:  68 year old female presents for follow-up.  Patient's chronic pain stable on hydrocodone 10/325 3 times daily.  She states that overall she is doing well.  No side effects of medication.  Patient reports that she has had prior bouts of vertigo and would like to have meclizine and Zofran on hand.  Patient has type 2 diabetes.  Last A1c was 6.1.  Needs labs today.  Patient is inquiring about Dexcom G7.  I advised her that I am happy to send this in but insurance is unlikely to cover due to control and the fact that she is not on insulin therapy.  Needs foot exam today.  Patient Active Problem List   Diagnosis Date Noted   Vertigo 12/21/2023   Encounter for chronic pain management 10/27/2022   OAB (overactive bladder) 11/26/2021   Metabolic dysfunction-associated steatohepatitis (MASH) 03/01/2017   Type 2 diabetes mellitus without complication, without long-term current use of insulin (HCC) 10/16/2015   GERD (gastroesophageal reflux disease) 09/27/2013   Anxiety and depression 09/27/2013   Hyperlipidemia 02/15/2013    Social Hx   Social History   Socioeconomic History   Marital status: Married    Spouse name: Not on file   Number of children: Not on file   Years of education: Not on file   Highest education level: Not on file  Occupational History   Not on file  Tobacco Use   Smoking status: Former    Current packs/day: 0.00    Average packs/day: 1.5 packs/day for 20.1 years (30.1 ttl pk-yrs)    Types: Cigarettes    Start date: 05/23/1971    Quit date: 06/16/1991    Years since quitting: 32.5    Passive exposure: Past   Smokeless tobacco: Never  Vaping Use   Vaping status: Never Used  Substance and Sexual Activity   Alcohol use: No    Alcohol/week: 0.0 standard drinks of alcohol   Drug use: No    Sexual activity: Yes    Birth control/protection: Post-menopausal, Surgical  Other Topics Concern   Not on file  Social History Narrative   Not on file   Social Drivers of Health   Financial Resource Strain: Low Risk  (08/01/2022)   Overall Financial Resource Strain (CARDIA)    Difficulty of Paying Living Expenses: Not hard at all  Food Insecurity: No Food Insecurity (08/01/2022)   Hunger Vital Sign    Worried About Running Out of Food in the Last Year: Never true    Ran Out of Food in the Last Year: Never true  Transportation Needs: No Transportation Needs (08/01/2022)   PRAPARE - Administrator, Civil Service (Medical): No    Lack of Transportation (Non-Medical): No  Physical Activity: Sufficiently Active (08/01/2022)   Exercise Vital Sign    Days of Exercise per Week: 4 days    Minutes of Exercise per Session: 60 min  Stress: No Stress Concern Present (08/01/2022)   [employer] of Occupational Health - Occupational Stress Questionnaire    Feeling of Stress : Not at all  Social Connections: Unknown (10/04/2022)   Received from [facility]   Social Network    Social Network: Not on file    Review of Systems Per HPI  Objective:  BP 119/73  Pulse 86   Temp 97.7 F (36.5 C)   Ht 5\' 6"  (1.676 m)   Wt 181 lb (82.1 kg)   SpO2 95%   BMI 29.21 kg/m      12/20/2023    2:51 PM 11/21/2023    4:13 PM 11/21/2023    4:09 PM  BP/Weight  Systolic BP 119 132 146  Diastolic BP 73 79 72  Wt. (Lbs) 181  180.6  BMI 29.21 kg/m2  29.15 kg/m2    Physical Exam Constitutional:      General: She is not in acute distress.    Appearance: Normal appearance.  Eyes:     General:        Right eye: No discharge.        Left eye: No discharge.     Conjunctiva/sclera: Conjunctivae normal.  Cardiovascular:     Rate and Rhythm: Normal rate and regular rhythm.  Pulmonary:     Effort: Pulmonary effort is normal.     Breath sounds: Normal breath sounds.   Neurological:     Mental Status: She is alert.  Psychiatric:        Mood and Affect: Mood normal.        Behavior: Behavior normal.     Lab Results  Component Value Date   WBC 7.6 12/20/2023   HGB 12.9 12/20/2023   HCT 40.5 12/20/2023   PLT 271 12/20/2023   GLUCOSE 81 12/20/2023   CHOL WILL FOLLOW 12/20/2023   TRIG WILL FOLLOW 12/20/2023   HDL 61 12/20/2023   LDLCALC 86 12/20/2023   ALT 16 12/20/2023   AST 20 12/20/2023   NA 142 12/20/2023   K 4.4 12/20/2023   CL 100 12/20/2023   CREATININE 0.78 12/20/2023   BUN 9 12/20/2023   CO2 29 12/20/2023   TSH 1.800 11/18/2021   INR 1.0 12/20/2023   HGBA1C 5.3 12/20/2023     Assessment & Plan:  Type 2 diabetes mellitus without complication, without long-term current use of insulin (HCC) Assessment & Plan: Has been well-controlled.  Obtained A1c and A1c returned at 5.3.  Will discontinue glipizide.  Orders: -     CMP14+EGFR -     Hemoglobin A1c -     Microalbumin / creatinine urine ratio  Pure hypercholesterolemia Assessment & Plan: LDL returned at 86.  Patient currently on Crestor.  Will continue Crestor.  Will discuss with patient about increasing dose to push LDL below 70.  Orders: -     Lipid panel  High risk medication use -     CBC  Encounter for chronic pain management Assessment & Plan: Stable.  Medication refilled.  Urine drug screen at follow-up.  Orders: -     HYDROcodone-Acetaminophen; Take 1 tablet by mouth 3 (three) times daily as needed for moderate pain (pain score 4-6) or severe pain (pain score 7-10).  Dispense: 90 tablet; Refill: 0  Vertigo Assessment & Plan: Meclizine and Zofran as needed.  Orders: -     Meclizine HCl; Take 1 tablet (25 mg total) by mouth 3 (three) times daily as needed for dizziness.  Dispense: 30 tablet; Refill: 5 -     Ondansetron; Take 1 tablet (4 mg total) by mouth every 8 (eight) hours as needed for nausea or vomiting.  Dispense: 20 tablet; Refill: 5    Follow-up:   3 months  Libby Goehring Adriana Simas DO [facility]

## 2023-12-21 NOTE — Assessment & Plan Note (Signed)
 Stable.  Medication refilled.  Urine drug screen at follow-up.

## 2023-12-21 NOTE — Assessment & Plan Note (Addendum)
 LDL returned at 86.  Patient currently on Crestor.  Will continue Crestor.  Will discuss with patient about increasing dose to push LDL below 70.

## 2023-12-22 ENCOUNTER — Telehealth: Payer: Self-pay | Admitting: *Deleted

## 2023-12-22 NOTE — Telephone Encounter (Signed)
 Cook, Preston G, DO     I haven't finished her note. I will send in the remainder of her pain medications and change the crestor.

## 2023-12-22 NOTE — Telephone Encounter (Signed)
 Copied from CRM [id number]. Topic: Clinical - Prescription Issue >> Dec 22, 2023  9:52 AM Shelah Lewandowsky wrote: Reason for CRM: rosuvastatin (CRESTOR) 10 MG tablet -was supposed to be changed to 20 mg and was also supposed to get 90 day for HYDROcodone-acetaminophen (NORCO) 10-325 MG tablet - please call her at work [employer]  [phone] ask for her by first and last name

## 2023-12-22 NOTE — Telephone Encounter (Signed)
 Left message to return call to notify of provider's recommendations below

## 2023-12-22 NOTE — Telephone Encounter (Signed)
 Patient notified and verbalized understanding.

## 2023-12-23 LAB — COMPREHENSIVE METABOLIC PANEL
ALT: 16 IU/L (ref 0–32)
AST: 20 IU/L (ref 0–40)
Albumin: 4.8 g/dL (ref 3.9–4.9)
Alkaline Phosphatase: 52 IU/L (ref 44–121)
BUN/Creatinine Ratio: 12 (ref 12–28)
BUN: 9 mg/dL (ref 8–27)
Bilirubin Total: 0.3 mg/dL (ref 0.0–1.2)
CO2: 29 mmol/L (ref 20–29)
Calcium: 9.9 mg/dL (ref 8.7–10.3)
Chloride: 100 mmol/L (ref 96–106)
Creatinine, Ser: 0.78 mg/dL (ref 0.57–1.00)
Globulin, Total: 2.2 g/dL (ref 1.5–4.5)
Glucose: 81 mg/dL (ref 70–99)
Potassium: 4.4 mmol/L (ref 3.5–5.2)
Sodium: 142 mmol/L (ref 134–144)
Total Protein: 7 g/dL (ref 6.0–8.5)
eGFR: 83 mL/min/{1.73_m2} (ref >59)

## 2023-12-23 LAB — NASH FIBROSURE(R) PLUS
ALPHA 2-MACROGLOBULINS, QN: 429 mg/dL — ABNORMAL HIGH (ref 110–276)
ALT (SGPT) P5P: 19 IU/L (ref 0–40)
AST (SGOT) P5P: 23 IU/L (ref 0–40)
Apolipoprotein A-1: 189 mg/dL (ref 116–209)
Bilirubin, Total: 0.2 mg/dL (ref 0.0–1.2)
Cholesterol, Total: 174 mg/dL (ref 100–199)
Fibrosis Score: 0.36 — ABNORMAL HIGH (ref 0.00–0.21)
GGT: 11 IU/L (ref 0–60)
Glucose: 84 mg/dL (ref 70–99)
Haptoglobin: 42 mg/dL (ref 37–355)
NASH Score: 0 (ref 0.00–0.25)
Steatosis Score: 0.1 (ref 0.00–0.40)
Triglycerides: 83 mg/dL (ref 0–149)

## 2023-12-23 LAB — ENHANCED LIVER FIBROSIS (ELF): ELF(TM) Score: 9.47 (ref <9.80)

## 2023-12-23 LAB — PROTIME-INR
INR: 1 (ref 0.9–1.2)
Prothrombin Time: 11.1 s (ref 9.1–12.0)

## 2023-12-23 LAB — AFP TUMOR MARKER: AFP, Serum, Tumor Marker: <1.8 ng/mL (ref 0.0–9.2)

## 2023-12-24 ENCOUNTER — Other Ambulatory Visit: Payer: Self-pay | Admitting: Family Medicine

## 2023-12-24 DIAGNOSIS — G8929 Other chronic pain: Secondary | ICD-10-CM

## 2023-12-24 MED ORDER — ROSUVASTATIN CALCIUM 20 MG PO TABS: 20.0000 mg | ORAL_TABLET | Freq: Every day | ORAL | 3 refills | Status: AC

## 2023-12-24 MED ORDER — HYDROCODONE-ACETAMINOPHEN 10-325 MG PO TABS: 1.0000 | ORAL_TABLET | Freq: Three times a day (TID) | ORAL | 0 refills | Status: DC | PRN

## 2024-01-16 ENCOUNTER — Other Ambulatory Visit: Payer: Self-pay | Admitting: Family Medicine

## 2024-01-18 ENCOUNTER — Telehealth: Payer: Self-pay | Admitting: Family Medicine

## 2024-01-18 NOTE — Telephone Encounter (Signed)
  Chief Complaint: medication question regarding next refill date on hydrocodone - acetaminophen. Symptoms: patient reports she was confused on dates of refills.  Frequency: na Pertinent Negatives: Patient denies na Disposition: [] ED /[] Urgent Care (no appt availability in office) / [] Appointment(In office/virtual)/ []  [facility]/ [x] Home Care/ [] Refused Recommended Disposition /[] [facility]/ []  Follow-up with PCP Additional Notes:   NT reviewed dates PCP ordered medication refills and patient misunderstood next refill date. After reviewing next refill date as 01/24/24 patient reports medication question has been answered when she can get next refill.     Copied from CRM [id number]. Topic: Clinical - Medication Question >> Jan 18, 2024  8:55 AM Shon Hale wrote: Reason for CRM: Patient wanting to discuss hydrocodone-acetaminophen. Patient states provider changed how often she is able to get medicine from pharmacy.   Best call back number: [phone] (work ask for patient).

## 2024-02-07 ENCOUNTER — Encounter (INDEPENDENT_AMBULATORY_CARE_PROVIDER_SITE_OTHER): Payer: Self-pay

## 2024-02-14 ENCOUNTER — Other Ambulatory Visit: Payer: Self-pay | Admitting: Family Medicine

## 2024-02-14 DIAGNOSIS — E119 Type 2 diabetes mellitus without complications: Secondary | ICD-10-CM

## 2024-02-14 DIAGNOSIS — F419 Anxiety disorder, unspecified: Secondary | ICD-10-CM

## 2024-02-27 ENCOUNTER — Ambulatory Visit: Payer: Medicare Other | Admitting: Gastroenterology

## 2024-03-17 ENCOUNTER — Other Ambulatory Visit: Payer: Self-pay | Admitting: Family Medicine

## 2024-03-21 ENCOUNTER — Ambulatory Visit: Admitting: Family Medicine

## 2024-03-21 ENCOUNTER — Encounter: Payer: Self-pay | Admitting: Family Medicine

## 2024-03-21 VITALS — BP 126/75 | HR 73 | Temp 98.3°F | Ht 66.0 in | Wt 179.0 lb

## 2024-03-21 DIAGNOSIS — G8929 Other chronic pain: Secondary | ICD-10-CM | POA: Diagnosis not present

## 2024-03-21 DIAGNOSIS — E119 Type 2 diabetes mellitus without complications: Secondary | ICD-10-CM

## 2024-03-21 MED ORDER — HYDROCODONE-ACETAMINOPHEN 10-325 MG PO TABS: 1.0000 | ORAL_TABLET | Freq: Three times a day (TID) | ORAL | 0 refills | Status: DC | PRN

## 2024-03-21 MED ORDER — HYDROCODONE-ACETAMINOPHEN 10-325 MG PO TABS
1.0000 | ORAL_TABLET | Freq: Three times a day (TID) | ORAL | 0 refills | Status: DC | PRN
Start: 2024-04-24 — End: 2024-06-25

## 2024-03-21 MED ORDER — HYDROCODONE-ACETAMINOPHEN 10-325 MG PO TABS
1.0000 | ORAL_TABLET | Freq: Three times a day (TID) | ORAL | 0 refills | Status: DC | PRN
Start: 2024-03-25 — End: 2024-06-25

## 2024-03-21 NOTE — Patient Instructions (Signed)
Medication refilled    Follow up in 3 months

## 2024-03-24 NOTE — Assessment & Plan Note (Signed)
 Stable

## 2024-03-24 NOTE — Assessment & Plan Note (Signed)
 Stable. [place] controlled substance database reviewed. Meds refilled.

## 2024-03-24 NOTE — Progress Notes (Signed)
 Subjective:  Patient ID: Catherine Moore, female    DOB: 1956-07-08  Age: 68 y.o. MRN: [id number]  CC:   Chief Complaint  Patient presents with   Follow-up    3 month f/u type 2 DM, chronic pain mgt, hypercholesterolemia, vertigo     HPI:  68 year old female presents for follow up.    Diabetes is well controlled. A1c normal. Sulfonylurea stopped after last visit. Remains on Metformin .   Pain stable. Needs refills on pain medication.   Patient Active Problem List   Diagnosis Date Noted   Vertigo 12/21/2023   Encounter for chronic pain management 10/27/2022   OAB (overactive bladder) 11/26/2021   Metabolic dysfunction-associated steatohepatitis (MASH) 03/01/2017   Type 2 diabetes mellitus without complication, without long-term current use of insulin (HCC) 10/16/2015   GERD (gastroesophageal reflux disease) 09/27/2013   Anxiety and depression 09/27/2013   Hyperlipidemia 02/15/2013    Social Hx   Social History   Socioeconomic History   Marital status: Married    Spouse name: Not on file   Number of children: Not on file   Years of education: Not on file   Highest education level: Not on file  Occupational History   Not on file  Tobacco Use   Smoking status: Former    Current packs/day: 0.00    Average packs/day: 1.5 packs/day for 20.1 years (30.1 ttl pk-yrs)    Types: Cigarettes    Start date: 05/23/1971    Quit date: 06/16/1991    Years since quitting: 32.7    Passive exposure: Past   Smokeless tobacco: Never  Vaping Use   Vaping status: Never Used  Substance and Sexual Activity   Alcohol use: No    Alcohol/week: 0.0 standard drinks of alcohol   Drug use: No   Sexual activity: Yes    Birth control/protection: Post-menopausal, Surgical  Other Topics Concern   Not on file  Social History Narrative   Not on file   Social Drivers of Health   Financial Resource Strain: Low Risk  (08/01/2022)   Overall Financial Resource Strain (CARDIA)    Difficulty of  Paying Living Expenses: Not hard at all  Food Insecurity: No Food Insecurity (08/01/2022)   Hunger Vital Sign    Worried About Running Out of Food in the Last Year: Never true    Ran Out of Food in the Last Year: Never true  Transportation Needs: No Transportation Needs (08/01/2022)   PRAPARE - Administrator, Civil Service (Medical): No    Lack of Transportation (Non-Medical): No  Physical Activity: Sufficiently Active (08/01/2022)   Exercise Vital Sign    Days of Exercise per Week: 4 days    Minutes of Exercise per Session: 60 min  Stress: No Stress Concern Present (08/01/2022)   [employer] of Occupational Health - Occupational Stress Questionnaire    Feeling of Stress : Not at all  Social Connections: Unknown (10/04/2022)   Received from [facility]   Social Network    Social Network: Not on file    Review of Systems Per HPI  Objective:  BP 126/75   Pulse 73   Temp 98.3 F (36.8 C)   Ht 5\' 6"  (1.676 m)   Wt 179 lb (81.2 kg)   SpO2 94%   BMI 28.89 kg/m      03/21/2024    2:42 PM 12/20/2023    2:51 PM 11/21/2023    4:13 PM  BP/Weight  Systolic  BP 126 119 132  Diastolic BP 75 73 79  Wt. (Lbs) 179 181   BMI 28.89 kg/m2 29.21 kg/m2     Physical Exam Vitals and nursing note reviewed.  Constitutional:      General: She is not in acute distress.    Appearance: Normal appearance.  HENT:     Head: Normocephalic and atraumatic.  Eyes:     General:        Right eye: No discharge.        Left eye: No discharge.     Conjunctiva/sclera: Conjunctivae normal.  Cardiovascular:     Rate and Rhythm: Normal rate and regular rhythm.  Pulmonary:     Effort: Pulmonary effort is normal.     Breath sounds: Normal breath sounds. No wheezing, rhonchi or rales.  Neurological:     Mental Status: She is alert.  Psychiatric:        Mood and Affect: Mood normal.        Behavior: Behavior normal.     Lab Results  Component Value Date   WBC 7.6  12/20/2023   HGB 12.9 12/20/2023   HCT 40.5 12/20/2023   PLT 271 12/20/2023   GLUCOSE 81 12/20/2023   CHOL 174 12/20/2023   TRIG 83 12/20/2023   HDL 61 12/20/2023   LDLCALC 86 12/20/2023   ALT 16 12/20/2023   AST 20 12/20/2023   NA 142 12/20/2023   K 4.4 12/20/2023   CL 100 12/20/2023   CREATININE 0.78 12/20/2023   BUN 9 12/20/2023   CO2 29 12/20/2023   TSH 1.800 11/18/2021   INR 1.0 12/20/2023   HGBA1C 5.3 12/20/2023     Assessment & Plan:  Encounter for chronic pain management Assessment & Plan: Stable. [place] controlled substance database reviewed. Meds refilled.  Orders: -     HYDROcodone -Acetaminophen ; Take 1 tablet by mouth 3 (three) times daily as needed for moderate pain (pain score 4-6) or severe pain (pain score 7-10).  Dispense: 90 tablet; Refill: 0 -     HYDROcodone -Acetaminophen ; Take 1 tablet by mouth 3 (three) times daily as needed for severe pain (pain score 7-10) or moderate pain (pain score 4-6).  Dispense: 90 tablet; Refill: 0 -     HYDROcodone -Acetaminophen ; Take 1 tablet by mouth 3 (three) times daily as needed for moderate pain (pain score 4-6) or severe pain (pain score 7-10).  Dispense: 90 tablet; Refill: 0  Type 2 diabetes mellitus without complication, without long-term current use of insulin (HCC) Assessment & Plan: Stable.     Follow-up:  3 months  Brevon Dewald Debrah Fan DO [facility]

## 2024-04-16 ENCOUNTER — Other Ambulatory Visit: Payer: Self-pay | Admitting: Family Medicine

## 2024-04-23 ENCOUNTER — Ambulatory Visit: Admitting: Gastroenterology

## 2024-05-14 ENCOUNTER — Other Ambulatory Visit: Payer: Self-pay

## 2024-05-14 ENCOUNTER — Other Ambulatory Visit: Payer: Self-pay | Admitting: Family Medicine

## 2024-05-14 DIAGNOSIS — E119 Type 2 diabetes mellitus without complications: Secondary | ICD-10-CM

## 2024-05-14 DIAGNOSIS — F419 Anxiety disorder, unspecified: Secondary | ICD-10-CM

## 2024-05-14 MED ORDER — OMEPRAZOLE 20 MG PO CPDR: 20.0000 mg | DELAYED_RELEASE_CAPSULE | Freq: Every day | ORAL | 2 refills | Status: DC

## 2024-05-14 MED ORDER — METFORMIN HCL 500 MG PO TABS: 500.0000 mg | ORAL_TABLET | Freq: Two times a day (BID) | ORAL | 2 refills | Status: DC

## 2024-05-14 MED ORDER — CETIRIZINE HCL 10 MG PO TABS: 10.0000 mg | ORAL_TABLET | Freq: Every day | ORAL | 2 refills | Status: DC

## 2024-06-06 ENCOUNTER — Ambulatory Visit: Admitting: Gastroenterology

## 2024-06-06 VITALS — BP 135/72 | HR 99 | Temp 84.0°F | Ht 66.0 in | Wt 172.8 lb

## 2024-06-06 DIAGNOSIS — K7581 Nonalcoholic steatohepatitis (NASH): Secondary | ICD-10-CM | POA: Diagnosis not present

## 2024-06-06 DIAGNOSIS — K219 Gastro-esophageal reflux disease without esophagitis: Secondary | ICD-10-CM | POA: Diagnosis not present

## 2024-06-06 DIAGNOSIS — Z860100 Personal history of colon polyps, unspecified: Secondary | ICD-10-CM

## 2024-06-06 NOTE — Progress Notes (Signed)
 Gastroenterology Office Note     Primary Care Physician:  Cook, Jayce G, DO  Primary Gastroenterologist: Dr. Shaaron    Chief Complaint   Chief Complaint  Patient presents with   Follow-up    Follow up after US , and MASH     History of Present Illness   Catherine Moore is a 68 y.o. 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 Followed serially with ultrasounds due to concern for advanced fibrosis.    Taken off glipizide . Still on metformin . Continues to work on dietary modification, exercising, risk factor modification. Weight lifts. Goes to the gym consistently. No alcohol use.   Omeprazole  20 mg BID.   Has been having some issues with constipation lately. Stopped bread. Eating better. Rare dicyclomine. Not really taking lately. No blood in stool.  Wants to hold off on meds right now for constipation. Wants to hold off on further liver evaluation to evaluate candidacy for Rezdiffra. She would like to continue diet/behavior changes, weight loss.    US  abdomen complete Feb 2025: hepatic steatosis, 0.5 cm gallbladder polyp, median kPa 7.4 but IRQ ratio elevated at 0.84 (not great data quality). Spleen normal.   ELF 9.47, NASH fibrosure F1/F2.    Extensive serologies in 2017 negative autoimmune, PBC, celiac, Hep B negative, iron sats 44, ceruloplasmin normal, hemochromatosis labs with unaffected carrier. Ferritin normalized.   2017 liver biopsy: There is moderate to severe steatosis involving the core biopsies, occupying approximately 80% of the total tissue. The steatosis is mixed micro and macro. There are foci associated with inflammatory cells, consistent with steatohepatitis. The inflammatory cells are based predominately within the portal tracts and consist mostly of plasma cells and lymphocytes. There are few scattered neutrophils. There is Mallory hyaline  deposition. A trichrome stain reveals portal fibrosis with features suggestive of early nodule/cirrhosis formation. There is some fibrosis extending within sinusoids as well. Differential diagnosis includes severe non alcoholic fatty liver disease/non alcoholic steatohepatitis (NAFLD/NASH). A PAS stain shows slightly increased PAS positivity. Iron stain is normal. (JBK:gt, 06/15/16)   Colonoscopy for surveillance due to history of polyps is due in 2026. 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   Past Medical History:  Diagnosis Date   Anxiety    Arthritis    Cirrhosis (HCC)    Complication of anesthesia    Depression    Diabetes (HCC)    GERD (gastroesophageal reflux disease)    Hyperlipidemia    PONV (postoperative nausea and vomiting)     Past Surgical History:  Procedure Laterality Date   BIOPSY  08/16/2016   Procedure: BIOPSY;  Surgeon: Lamar CHRISTELLA Shaaron, MD;  [facility];  Service: Endoscopy;;   BIOPSY  08/16/2018   Procedure: BIOPSY;  Surgeon: Shaaron Lamar CHRISTELLA, MD;  [facility];  Service: Endoscopy;;  gastric   COLONOSCOPY  08/09/2010   MFM:wnmfjo rectum colon   COLONOSCOPY WITH PROPOFOL  N/A 04/07/2020   two 4-6 mm polyps in descending colon and cecum, one 3 mm polyp in cecum. (Tubular and hyperplastic. Surveillance in 5 years).   ENDOMETRIAL ABLATION     ESOPHAGOGASTRODUODENOSCOPY N/A 08/16/2016   Dr. Shaaron: normal esophagus, GAVE, multiple gastric polyps s/p biopsy (hyperplastic), normal duodenum    ESOPHAGOGASTRODUODENOSCOPY (EGD) WITH PROPOFOL  N/A 08/16/2018   Rourk: Multiple gastric polyps, pathology revealed fundic gland, gastric biopsies negative for  H. pylori.   ESOPHAGOGASTRODUODENOSCOPY (EGD) WITH PROPOFOL  N/A 04/28/2022   normal esophagus, gastric polyps, normal duodenum.   IR GENERIC HISTORICAL  06/13/2016   IR TRANSCATHETER BX 06/13/2016 [facility]   IR GENERIC HISTORICAL  06/13/2016   IR US   GUIDE VASC ACCESS RIGHT 06/13/2016 [facility]   IR GENERIC HISTORICAL  06/13/2016   IR VENOGRAM HEPATIC W HEMODYNAMIC EVALUATION 06/13/2016 [facility]   POLYPECTOMY  04/07/2020   Procedure: POLYPECTOMY;  Surgeon: Shaaron Lamar HERO, MD;  [facility]: [facility];  Service: Endoscopy;;    Current Outpatient Medications  Medication Sig Dispense Refill   ALPRAZolam  (XANAX ) 0.5 MG tablet TAKE 1/2 TABLET BY MOUTH IN THE MORNING & IN THE EVENING and TAKE 2 TABLETS AT BEDTIME 90 tablet 2   Ascorbic Acid (VITAMIN C) 1000 MG tablet Take 1,000 mg by mouth daily.     cetirizine  (ZYRTEC ) 10 MG tablet Take 1 tablet (10 mg total) by mouth daily. 30 tablet 2   dicyclomine (BENTYL) 10 MG capsule Take 10 mg by mouth 4 (four) times daily as needed for spasms.     DULoxetine  (CYMBALTA ) 60 MG capsule TAKE 1 CAPSULE BY MOUTH EVERY DAY 90 capsule 3   estradiol  (ESTRACE ) 0.1 MG/GM vaginal cream Apply a pea size amount to the urethra(anterior vaginal canal) twice weekly. 42.5 g 12   fluticasone  (FLONASE ) 50 MCG/ACT nasal spray INSTILL 2 SPRAYS IN EACH NOSTRIL EVERY DAY 16 g 5   gabapentin (NEURONTIN) 300 MG capsule TAKE TWO CAPSULES BY MOUTH ONCE DAILY 180 capsule 4   HYDROcodone -acetaminophen  (NORCO) 10-325 MG tablet Take 1 tablet by mouth 3 (three) times daily as needed for moderate pain (pain score 4-6) or severe pain (pain score 7-10). 90 tablet 0   HYDROcodone -acetaminophen  (NORCO) 10-325 MG tablet Take 1 tablet by mouth 3 (three) times daily as needed for severe pain (pain score 7-10) or moderate pain (pain score 4-6). 90 tablet 0   HYDROcodone -acetaminophen  (NORCO) 10-325 MG tablet Take 1 tablet by mouth 3 (three) times daily as needed for moderate pain (pain score 4-6) or severe pain (pain score 7-10). 90 tablet 0   metFORMIN  (GLUCOPHAGE ) 500 MG tablet Take 1 tablet (500 mg total) by mouth 2 (two) times daily with a meal. 60 tablet 2   omeprazole  (PRILOSEC) 20 MG capsule Take 1 capsule (20 mg total) by mouth  daily. (Patient taking differently: Take 20 mg by mouth 2 (two) times daily before a meal.) 60 capsule 2   ondansetron  (ZOFRAN -ODT) 4 MG disintegrating tablet Take 1 tablet (4 mg total) by mouth every 8 (eight) hours as needed for nausea or vomiting. 20 tablet 5   rosuvastatin  (CRESTOR ) 20 MG tablet Take 1 tablet (20 mg total) by mouth daily. 90 tablet 3   vitamin B-12 (CYANOCOBALAMIN ) 1000 MCG tablet Take 1,000 mcg by mouth daily.     No current facility-administered medications for this visit.    Allergies as of 06/06/2024 - Review Complete 06/06/2024  Allergen Reaction Noted   Mounjaro  [tirzepatide ] Diarrhea and Nausea Only 02/22/2022   Zocor [simvastatin] Nausea Only 02/01/2013   Penicillins Swelling and Rash 07/19/2010    Family History  Problem Relation Age of Onset   Diabetes Father    Hypertension Father    Heart attack Father 67       MI   Stroke Father    Osteoporosis Mother    Arthritis Sister    Arthritis Brother    Colon cancer Neg Hx    Liver  disease Neg Hx     Social History   Socioeconomic History   Marital status: Married    Spouse name: Not on file   Number of children: Not on file   Years of education: Not on file   Highest education level: Not on file  Occupational History   Not on file  Tobacco Use   Smoking status: Former    Current packs/day: 0.00    Average packs/day: 1.5 packs/day for 20.1 years (30.1 ttl pk-yrs)    Types: Cigarettes    Start date: 05/23/1971    Quit date: 06/16/1991    Years since quitting: 32.9    Passive exposure: Past   Smokeless tobacco: Never  Vaping Use   Vaping status: Never Used  Substance and Sexual Activity   Alcohol use: No    Alcohol/week: 0.0 standard drinks of alcohol   Drug use: No   Sexual activity: Yes    Birth control/protection: Post-menopausal, Surgical  Other Topics Concern   Not on file  Social History Narrative   Not on file   Social Drivers of Health   Financial Resource Strain: Low Risk   (08/01/2022)   Overall Financial Resource Strain (CARDIA)    Difficulty of Paying Living Expenses: Not hard at all  Food Insecurity: No Food Insecurity (08/01/2022)   Hunger Vital Sign    Worried About Running Out of Food in the Last Year: Never true    Ran Out of Food in the Last Year: Never true  Transportation Needs: No Transportation Needs (08/01/2022)   PRAPARE - Administrator, Civil Service (Medical): No    Lack of Transportation (Non-Medical): No  Physical Activity: Sufficiently Active (08/01/2022)   Exercise Vital Sign    Days of Exercise per Week: 4 days    Minutes of Exercise per Session: 60 min  Stress: No Stress Concern Present (08/01/2022)   [employer] of Occupational Health - Occupational Stress Questionnaire    Feeling of Stress : Not at all  Social Connections: Unknown (10/04/2022)   Received [facility]   Social Network    Social Network: Not on file  Intimate Partner Violence: Unknown (10/04/2022)   Received from [facility]   HITS    Physically Hurt: Not on file    Insult or Talk Down To: Not on file    Threaten Physical Harm: Not on file    Scream or Curse: Not on file     Review of Systems   Gen: Denies any fever, chills, fatigue, weight loss, lack of appetite.  CV: Denies chest pain, heart palpitations, peripheral edema, syncope.  Resp: Denies shortness of breath at rest or with exertion. Denies wheezing or cough.  GI: Denies dysphagia or odynophagia. Denies jaundice, hematemesis, fecal incontinence. GU : Denies urinary burning, urinary frequency, urinary hesitancy MS: Denies joint pain, muscle weakness, cramps, or limitation of movement.  Derm: Denies rash, itching, dry skin Psych: Denies depression, anxiety, memory loss, and confusion Heme: Denies bruising, bleeding, and enlarged lymph nodes.   Physical Exam   BP 135/72   Pulse 99   Temp (!) 84 F (28.9 C)   Ht 5' 6 (1.676 m)   Wt 172 lb 12.8 oz (78.4 kg)   BMI  27.89 kg/m  General:   Alert and oriented. Pleasant and cooperative. Well-nourished and well-developed.  Head:  Normocephalic and atraumatic. Eyes:  Without icterus Abdomen:  +BS, soft, non-tender and non-distended. No HSM noted. No guarding or rebound. No masses  appreciated.  Rectal:  Deferred  Msk:  Symmetrical without gross deformities. Normal posture. Extremities:  Without edema. Neurologic:  Alert and  oriented x4;  grossly normal neurologically. Skin:  Intact without significant lesions or rashes. Psych:  Alert and cooperative. Normal mood and affect.   Assessment   HADLEI STITT is a 68 y.o. female presenting today with a history of 68 y.o. 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 Followed serially with ultrasounds due to concern for advanced fibrosis.   Returns today to discuss further evaluation of MASH. She has done remarkably well with diet/behavior changes, glycemic control, weight loss. Hepatic steatosis noted on recent US  and normal spleen, no thrombocytopenia. I suspect she has had improvement in fibrosis with her aggressive changes and was applauded on this. ELF 9.47, NASH fibrosure F1/F2. Prior GAVE on EGD in past but recent EGD without this finding and no varices. Discussed potential for Rezdiffra, but she would like to continue with diet and behavior modifications, which she has done with excellent results thus far .  We will continue with serial ultrasounds as she does have documentation of more advanced fibrosis in the past, along with MELD and AFP labs/ recent labs on file. Can hold off on EGD unless drop in platelets or decompensation.   Continue omeprazole  BID. She has been unable to wean to once daily.   Mild constipation: desires to avoid prescriptive agents. Can start Benefiber.   Gallbladder polyp: 0.5  cm. Serial US  already arranged due to hx of advanced fibrosis and can follow this.         PLAN    RUQ US  in near future Benefiber daily Continue excellent lifestyle changes 6 month follow-up Colonoscopy in 2026   Therisa MICAEL Stager, PhD, ANP-BC [facility]

## 2024-06-06 NOTE — Patient Instructions (Signed)
 I have ordered an ultrasound in the near future.  Start adding Benefiber 2 teaspoons daily to your water. This can help with more productive bowel movements!  We will see you in 6 months. You are doing wonderfully, and I am very proud of you!  I enjoyed seeing you again today! I value our relationship and want to provide genuine, compassionate, and quality care. You may receive a survey regarding your visit with me, and I welcome your feedback! Thanks so much for taking the time to complete this. I look forward to seeing you again.      Therisa MICAEL Stager, PhD, ANP-BC [facility]

## 2024-06-07 ENCOUNTER — Telehealth: Payer: Self-pay | Admitting: *Deleted

## 2024-06-07 NOTE — Telephone Encounter (Signed)
 LMOVM to call back to give US  appt 9/4, arrival 8:15am, npo midnight

## 2024-06-10 NOTE — Telephone Encounter (Signed)
 Pt states she could not do 9/4 for US . Pt was given number to CS to call and reschedule so she could let them know what date would be better for her.

## 2024-06-14 ENCOUNTER — Other Ambulatory Visit: Payer: Self-pay | Admitting: Family Medicine

## 2024-06-14 DIAGNOSIS — F419 Anxiety disorder, unspecified: Secondary | ICD-10-CM

## 2024-06-14 DIAGNOSIS — E119 Type 2 diabetes mellitus without complications: Secondary | ICD-10-CM

## 2024-06-20 ENCOUNTER — Ambulatory Visit (HOSPITAL_COMMUNITY)

## 2024-06-25 ENCOUNTER — Ambulatory Visit (INDEPENDENT_AMBULATORY_CARE_PROVIDER_SITE_OTHER): Admitting: Family Medicine

## 2024-06-25 ENCOUNTER — Other Ambulatory Visit: Payer: Self-pay | Admitting: Family Medicine

## 2024-06-25 VITALS — BP 120/77 | HR 78 | Ht 66.0 in | Wt 173.0 lb

## 2024-06-25 DIAGNOSIS — G8929 Other chronic pain: Secondary | ICD-10-CM

## 2024-06-25 DIAGNOSIS — E119 Type 2 diabetes mellitus without complications: Secondary | ICD-10-CM

## 2024-06-25 MED ORDER — HYDROCODONE-ACETAMINOPHEN 10-325 MG PO TABS: 1.0000 | ORAL_TABLET | Freq: Three times a day (TID) | ORAL | 0 refills | Status: DC | PRN

## 2024-06-25 NOTE — Patient Instructions (Signed)
 Decrease Gabapentin to 300 mg at night for 2 weeks then discontinue.  Medications refilled.  Follow up in 3 months.

## 2024-06-27 NOTE — Progress Notes (Signed)
 Subjective:  Patient ID: Catherine Moore, female    DOB: [birth year]  Age: 68 y.o. MRN: [id number]  CC:   Chief Complaint  Patient presents with   Medical Management of Chronic Issues    Concerned about gabapentin side effects, denies any side effects    HPI:  68 year old female presents for follow up.   Patient states that she is doing well.   She is on Gabapentin and would like to discuss discontinuation given recent reports of increase risk of dementia.  Pain is stable. Needs refill on her pain medications. No adverse effects.    Patient Active Problem List   Diagnosis Date Noted   Vertigo 12/21/2023   Encounter for chronic pain management 10/27/2022   OAB (overactive bladder) 11/26/2021   Metabolic dysfunction-associated steatohepatitis (MASH) 03/01/2017   Primary osteoarthritis of first carpometacarpal joint of left hand 10/22/2015   Type 2 diabetes mellitus without complication, without long-term current use of insulin (HCC) 10/16/2015   Arthritis of knee, left 01/20/2015   GERD (gastroesophageal reflux disease) 09/27/2013   Anxiety and depression 09/27/2013   Hyperlipidemia 02/15/2013    Social Hx   Social History   Socioeconomic History   Marital status: Married    Spouse name: Not on file   Number of children: Not on file   Years of education: Not on file   Highest education level: Not on file  Occupational History   Not on file  Tobacco Use   Smoking status: Former    Current packs/day: 0.00    Average packs/day: 1.5 packs/day for 20.1 years (30.1 ttl pk-yrs)    Types: Cigarettes    Start date: 05/23/1971    Quit date: 06/16/1991    Years since quitting: 33.0    Passive exposure: Past   Smokeless tobacco: Never  Vaping Use   Vaping status: Never Used  Substance and Sexual Activity   Alcohol use: No    Alcohol/week: 0.0 standard drinks of alcohol   Drug use: No   Sexual activity: Yes    Birth control/protection: Post-menopausal, Surgical  Other  Topics Concern   Not on file  Social History Narrative   Not on file   Social Drivers of Health   Financial Resource Strain: Low Risk  (08/01/2022)   Overall Financial Resource Strain (CARDIA)    Difficulty of Paying Living Expenses: Not hard at all  Food Insecurity: No Food Insecurity (08/01/2022)   Hunger Vital Sign    Worried About Running Out of Food in the Last Year: Never true    Ran Out of Food in the Last Year: Never true  Transportation Needs: No Transportation Needs (08/01/2022)   PRAPARE - Administrator, Civil Service (Medical): No    Lack of Transportation (Non-Medical): No  Physical Activity: Sufficiently Active (08/01/2022)   Exercise Vital Sign    Days of Exercise per Week: 4 days    Minutes of Exercise per Session: 60 min  Stress: No Stress Concern Present (08/01/2022)   [employer] of Occupational Health - Occupational Stress Questionnaire    Feeling of Stress : Not at all  Social Connections: Unknown (10/04/2022)   Received from [facility]   Social Network    Social Network: Not on file    Review of Systems Per HPI  Objective:  BP 120/77   Pulse 78   Ht 5' 6 (1.676 m)   Wt 173 lb (78.5 kg)   SpO2 96%   BMI 27.92  kg/m      06/25/2024    3:01 PM 06/06/2024    2:56 PM 03/21/2024    2:42 PM  BP/Weight  Systolic BP 120 135 126  Diastolic BP 77 72 75  Wt. (Lbs) 173 172.8 179  BMI 27.92 kg/m2 27.89 kg/m2 28.89 kg/m2    Physical Exam Vitals and nursing note reviewed.  Constitutional:      General: She is not in acute distress.    Appearance: Normal appearance.  HENT:     Head: Normocephalic and atraumatic.  Eyes:     General:        Right eye: No discharge.        Left eye: No discharge.     Conjunctiva/sclera: Conjunctivae normal.  Cardiovascular:     Rate and Rhythm: Normal rate and regular rhythm.  Pulmonary:     Effort: Pulmonary effort is normal.     Breath sounds: Normal breath sounds. No wheezing, rhonchi or  rales.  Neurological:     Mental Status: She is alert.  Psychiatric:        Mood and Affect: Mood normal.        Behavior: Behavior normal.     Lab Results  Component Value Date   WBC 7.6 12/20/2023   HGB 12.9 12/20/2023   HCT 40.5 12/20/2023   PLT 271 12/20/2023   GLUCOSE 81 12/20/2023   CHOL 174 12/20/2023   TRIG 83 12/20/2023   HDL 61 12/20/2023   LDLCALC 86 12/20/2023   ALT 16 12/20/2023   AST 20 12/20/2023   NA 142 12/20/2023   K 4.4 12/20/2023   CL 100 12/20/2023   CREATININE 0.78 12/20/2023   BUN 9 12/20/2023   CO2 29 12/20/2023   TSH 1.800 11/18/2021   INR 1.0 12/20/2023   HGBA1C 5.3 12/20/2023     Assessment & Plan:  Encounter for chronic pain management Assessment & Plan: Stable. Tapering off Gabapentin. Refills sent in of Hydrocodone . [place] controlled substance database reviewed.  Orders: -     HYDROcodone -Acetaminophen ; Take 1 tablet by mouth 3 (three) times daily as needed for moderate pain (pain score 4-6) or severe pain (pain score 7-10).  Dispense: 90 tablet; Refill: 0 -     HYDROcodone -Acetaminophen ; Take 1 tablet by mouth 3 (three) times daily as needed for severe pain (pain score 7-10) or moderate pain (pain score 4-6).  Dispense: 90 tablet; Refill: 0 -     HYDROcodone -Acetaminophen ; Take 1 tablet by mouth 3 (three) times daily as needed for moderate pain (pain score 4-6) or severe pain (pain score 7-10).  Dispense: 90 tablet; Refill: 0  Type 2 diabetes mellitus without complication, without long-term current use of insulin (HCC) -     Hemoglobin A1c    Follow-up:  3 months.  Jacqulyn Ahle DO [facility]

## 2024-06-27 NOTE — Assessment & Plan Note (Signed)
 Stable. Tapering off Gabapentin. Refills sent in of Hydrocodone . [place] controlled substance database reviewed.

## 2024-07-08 DIAGNOSIS — M1712 Unilateral primary osteoarthritis, left knee: Secondary | ICD-10-CM | POA: Diagnosis not present

## 2024-07-15 ENCOUNTER — Other Ambulatory Visit: Payer: Self-pay | Admitting: Family Medicine

## 2024-07-15 DIAGNOSIS — M1712 Unilateral primary osteoarthritis, left knee: Secondary | ICD-10-CM | POA: Diagnosis not present

## 2024-07-19 ENCOUNTER — Ambulatory Visit (HOSPITAL_COMMUNITY)

## 2024-07-22 ENCOUNTER — Ambulatory Visit (HOSPITAL_COMMUNITY)
Admission: RE | Admit: 2024-07-22 | Discharge: 2024-07-22 | Disposition: A | Discharge status: Home / Self Care | Source: Ambulatory Visit | Attending: Gastroenterology | Admitting: Gastroenterology

## 2024-07-22 DIAGNOSIS — K7581 Nonalcoholic steatohepatitis (NASH): Secondary | ICD-10-CM | POA: Diagnosis not present

## 2024-07-22 DIAGNOSIS — K824 Cholesterolosis of gallbladder: Secondary | ICD-10-CM | POA: Diagnosis not present

## 2024-07-22 DIAGNOSIS — E119 Type 2 diabetes mellitus without complications: Secondary | ICD-10-CM | POA: Diagnosis not present

## 2024-07-22 DIAGNOSIS — M1712 Unilateral primary osteoarthritis, left knee: Secondary | ICD-10-CM | POA: Diagnosis not present

## 2024-07-22 DIAGNOSIS — K7689 Other specified diseases of liver: Secondary | ICD-10-CM | POA: Diagnosis not present

## 2024-07-23 ENCOUNTER — Ambulatory Visit: Payer: Self-pay | Admitting: Gastroenterology

## 2024-07-23 LAB — HEMOGLOBIN A1C
Est. average glucose Bld gHb Est-mCnc: 148 mg/dL
Hgb A1c MFr Bld: 6.8 % — ABNORMAL HIGH (ref 4.8–5.6)

## 2024-08-03 DIAGNOSIS — H2513 Age-related nuclear cataract, bilateral: Secondary | ICD-10-CM | POA: Diagnosis not present

## 2024-08-03 DIAGNOSIS — H40033 Anatomical narrow angle, bilateral: Secondary | ICD-10-CM | POA: Diagnosis not present

## 2024-08-14 ENCOUNTER — Other Ambulatory Visit: Payer: Self-pay | Admitting: Family Medicine

## 2024-08-14 DIAGNOSIS — F419 Anxiety disorder, unspecified: Secondary | ICD-10-CM

## 2024-08-14 DIAGNOSIS — E119 Type 2 diabetes mellitus without complications: Secondary | ICD-10-CM

## 2024-08-23 ENCOUNTER — Other Ambulatory Visit: Payer: Self-pay | Admitting: Family Medicine

## 2024-08-23 DIAGNOSIS — G8929 Other chronic pain: Secondary | ICD-10-CM

## 2024-09-23 ENCOUNTER — Other Ambulatory Visit: Payer: Self-pay | Admitting: Nurse Practitioner

## 2024-09-23 DIAGNOSIS — G8929 Other chronic pain: Secondary | ICD-10-CM

## 2024-09-24 ENCOUNTER — Ambulatory Visit: Admitting: Family Medicine

## 2024-09-24 ENCOUNTER — Encounter: Payer: Self-pay | Admitting: Family Medicine

## 2024-09-24 DIAGNOSIS — G8929 Other chronic pain: Secondary | ICD-10-CM

## 2024-09-24 MED ORDER — HYDROCODONE-ACETAMINOPHEN 10-325 MG PO TABS: 1.0000 | ORAL_TABLET | Freq: Three times a day (TID) | ORAL | 0 refills | Status: AC | PRN

## 2024-09-24 MED ORDER — OXYBUTYNIN CHLORIDE ER 5 MG PO TB24: 5.0000 mg | ORAL_TABLET | Freq: Every day | ORAL | 1 refills | Status: DC

## 2024-09-24 NOTE — Patient Instructions (Addendum)
 Consider Vesicare or Gemtesa.  Continue Metformin . A1c has risen.  Medication sent in.  Follow up in 3 months.

## 2024-09-25 NOTE — Assessment & Plan Note (Signed)
Stable.  Continue metformin. 

## 2024-09-25 NOTE — Assessment & Plan Note (Signed)
 Uncontrolled.  Starting oxybutynin .

## 2024-09-25 NOTE — Progress Notes (Signed)
 Subjective:  Patient ID: Catherine Moore, female    DOB: Feb 16, 1956  Age: 68 y.o. MRN: [id number]  CC:   Chief Complaint  Patient presents with   trouble with emptying bladder    Feeling of urinating, unable to empty bladder Has been to urologist before and advised exercises    HPI:  68 year old female presents for follow-up.  Diabetes has been stable.  A1c did recently increase.  We previously discussed discontinuing metformin  but in light of A1c increased to 6.8 we will continue metformin .  Chronic pain is stable.  Needs medication refills.  Will need urine drug screen in the near future.  Patient has issues with overactive bladder.  She states that she has recently had urinary urgency and some incontinence.  She has previously seen urology.  She states that she was prescribed Myrbetriq  but it was too expensive.  She has reached out to her pharmacist and they recommended oxybutynin .  Will discuss today.  Patient Active Problem List   Diagnosis Date Noted   Vertigo 12/21/2023   Encounter for chronic pain management 10/27/2022   OAB (overactive bladder) 11/26/2021   Metabolic dysfunction-associated steatohepatitis (MASH) 03/01/2017   Primary osteoarthritis of first carpometacarpal joint of left hand 10/22/2015   Type 2 diabetes mellitus without complication, without long-term current use of insulin (HCC) 10/16/2015   Arthritis of knee, left 01/20/2015   GERD (gastroesophageal reflux disease) 09/27/2013   Anxiety and depression 09/27/2013   Hyperlipidemia 02/15/2013    Social Hx   Social History   Socioeconomic History   Marital status: Married    Spouse name: Not on file   Number of children: Not on file   Years of education: Not on file   Highest education level: Not on file  Occupational History   Not on file  Tobacco Use   Smoking status: Former    Current packs/day: 0.00    Average packs/day: 1.5 packs/day for 20.1 years (30.1 ttl pk-yrs)    Types: Cigarettes     Start date: 05/23/1971    Quit date: 06/16/1991    Years since quitting: 33.3    Passive exposure: Past   Smokeless tobacco: Never  Vaping Use   Vaping status: Never Used  Substance and Sexual Activity   Alcohol use: No    Alcohol/week: 0.0 standard drinks of alcohol   Drug use: No   Sexual activity: Yes    Birth control/protection: Post-menopausal, Surgical  Other Topics Concern   Not on file  Social History Narrative   Not on file   Social Drivers of Health   Financial Resource Strain: Low Risk  (08/01/2022)   Overall Financial Resource Strain (CARDIA)    Difficulty of Paying Living Expenses: Not hard at all  Food Insecurity: No Food Insecurity (08/01/2022)   Hunger Vital Sign    Worried About Running Out of Food in the Last Year: Never true    Ran Out of Food in the Last Year: Never true  Transportation Needs: No Transportation Needs (08/01/2022)   PRAPARE - Administrator, Civil Service (Medical): No    Lack of Transportation (Non-Medical): No  Physical Activity: Sufficiently Active (08/01/2022)   Exercise Vital Sign    Days of Exercise per Week: 4 days    Minutes of Exercise per Session: 60 min  Stress: No Stress Concern Present (08/01/2022)   [employer] - Occupational Stress Questionnaire    Feeling of Stress : Not at all  Social  Connections: Unknown (10/04/2022)   Received from [facility]   Social Network    Social Network: Not on file    Review of Systems Per HPI  Objective:  BP 132/75   Pulse 84   Temp (!) 97.3 F (36.3 C)   Ht 5' 6 (1.676 m)   Wt 168 lb (76.2 kg)   SpO2 97%   BMI 27.12 kg/m      09/24/2024    3:22 PM 06/25/2024    3:01 PM 06/06/2024    2:56 PM  BP/Weight  Systolic BP 132 120 135  Diastolic BP 75 77 72  Wt. (Lbs) 168 173 172.8  BMI 27.12 kg/m2 27.92 kg/m2 27.89 kg/m2    Physical Exam Vitals and nursing note reviewed.  Constitutional:      General: She is not in acute distress.     Appearance: Normal appearance.  HENT:     Head: Normocephalic and atraumatic.  Cardiovascular:     Rate and Rhythm: Normal rate and regular rhythm.  Pulmonary:     Effort: Pulmonary effort is normal.     Breath sounds: Normal breath sounds.  Neurological:     Mental Status: She is alert.  Psychiatric:        Mood and Affect: Mood normal.        Behavior: Behavior normal.     Lab Results  Component Value Date   WBC 7.6 12/20/2023   HGB 12.9 12/20/2023   HCT 40.5 12/20/2023   PLT 271 12/20/2023   GLUCOSE 81 12/20/2023   CHOL 174 12/20/2023   TRIG 83 12/20/2023   HDL 61 12/20/2023   LDLCALC 86 12/20/2023   ALT 16 12/20/2023   AST 20 12/20/2023   NA 142 12/20/2023   K 4.4 12/20/2023   CL 100 12/20/2023   CREATININE 0.78 12/20/2023   BUN 9 12/20/2023   CO2 29 12/20/2023   TSH 1.800 11/18/2021   INR 1.0 12/20/2023   HGBA1C 6.8 (H) 07/22/2024     Assessment & Plan:  Type 2 diabetes mellitus without complication, without long-term current use of insulin (HCC) Assessment & Plan: Stable.  Continue metformin .   Encounter for chronic pain management Assessment & Plan: Chronic pain stable.  Medications refilled.  Will obtain urine drug screen at next visit.  Orders: -     HYDROcodone -Acetaminophen ; Take 1 tablet by mouth 3 (three) times daily as needed for moderate pain (pain score 4-6) or severe pain (pain score 7-10).  Dispense: 90 tablet; Refill: 0 -     HYDROcodone -Acetaminophen ; Take 1 tablet by mouth 3 (three) times daily as needed for moderate pain (pain score 4-6) or severe pain (pain score 7-10).  Dispense: 90 tablet; Refill: 0 -     HYDROcodone -Acetaminophen ; Take 1 tablet by mouth 3 (three) times daily as needed for severe pain (pain score 7-10) or moderate pain (pain score 4-6).  Dispense: 90 tablet; Refill: 0  OAB (overactive bladder) Assessment & Plan: Uncontrolled.  Starting oxybutynin .  Orders: -     oxyBUTYnin  Chloride ER; Take 1 tablet (5 mg total) by  mouth at bedtime.  Dispense: 90 tablet; Refill: 1    Follow-up: 3 months  Ginger Leeth Bluford DO [facility]

## 2024-09-25 NOTE — Assessment & Plan Note (Signed)
 Chronic pain stable.  Medications refilled.  Will obtain urine drug screen at next visit.

## 2024-09-26 ENCOUNTER — Other Ambulatory Visit: Payer: Self-pay | Admitting: Family Medicine

## 2024-09-26 MED ORDER — SOLIFENACIN SUCCINATE 10 MG PO TABS: 10.0000 mg | ORAL_TABLET | Freq: Every day | ORAL | 3 refills | Status: AC

## 2024-09-27 ENCOUNTER — Telehealth: Payer: Self-pay | Admitting: *Deleted

## 2024-09-27 NOTE — Telephone Encounter (Signed)
 Copied from CRM #[id number]. Topic: Clinical - Medication Question >> Sep 26, 2024  3:46 PM Hadassah PARAS wrote: Reason for CRM: Pt is req for PCP to change oxybutynin  (DITROPAN -XL) 5 MG 24 hr tablet To Vesicare. They went over this change in person the day of office visit. Vesicare is cheaper and does not have side effects like the other medication. Please advise pt on #[phone]

## 2024-10-08 ENCOUNTER — Other Ambulatory Visit: Payer: Self-pay | Admitting: Family Medicine

## 2024-10-13 ENCOUNTER — Other Ambulatory Visit: Payer: Self-pay | Admitting: Family Medicine

## 2024-10-23 ENCOUNTER — Other Ambulatory Visit: Payer: Self-pay | Admitting: Family Medicine

## 2024-10-24 ENCOUNTER — Telehealth: Payer: Self-pay | Admitting: Family Medicine

## 2024-10-24 ENCOUNTER — Other Ambulatory Visit: Payer: Self-pay | Admitting: Family Medicine

## 2024-10-24 MED ORDER — ALPRAZOLAM 0.5 MG PO TABS: ORAL_TABLET | ORAL | 2 refills | Status: AC

## 2024-10-24 NOTE — Telephone Encounter (Signed)
 Refill ALPRAZolam  (XANAX ) 0.5 MG tablet   [facility]

## 2024-11-12 ENCOUNTER — Other Ambulatory Visit: Payer: Self-pay | Admitting: Family Medicine

## 2024-11-12 DIAGNOSIS — E119 Type 2 diabetes mellitus without complications: Secondary | ICD-10-CM

## 2024-11-12 DIAGNOSIS — F32A Depression, unspecified: Secondary | ICD-10-CM

## 2024-12-23 ENCOUNTER — Ambulatory Visit: Admitting: Family Medicine
